# Patient Record
Sex: Female | Born: 1960 | Race: White | Hispanic: No | Marital: Single | Smoking: Never smoker
Health system: Southern US, Community
[De-identification: ages and names within clinical notes are randomized; demographics above are authoritative.]

## PROBLEM LIST (undated history)

## (undated) DIAGNOSIS — Z9989 Dependence on other enabling machines and devices: Secondary | ICD-10-CM

## (undated) DIAGNOSIS — Z87442 Personal history of urinary calculi: Secondary | ICD-10-CM

## (undated) DIAGNOSIS — G709 Myoneural disorder, unspecified: Secondary | ICD-10-CM

## (undated) DIAGNOSIS — R112 Nausea with vomiting, unspecified: Secondary | ICD-10-CM

## (undated) DIAGNOSIS — E669 Obesity, unspecified: Secondary | ICD-10-CM

## (undated) DIAGNOSIS — T8859XA Other complications of anesthesia, initial encounter: Secondary | ICD-10-CM

## (undated) DIAGNOSIS — K8020 Calculus of gallbladder without cholecystitis without obstruction: Principal | ICD-10-CM

## (undated) DIAGNOSIS — Z9889 Other specified postprocedural states: Secondary | ICD-10-CM

## (undated) DIAGNOSIS — M1990 Unspecified osteoarthritis, unspecified site: Secondary | ICD-10-CM

## (undated) DIAGNOSIS — R200 Anesthesia of skin: Secondary | ICD-10-CM

## (undated) HISTORY — DX: Calculus of gallbladder without cholecystitis without obstruction: K80.20

## (undated) HISTORY — DX: Obesity, unspecified: E66.9

## (undated) HISTORY — DX: Unspecified osteoarthritis, unspecified site: M19.90

---

## 2005-01-30 ENCOUNTER — Other Ambulatory Visit: Admission: RE | Admit: 2005-01-30 | Discharge: 2005-01-30 | Payer: Self-pay | Admitting: Obstetrics & Gynecology

## 2005-02-14 HISTORY — PX: OTHER SURGICAL HISTORY: SHX169

## 2005-03-18 ENCOUNTER — Encounter: Admission: RE | Admit: 2005-03-18 | Discharge: 2005-03-18 | Payer: Self-pay | Admitting: Obstetrics & Gynecology

## 2005-06-26 ENCOUNTER — Ambulatory Visit (HOSPITAL_COMMUNITY): Admission: RE | Admit: 2005-06-26 | Discharge: 2005-06-26 | Payer: Self-pay | Admitting: Obstetrics & Gynecology

## 2008-04-17 ENCOUNTER — Inpatient Hospital Stay (HOSPITAL_COMMUNITY): Admission: EM | Admit: 2008-04-17 | Discharge: 2008-04-21 | Payer: Self-pay | Admitting: Neurological Surgery

## 2008-04-17 HISTORY — PX: CERVICAL FUSION: SHX112

## 2008-04-19 ENCOUNTER — Ambulatory Visit: Payer: Self-pay | Admitting: Physical Medicine & Rehabilitation

## 2008-04-21 ENCOUNTER — Inpatient Hospital Stay (HOSPITAL_COMMUNITY)
Admission: RE | Admit: 2008-04-21 | Discharge: 2008-04-28 | Payer: Self-pay | Admitting: Physical Medicine & Rehabilitation

## 2008-06-14 ENCOUNTER — Encounter: Admission: RE | Admit: 2008-06-14 | Discharge: 2008-06-14 | Payer: Self-pay | Admitting: Neurological Surgery

## 2010-02-04 ENCOUNTER — Encounter
Admission: RE | Admit: 2010-02-04 | Discharge: 2010-02-04 | Payer: Self-pay | Source: Home / Self Care | Admitting: Neurosurgery

## 2010-03-05 DIAGNOSIS — M1990 Unspecified osteoarthritis, unspecified site: Secondary | ICD-10-CM

## 2010-03-05 HISTORY — DX: Unspecified osteoarthritis, unspecified site: M19.90

## 2010-07-02 LAB — BASIC METABOLIC PANEL
BUN: 14 mg/dL (ref 6–23)
BUN: 15 mg/dL (ref 6–23)
BUN: 20 mg/dL (ref 6–23)
BUN: 20 mg/dL (ref 6–23)
CO2: 23 mEq/L (ref 19–32)
CO2: 24 mEq/L (ref 19–32)
CO2: 26 mEq/L (ref 19–32)
CO2: 29 mEq/L (ref 19–32)
Calcium: 8.5 mg/dL (ref 8.4–10.5)
Calcium: 8.5 mg/dL (ref 8.4–10.5)
Calcium: 8.6 mg/dL (ref 8.4–10.5)
Calcium: 8.8 mg/dL (ref 8.4–10.5)
Chloride: 100 mEq/L (ref 96–112)
Chloride: 101 mEq/L (ref 96–112)
Chloride: 102 mEq/L (ref 96–112)
Chloride: 96 mEq/L (ref 96–112)
Creatinine, Ser: 0.46 mg/dL (ref 0.4–1.2)
Creatinine, Ser: 0.53 mg/dL (ref 0.4–1.2)
Creatinine, Ser: 0.58 mg/dL (ref 0.4–1.2)
Creatinine, Ser: 0.59 mg/dL (ref 0.4–1.2)
GFR calc Af Amer: >60 mL/min (ref >60)
GFR calc Af Amer: >60 mL/min (ref >60)
GFR calc Af Amer: >60 mL/min (ref >60)
GFR calc Af Amer: >60 mL/min (ref >60)
GFR calc non Af Amer: >60 mL/min (ref >60)
GFR calc non Af Amer: >60 mL/min (ref >60)
GFR calc non Af Amer: >60 mL/min (ref >60)
GFR calc non Af Amer: >60 mL/min (ref >60)
Glucose, Bld: 228 mg/dL — ABNORMAL HIGH (ref 70–99)
Glucose, Bld: 251 mg/dL — ABNORMAL HIGH (ref 70–99)
Glucose, Bld: 279 mg/dL — ABNORMAL HIGH (ref 70–99)
Glucose, Bld: 334 mg/dL — ABNORMAL HIGH (ref 70–99)
Potassium: 4.1 mEq/L (ref 3.5–5.1)
Potassium: 4.1 mEq/L (ref 3.5–5.1)
Potassium: 4.3 mEq/L (ref 3.5–5.1)
Potassium: 4.5 mEq/L (ref 3.5–5.1)
Sodium: 131 mEq/L — ABNORMAL LOW (ref 135–145)
Sodium: 133 mEq/L — ABNORMAL LOW (ref 135–145)
Sodium: 133 mEq/L — ABNORMAL LOW (ref 135–145)
Sodium: 136 mEq/L (ref 135–145)

## 2010-07-02 LAB — GLUCOSE, CAPILLARY
Glucose-Capillary: 123 mg/dL — ABNORMAL HIGH (ref 70–99)
Glucose-Capillary: 127 mg/dL — ABNORMAL HIGH (ref 70–99)
Glucose-Capillary: 139 mg/dL — ABNORMAL HIGH (ref 70–99)
Glucose-Capillary: 144 mg/dL — ABNORMAL HIGH (ref 70–99)
Glucose-Capillary: 149 mg/dL — ABNORMAL HIGH (ref 70–99)
Glucose-Capillary: 174 mg/dL — ABNORMAL HIGH (ref 70–99)
Glucose-Capillary: 201 mg/dL — ABNORMAL HIGH (ref 70–99)
Glucose-Capillary: 203 mg/dL — ABNORMAL HIGH (ref 70–99)
Glucose-Capillary: 213 mg/dL — ABNORMAL HIGH (ref 70–99)
Glucose-Capillary: 225 mg/dL — ABNORMAL HIGH (ref 70–99)
Glucose-Capillary: 236 mg/dL — ABNORMAL HIGH (ref 70–99)
Glucose-Capillary: 242 mg/dL — ABNORMAL HIGH (ref 70–99)
Glucose-Capillary: 243 mg/dL — ABNORMAL HIGH (ref 70–99)
Glucose-Capillary: 245 mg/dL — ABNORMAL HIGH (ref 70–99)
Glucose-Capillary: 250 mg/dL — ABNORMAL HIGH (ref 70–99)
Glucose-Capillary: 251 mg/dL — ABNORMAL HIGH (ref 70–99)
Glucose-Capillary: 268 mg/dL — ABNORMAL HIGH (ref 70–99)
Glucose-Capillary: 276 mg/dL — ABNORMAL HIGH (ref 70–99)
Glucose-Capillary: 277 mg/dL — ABNORMAL HIGH (ref 70–99)
Glucose-Capillary: 280 mg/dL — ABNORMAL HIGH (ref 70–99)
Glucose-Capillary: 290 mg/dL — ABNORMAL HIGH (ref 70–99)
Glucose-Capillary: 292 mg/dL — ABNORMAL HIGH (ref 70–99)
Glucose-Capillary: 293 mg/dL — ABNORMAL HIGH (ref 70–99)
Glucose-Capillary: 307 mg/dL — ABNORMAL HIGH (ref 70–99)
Glucose-Capillary: 310 mg/dL — ABNORMAL HIGH (ref 70–99)
Glucose-Capillary: 324 mg/dL — ABNORMAL HIGH (ref 70–99)
Glucose-Capillary: 329 mg/dL — ABNORMAL HIGH (ref 70–99)
Glucose-Capillary: 399 mg/dL — ABNORMAL HIGH (ref 70–99)

## 2010-07-02 LAB — CBC
HCT: 43.1 % (ref 36.0–46.0)
Hemoglobin: 14.8 g/dL (ref 12.0–15.0)
Hemoglobin: 16.1 g/dL — ABNORMAL HIGH (ref 12.0–15.0)
MCHC: 33.7 g/dL (ref 30.0–36.0)
MCHC: 34.1 g/dL (ref 30.0–36.0)
MCHC: 34.3 g/dL (ref 30.0–36.0)
MCV: 82.5 fL (ref 78.0–100.0)
MCV: 83.1 fL (ref 78.0–100.0)
MCV: 84.1 fL (ref 78.0–100.0)
Platelets: 237 10*3/uL (ref 150–400)
Platelets: 268 10*3/uL (ref 150–400)
RBC: 5.22 MIL/uL — ABNORMAL HIGH (ref 3.87–5.11)
RBC: 5.64 MIL/uL — ABNORMAL HIGH (ref 3.87–5.11)
RBC: 5.87 MIL/uL — ABNORMAL HIGH (ref 3.87–5.11)
RDW: 12.8 % (ref 11.5–15.5)
RDW: 13.2 % (ref 11.5–15.5)
RDW: 13.4 % (ref 11.5–15.5)
RDW: 13.4 % (ref 11.5–15.5)
WBC: 16.5 10*3/uL — ABNORMAL HIGH (ref 4.0–10.5)
WBC: 17.8 10*3/uL — ABNORMAL HIGH (ref 4.0–10.5)

## 2010-07-02 LAB — URINALYSIS, ROUTINE W REFLEX MICROSCOPIC
Bilirubin Urine: NEGATIVE
Glucose, UA: >1000 mg/dL — AB
Hgb urine dipstick: NEGATIVE
Ketones, ur: >80 mg/dL — AB
Ketones, ur: NEGATIVE mg/dL
Ketones, ur: NEGATIVE mg/dL
Leukocytes, UA: NEGATIVE
Nitrite: NEGATIVE
Nitrite: NEGATIVE
Protein, ur: NEGATIVE mg/dL
Protein, ur: NEGATIVE mg/dL
Specific Gravity, Urine: 1.03 (ref 1.005–1.030)
Urobilinogen, UA: 0.2 mg/dL (ref 0.0–1.0)
Urobilinogen, UA: 0.2 mg/dL (ref 0.0–1.0)
pH: 5.5 (ref 5.0–8.0)

## 2010-07-02 LAB — COMPREHENSIVE METABOLIC PANEL
ALT: 11 U/L (ref 0–35)
ALT: 17 U/L (ref 0–35)
AST: 24 U/L (ref 0–37)
AST: 31 U/L (ref 0–37)
Albumin: 3.3 g/dL — ABNORMAL LOW (ref 3.5–5.2)
CO2: 23 mEq/L (ref 19–32)
Calcium: 8.8 mg/dL (ref 8.4–10.5)
Calcium: 8.9 mg/dL (ref 8.4–10.5)
Creatinine, Ser: 0.58 mg/dL (ref 0.4–1.2)
Creatinine, Ser: 0.65 mg/dL (ref 0.4–1.2)
GFR calc Af Amer: >60 mL/min (ref >60)
GFR calc Af Amer: >60 mL/min (ref >60)
GFR calc non Af Amer: >60 mL/min (ref >60)
GFR calc non Af Amer: >60 mL/min (ref >60)
Sodium: 133 mEq/L — ABNORMAL LOW (ref 135–145)
Sodium: 134 mEq/L — ABNORMAL LOW (ref 135–145)
Total Protein: 6.6 g/dL (ref 6.0–8.3)
Total Protein: 6.8 g/dL (ref 6.0–8.3)

## 2010-07-02 LAB — DIFFERENTIAL
Eosinophils Absolute: 0 10*3/uL (ref 0.0–0.7)
Eosinophils Relative: 0 % (ref 0–5)
Lymphocytes Relative: 17 % (ref 12–46)
Lymphs Abs: 2.1 10*3/uL (ref 0.7–4.0)
Lymphs Abs: 2.7 10*3/uL (ref 0.7–4.0)
Monocytes Absolute: 0.7 10*3/uL (ref 0.1–1.0)
Monocytes Relative: 4 % (ref 3–12)
Monocytes Relative: 6 % (ref 3–12)
Neutro Abs: 14.9 10*3/uL — ABNORMAL HIGH (ref 1.7–7.7)
Neutrophils Relative %: 83 % — ABNORMAL HIGH (ref 43–77)

## 2010-07-02 LAB — URINE CULTURE
Colony Count: NO GROWTH
Culture: NO GROWTH

## 2010-07-02 LAB — HEMOGLOBIN A1C: Hgb A1c MFr Bld: 11.7 % — ABNORMAL HIGH (ref 4.6–6.1)

## 2010-07-02 LAB — URINE MICROSCOPIC-ADD ON

## 2010-07-02 LAB — APTT: aPTT: 26 seconds (ref 24–37)

## 2010-07-02 LAB — POCT I-STAT 4, (NA,K, GLUC, HGB,HCT)
HCT: 46 % (ref 36.0–46.0)
Hemoglobin: 15.6 g/dL — ABNORMAL HIGH (ref 12.0–15.0)

## 2010-07-02 LAB — PROTIME-INR: Prothrombin Time: 13.9 seconds (ref 11.6–15.2)

## 2010-07-30 NOTE — H&P (Signed)
[name], [name]              ACCOUNT NO.:  [id number]   MEDICAL RECORD NO.:  [id number]          PATIENT TYPE:  INP   LOCATION:  3108                         FACILITY:  [facility]   PHYSICIAN:  Ranelle Oyster, M.D.DATE OF BIRTH:  Dec 05, 1960   DATE OF ADMISSION:  04/20/2008  DATE OF DISCHARGE:                              HISTORY & PHYSICAL   CHIEF COMPLAINTS:  Weakness and history of falls.   PRIMARY CARE PHYSICIAN:  Jeannett Senior A. [place], MD   NEUROSURGEON:  Hewitt Shorts, MD   HISTORY OF PRESENT ILLNESS:  This is a 50 year old right-handed white  female with history of falls for about 2 weeks.  She noted persistent  back pain and progressive bilateral upper and lower extremity weakness.  MRI showed a large disk herniation at C5-C6 with compression of cord as  well as severe multifactorial stenosis.  Also of note was a T5-T6 disk  herniation with mild cord compression.  The patient underwent C5-C6 ACDF  on April 17, 2008, for the above by Dr. Newell Coral.  A soft cervical  collar was applied.  She is placed on IV Decadron with a plan to taper.  Hospital course has been complicated by elevated sugars related to  steroids.  Her hemoglobin A1c was 11.7 as well.  She is placed on Lantus  insulin b.i.d. and has been followed by endocrinology.  Conservative  care is recommended for thoracic disk herniation for now depending on  her course.  Rehab was consulted and felt the patient could benefit from  inpatient rehab admission and thus the patient was admitted today.   REVIEW OF SYSTEMS:  Notable for weakness and low-back pain.  She also  reports numbness in the hands and legs.  She has had improving strength.  She has a Foley catheter in.  She has had flatus, but no bowel movements  yet.  Denies shortness of breath, chest pain, etc..   PAST MEDICAL HISTORY:  Notable for morbid obesity.  She denies alcohol  or tobacco abuse.   FAMILY HISTORY:  Positive diabetes.   SOCIAL  HISTORY:  The patient lives with her mother in a basement  apartment of her mother's home.  Mother works Monday to Friday 8 to 5.  Local family work.  The patient works for [employer].   ALLERGIES:  PENICILLIN.   HOME MEDICATIONS:  None.   LABORATORY DATA:  Hemoglobin 15.6, white count 13, and platelets 235.  Sodium 133, potassium 4.1, BUN 20, creatinine 0.4.   PHYSICAL EXAMINATION:  VITAL SIGNS:  Blood pressure 128/70, pulse is 80,  respiratory rate is 18, and temperature 98.  GENERAL:  The patient is generally pleasant in no acute distress.  HEENT:  Pupils equally round and reactive to light.  Ear, nose, and  throat exam is essentially unremarkable.  Dentition notable for black-  stained teeth, which are apparently due to tetracycline use as a child.  NECK:  Supple without JVD or lymphadenopathy.  CHEST:  Clear to auscultation bilaterally without wheezes, rales, or  rhonchi.  HEART:  Regular rate and rhythm without murmurs, rubs, or gallops.  EXTREMITIES:  No clubbing, cyanosis, or trace edema.  The patient is  morbidly obese.  ABDOMEN:  Soft, nontender.  Bowel sounds are positive.  SKIN:  Generally intact with a healed wound at the anterior neck.  Cervical collar is in place.  NEUROLOGIC:  Cranial nerves II through XII were normal.  The patient has  intact swallow and speech.  Reflexes were brisk at 2+ in the upper  extremities, 2+ lower extremities today.  Sensation is decreased to 1/2  right slightly more than left.  Strength is 4/5 right upper extremity,  4+/5 left upper extremity.  Bilateral lower extremities are 2+ to 3/5  proximal, 4/5 distally.  Judgment, orientation, memory, and mood were  all within functional limits.   POST-ADMISSION PHYSICIAN EVALUATION:  1. 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  2. The patient is admitted to receive collaborative interdisciplinary      care  between the physiatrist, rehab nursing staff, and therapy      team.  3. The patient's level of medical complexity and substantial therapy      needs in context of that medical necessity cannot be provided at a      lesser intensity of care.  4. The patient has experienced substantial functional loss from her      baseline.  Upon functional assessment at that time of her      preadmission screening yesterday, the patient was min assist for      bed mobility and transfers and ambulated 40 feet at mod assist.      Upon therapy evaluation today, the patient was mod assist upper      body dressing, mod assist toileting, mod assist transfer sit to      stand from the bed, and min assist stand to sit transfers to the      recliner.  She will walk 80 feet with the rolling walker at min      assist.  Judging by the patient's diagnosis, physical exam, and      functional history, the patient has displayed the ability to make      functional progress, which will result in measurable gains while in      inpatient rehab.  These gains will be of substantial and practical      use upon discharge to home where she will need to be somewhat      independent with her mobility and self-care.  5. Physiatrist will provide 24-hour management of medical needs as      well as oversight of therapy plan/treatment and provide guidance as      appropriate regarding interaction of the two.  Medical problem list      is listed below.  6. A 24-hour rehab nursing will assist in the management of the      patient's bowel and bladder function.  They will remove Foley      catheter upon transfer to rehab.  Nursing will also help integrate      therapy concepts, techniques, education, etc.  7. PT will assess and treat for lower extremity strengthening,      balance, appropriate adaptive equipment, and safety.  Goals are      modified independent.  8. OT will assess and treat for upper extremity use, dexterity of       hands, strengthening of the upper extremity, adaptive equipment,      assessment and treatment  as well as family education with goals at      supervision to modified independent.  9. Case management and social worker will assess and treat for      psychosocial issues and discharge planning.  10.Team conference will be held weekly to assess progress towards      goals and to determine barriers to discharge.  11.The patient has demonstrated sufficient medical stability and      exercise capacity to tolerate at least 3 hours therapy per day at      least 5 days per week.  12.Estimated length of stay is approximately 1 week.  Prognosis is      good.   MEDICAL PROBLEM LIST:  1. Potential neurogenic bladder.  Discontinue Foley catheter.  Begin      voiding trial.  Check PVRs and catheterize as needed.  The patient      is at further risk for neurogenic bladder considering her diabetes,      which has been poorly controlled.  2. Diabetes:  Continue with Lantus insulin for now per Endocrine      recommendations and adjust appropriately.  Check CBGs a.c. and at      bedtime.  3. Pain control:  Continue Vicodin p.r.n. for now.  This seems to be      under somewhat good control at the moment.  She does have the      possibility of developing spasms and will have antispasmodics on      board as needed.  4. DVT prophylaxis with subcu Lovenox 40 daily for now until mobility      improves a bit more.      Ranelle Oyster, M.D.  Electronically Signed     ZTS/MEDQ  D:  04/20/2008  T:  04/21/2008  Job:  [id number]

## 2010-07-30 NOTE — H&P (Signed)
NAMEALBINA, [name]              ACCOUNT NO.:  [id number]   MEDICAL RECORD NO.:  [id number]          PATIENT TYPE:  INP   LOCATION:  3108                         FACILITY:  [facility]   PHYSICIAN:  Stefani Dama, M.D.  DATE OF BIRTH:  [birth year]   DATE OF ADMISSION:  04/17/2008  DATE OF DISCHARGE:                              HISTORY & PHYSICAL   ADMITTING DIAGNOSIS:  Paraparesis.   HISTORY OF PRESENT ILLNESS:  [name] is a 50 year old right-  handed white female, who noted the sudden onset of bilateral lower  extremity weakness yesterday afternoon after bathing.  She went to get  up after a short nap around 5 or 6 in the evening and noted weakness in  her lower extremities such that her legs felt that they would buckle,  her right leg distinctly felt weaker.  She denied any pain in her back  and she felt a strange sensation of numbness in her legs that radiated  from the mid level of her abdomen on down.  She summoned EMS and was  taken to [facility] Emergency Room where a CT scan showed spinal  stenosis.  The patient's mother requested neurosurgical evaluation with  Dr. Newell Coral, she is a former patient of his.  Now is contacted  approximately 1 a.m. by Dr. Littie Deeds indicating the patient's desire to be  transferred to [facility] for specific treatment and further  workup.  Prior to the transfer, the patient had a Foley catheter placed,  which drained 1600 mL of urine.  Rectal exam by Dr. Littie Deeds and per Dr.  Littie Deeds revealed normal tone in the rectum and a per the patient's  comment, she noted normal sensation in the perirectal area.   The patient denied any significant associated symptoms other than noting  that she had had a fall about 2 weeks ago.  She fell forward at that  time and did experience some slight back pain, but did not note anything  specific about that, her strength at that time was normal.  The patient  denies any travel.  She denies any  infectious processes that are  recently passed such as a bad cold or fever she notes that about a month  and a half ago.  Just before Christmas, she had had a significant cold,  but this only produced respiratory and upper respiratory symptoms.  The  patient also denies the onset of symptoms as going from distal to  proximal.  She notes that the change was rather sudden.  She did perhaps  get some sensation difference before her bath yesterday.  However, she  notes that she did not think anything of it and the change came about  rather suddenly when she tried to get up after a nap.   PAST MEDICAL HISTORY:  She is healthy with morbid obesity, and states  her height of 5 feet 3 and 280 pounds.   ALLERGIES:  She notes an allergy to PENICILLIN, which she tells me, was  told to her after she failed treatment with penicillin-like drug for  urinary tract infection.  She  denied any rash or fevers, but she was  told never to take penicillin again.   MEDICATIONS:  She is on no chronic medications.   SOCIAL HISTORY:  She is single.  She lives in an apartment in her  mother's house.  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   REVIEW OF SYSTEMS:  Negative for an 14 different systems checked and  discussed with the patient.   PHYSICAL EXAMINATION:  VITAL SIGNS:  At the current time, blood pressure  is 130/72, pulse rate 66, respirations are 20, temperature is 98.  GENERAL:  She is alert and oriented.  HEENT:  Her pupils are 3 mm equal, brisk, and reactive to light and  accommodation.  Extraocular movements are full.  Face is symmetric to  sensation.  Tongue and uvula are in the midline.  NECK:  Supple.  No masses are noted.  LUNGS:  Clear to auscultation.  HEART:  Regular rate and rhythm.  No murmurs noted.  ABDOMEN:  Soft.  Bowel sounds are positive.  No masses are noted.  EXTREMITIES:  No cyanosis, clubbing, or edema.  There is some tenderness  in the lower  lumbar spine approximately level of L3 on down.  This is  both to palpation and slightly to percussion.  On motor examination, her  upper extremity strength is normal in all the groups tested.  Her lower  extremities reveals that there is some weakness in the iliopsoas and the  quad graded at -4/5 on the right side and there is some slight weakness  suggested in the tibialis anterior and the right side being graded 4+/5,  gastroc strength is 5/5.  Left lower extremity strength is 5/5 in the  iliopsoas, quad tibialis anterior, and gastrocs.  Sensation to pin is  decreased in the L4, L5, and S1 distributions on the left lower  extremity.  She complains of some diffuse decrease in sensation below  the level of the thoracic spine over the entirety of the abdomen left  and right, although she does have pin sensation intact.  Her reflexes  are 2+ in the biceps, 1+ in both triceps, 2+ in the patella, trace in  the Achilles, Babinski reflexes appeared downgoing at this time.   IMPRESSION:  1. 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  This is suggestion of T-spine      sensory level of T10 with a mild decrease in sensation of both      sides of the abdomen on down.  2. The patient has evidence of bladder dysfunction with retention of      urine of 1600 mL.  3. The patient has possibly new onset of diabetes mellitus with a      glucose noted of 399 after 10 mg of Decadron.   PLAN:  1. Plan at the current time will be to admit the patient for further      workup with an MRI of the lumbar and the thoracic spines.  2. Maintain a Foley catheter for urinary drainage.  3. Workup of diabetes with HbA1c and provide insulin coverage.      Stefani Dama, M.D.  Electronically Signed     HJE/MEDQ  D:  04/17/2008  T:  04/17/2008  Job:  [id number]

## 2010-07-30 NOTE — Discharge Summary (Signed)
[name], [name]              ACCOUNT NO.:  [id number]   MEDICAL RECORD NO.:  [id number]          PATIENT TYPE:  IPS   LOCATION:  4006                         FACILITY:  [facility]   PHYSICIAN:  Ranelle Oyster, M.D.DATE OF BIRTH:  01-Feb-1961   DATE OF ADMISSION:  04/21/2008  DATE OF DISCHARGE:  04/28/2008                               DISCHARGE SUMMARY   DISCHARGE DIAGNOSES:  1. 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  2. 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  3. Neurogenic bladder.  4. New onset diabetes mellitus.  5. Morbid obesity.  6. Pain management.  7. Subcutaneous Lovenox for deep vein thrombosis prophylaxis.   This is a 50 year old right-handed white female with history of fall 2  weeks ago and fell forward with no loss of consciousness.  Noted  persistent back pain and progressive bilateral lower extremity weakness.  Admitted on April 17, 2008.  MRI and imaging showed a large disk  herniation cervical C5-6 with cord compression as well as severe  stenosis.  Also, thoracic T5-6 disk herniation with mild cord  compression.  Underwent cervical C5-6 ACDF on April 17, 2008, for disk  herniation and myelopathy per Dr. Newell Coral.  Soft cervical collar was  applied.  Placed on intravenous Decadron.   HOSPITAL COURSE:  Blood sugars 293, 324, 280.  Hemoglobin A1c of 11.7,  placed on Lantus insulin as well as consultation to Endocrine Services,  Dr. Evlyn Kanner, for assistance in new onset diabetes mellitus.  Monitoring of  thoracic T5-6 disk herniation with conservative care may need surgical  intervention in the future per Neurosurgery.  The patient was admitted  for comprehensive rehab program.   PAST MEDICAL HISTORY:  See discharge diagnoses.  No alcohol or tobacco.   ALLERGIES:  Penicillin.   SOCIAL HISTORY:  Lives with mother in  basement apartment of mother's  home.  Mother works Monday to Friday 8-5.  Other local family works.  The patient works for [employer].   FUNCTIONAL HISTORY:  Prior to admission, was independent.  Functional  status upon admission to rehab services; moderate assist for upper body  dressing, moderate assist toileting, transfers sit to stand, moderate  assist, standing to sitting requires minimal assist, ambulating minimal  assist 80 feet with a rolling walker.   MEDICATION PRIOR TO ADMISSION:  None.   PHYSICAL EXAMINATION:  VITAL SIGNS:  Blood pressure 128/70, pulse 80,  temperature 98, respirations 18.  GENERAL:  This was an alert female in no acute distress, oriented x3.  MUSCULOSKELETAL:  Deep tendon reflexes were hypoactive.  Sensation  decreased to light touch, right greater than left between the shoulders.  Soft cervical collar intact.  Surgical site healing nicely.  Calves  remained cool without any swelling or erythema, nontender.  LUNGS:  Clear to auscultation.  CARDIAC:  Regular rate and rhythm.  ABDOMEN:  Soft, nontender.  Good bowel sounds.   REHABILITATION HOSPITAL COURSE:  The patient was admitted to inpatient  rehab services with therapies initiated on a 3-hour daily basis  consisting of physical therapy, occupational therapy, and rehabilitation  nursing.  The following issues were addressed during the patient's  rehabilitation stay:  Pertaining to Ms. Brodowski's cervical HNP, cord  compression myelopathy, she underwent ACDF, cervical C5-6 on April 17, 2008, per Dr. Newell Coral as well as conservative care and monitoring of a  thoracic T5-6 disk herniation with mild cord compression.  She continued  to participate fully with her therapies, progressing nicely in all  areas.  Pain management ongoing with the use of Vicodin and good  results.  Decadron taper ongoing as dictated per Neurosurgery.  Her  Foley catheter tube had been removed.  She initially had  some increased  postvoid residuals.  These continued to improve as her mobility  improved.  She denied any dysuria or hematuria.  New onset of diabetes  mellitus assistance as per Endocrine Services, Dr. Evlyn Kanner.  Full diabetic  teaching was completed.  She remained on Lantus insulin 50 units twice  daily.  This would need to be adjusted as her Decadron taper was  completed.  She remained on subcutaneous Lovenox for deep vein  thrombosis prophylaxis throughout her rehab course.  Her blood pressures  were well controlled without the use of antihypertensive medication.   Weekly collaborative interdisciplinary team conferences were held to  discuss the patient's estimated length of stay, ongoing family teaching,  and any barriers to discharge.  She was overall supervision to minimal  assist for her mobility, supervision overall for activities of daily  living, moderate assist for bathing.  She exhibited no unsafe behavior.  She was continent of bowel and bladder.  A soft cervical collar remained  in place as per Neurosurgery.  She was allowed to remove this for her  bathing.   Latest labs showed a hemoglobin of 16.1, hematocrit of 42, monitoring of  white blood cell count of 17.4 secondary to steroids.  Chemistries were  unremarkable.  She was discharged to home.   Discharge medications included:  1. Protonix 40 mg daily until Decadron taper completed.  2. Aspirin 81 mg daily.  3. Lantus insulin 50 units subcutaneously twice daily.  4. Decadron taper as per Neurosurgery to be completed on May 07, 2008.  5. Vicodin 5/325 one or 2 tablets every 4 hours as needed for pain,      dispense of 90 tablets.   Her diet was diabetic diet.   SPECIAL INSTRUCTIONS:  No drinking, no driving, no smoking.  Continue  soft cervical collar as advised per Neurosurgery.  Follow up Dr. Faith Rogue at the outpatient rehab service office as advised; Dr. Newell Coral,  Neurosurgery [phone]; and Dr.  Evlyn Kanner, Endocrine Services for new onset  diabetes mellitus.      Mariam Dollar, P.A.      Ranelle Oyster, M.D.  Electronically Signed    DA/MEDQ  D:  04/27/2008  T:  04/27/2008  Job:  [id number]   cc:   Jeannett Senior A. Evlyn Kanner, M.D.  Hewitt Shorts, M.D.

## 2010-07-30 NOTE — Discharge Summary (Signed)
[name], [name]              ACCOUNT NO.:  [id number]   MEDICAL RECORD NO.:  [id number]          PATIENT TYPE:  INP   LOCATION:  3108                         FACILITY:  [facility]   PHYSICIAN:  Hewitt Shorts, M.D.DATE OF BIRTH:  10/18/1960   DATE OF ADMISSION:  04/17/2008  DATE OF DISCHARGE:  04/21/2008                               DISCHARGE SUMMARY   ADMISSION HISTORY AND PHYSICAL EXAMINATION:  The patient is a 50-year-  old right-handed white female who was accepted and transferred from  [facility] Emergency Room by Dr. Barnett Abu, with acute  paraparesis with a Brown-Sequard syndrome.  Dr. Danielle Dess evaluated her and  felt that she had a thoracic sensory level.  She had a evidence of  bladder dysfunction and had a Foley placed in the [facility]  Emergency Room for 1600 mL.  Her blood sugar when EMS picked her up at  home was 319 and was 399 in the [facility] emergency room.   She reports having fallen about 2 weeks prior to presentation.  Following the fall, she had increased cramping in her legs and numbness  and tingling in her feet at night.  However on the day of presentation,  she took a shower and then a nap and when she got up from the nap, she  fell to the floor with weakness in her lower extremities.  EMS was  called, and she was taken to the emergency room.  A CT was done of the  lumbar spine.  However, the information from that was quite limited, and  the patient was transferred from [facility] to [facility].   The patient was evaluated by Dr. Danielle Dess.  General examination was  notable for morbid obesity.  He found weakness in the right lower  extremity greater than the left lower extremity with sensory deficit  greater in the left lower extremity than in the right lower extremity  consistent with Brown-Sequard syndrome.  He found a thoracic sensory  level that he estimated T10.   HOSPITAL COURSE:  The patient was admitted  by Dr. Danielle Dess.  He asked me  to assist in her management and to assume her care.  He obtained MRI  scans of the thoracic and lumbar spine.  The lumbar spine showed  degenerative changes, but no significant neural compression.  The  thoracic spine showed a significant disk herniation at T5-6 with some  cord compression, but no alteration of cord signal, but with multiple  other levels in the thoracic spine with significant degenerative changes  as well.  However, the scout films showed what appeared to be a  significant C5-6 cervical disk herniation, Dr. Danielle Dess requested an MRI  scan of the cervical spine.   I assessed the patient at his request, the patient was started on  Decadron at [facility] Emergency Room and continued on Decadron.  Subsequent to transfer, she was found to have significant hyperglycemia  and was started on sliding scale insulin schedule.  My examination  revealed a quadriparesis with weakness in the right triceps and an  intrinsics and grips  bilaterally, worse in the right than the left, with  proximal right lower extremity weakness as well and diminished sensation  of the distal left lower extremity and an incomplete sensory level at  approximately T8.  She had hyperreflexia at the quadriceps and sustained  clonus at the right ankle with upgoing toes bilaterally.   We subsequently reviewed her cervical MRI it showed a large C5-6  cervical disk herniation with spinal cord compression worse on the right  than the left with increased signal in the spinal cord consistent with  myelopathy.   In the end it was felt that she might well be symptomatic both from the  C5-6 cervical disk herniation as well as from the T5-6 thoracic disc  herniation.  However, it appeared that the compression was worse at C5-6  than at T5-6, and there was definite increased signal in the spinal cord  at C5-6 and questionable increased signal in the cord at T5-6 and the  exam was  notable for quadriparesis.  Therefore, it was recommended that  we proceed with decompression of the spinal cord at the C5-6 level via  single level, C5-6 anterior cervical decompression and arthrodesis  understanding that she might subsequently require thoracotomy for T5-6  diskectomy, and we discussed at length the nature of her condition and  the nature of proposed surgery the risks of the procedure and the risks  of going without surgery.  The patient wished to go ahead with surgery  and was taken on the evening of her admission to surgery for a single-  level C5-6 anterior cervical decompression arthrodesis with allograft  and cervical plating.  Postoperatively, she continued to have  significant hyperglycemia despite the sliding scale.  She did not have a  primary physician prior to hospitalization and asked that I consult a  primary physician in [place] to assist with her general medical care  as well as her diabetic management, and I consulted with Dr. Adrian Prince, he agreed to see the patient in consultation in the hospital and  has continued to follow her subsequently, and he has been assisting with  her diabetic management in the hospital, and her blood sugar control has  gradually improved such that on the day of transfer to the  rehabilitation center her blood sugar in the morning was 225 which is  the best we had since hospitalization.  Neurologically, she has had  improvement with increasing strength in the upper extremities and lower  extremities.   Physical Therapy and Occupational Therapy have been consulted and they  have been working with the patient; however, she still has significant  limitations and we therefore consulted Physical Medicine and  Rehabilitation about comprehensive inpatient rehabilitation.  They felt  that she was an appropriate candidate for inpatient rehabilitation and  after extensive discussions with her [facility] carrier,   inpatient rehabilitation was approved, and the patient is to be  transferred today to the Rehabilitation Center.   Her wound is healing nicely, there is no erythema, swelling, or  drainage.  She continued to have Foley in place and the plan will be for  the Rehabilitation Service to work on bladder training.  Dr. Evlyn Kanner and  his partners will continue to assist with her medical and diabetic  management.  Notably, her hemoglobin A1c at the time of admission was  11.7 indicating long-term chronic hyperglycemia consistent with diabetes  mellitus previously not diagnosed.  The patient is being transferred to  the Rehabilitation  Center.  She has been started on Lovenox 40 mg subcu  q.12 h.  She is eating well.  Her IV has been tapered off.   DISCHARGE DIAGNOSES:  1. Acute cervical myelopathy with quadriparesis secondary to an acute      C5-6 cervical disk herniation with significant spinal cord      compression with underlying spondylosis at that level with      increased signal seen in the spinal cord at that level consistent      with her myelopathy with quadriparesis and urinary retention.  2. Also significant T5-6 thoracic disk herniation with spinal cord      compression, although questionable increased signal in the spinal      cord at that level.  She does have an incomplete sensory level      approximately at T8.  3. Diabetes mellitus, new diagnosis, currently under the evaluation,      treatment, and care of Dr. Adrian Prince  4. Morbid obesity.      Hewitt Shorts, M.D.  Electronically Signed     RWN/MEDQ  D:  04/21/2008  T:  04/22/2008  Job:  [id number]   cc:   Jeannett Senior A. Evlyn Kanner, M.D.

## 2010-07-30 NOTE — Op Note (Signed)
[name], [name]              ACCOUNT NO.:  [id number]   MEDICAL RECORD NO.:  [id number]          PATIENT TYPE:  INP   LOCATION:  3108                         FACILITY:  [facility]   PHYSICIAN:  Hewitt Shorts, M.D.DATE OF BIRTH:  1960/12/26   DATE OF PROCEDURE:  04/17/2008  DATE OF DISCHARGE:                               OPERATIVE REPORT   PREOPERATIVE DIAGNOSES:  1. 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  2. 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  3. Cervical stenosis and quadriparesis.   POSTOPERATIVE DIAGNOSES:  1. 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  2. 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  3. Cervical stenosis and quadriparesis.   PROCEDURE:  C5-6 anterior cervical decompression arthrodesis with  allograft and Tether cervical plating.   SURGEON:  Hewitt Shorts, MD   ASSISTANTS:  Danae Orleans. Venetia Maxon, M.D.   ANESTHESIA:  General endotracheal.   INDICATION:  The patient is a 50 year old woman who presented with  quadriparesis.  She underwent an extensive workup including MRI of  cervical, thoracic, and lumbar spine.  She was found to have large disk  herniation at C5-6 with asymmetric compression of the spinal cord, worse  on the right than the left, increasing within the spinal cord and severe  spinal cord compression and cervical stenosis.  She was also found to  have a significant disk herniation at T5-6 with spinal cord compression  but not as severe as that she had at C5-6 and therefore decision was  made to proceed with a single level anterior cervical decompression  arthrodesis.   PROCEDURE:  The patient was brought to the operating room and placed  under general endotracheal anesthesia.  The patient was placed in 10  pounds of Halter traction and then the neck was prepped with Betadine  soap and solution, draped in a sterile fashion.  A horizontal incision  was made on the left side of the neck.  The line of the incision was  infiltrated with local anesthetic with epinephrine.  An incision made,  carried down through the subcutaneous tissue, bipolar cautery and  electrocautery were used to maintain hemostasis.  Dissection was carried  through the platysma and then through the avascular plane leaving the  sternocleidomastoid, carotid artery, and jugular vein laterally, and the  trachea and esophagus medially.  The ventral aspect of the vertebral  column was identified and localizing x-rays were taken with 2 spinal  needles.  With these needles, we were able to identify the C3-4 and C4-5  levels and thereby we were  able to dissect caudally and identify the C5-  6 intervertebral disk space level.   The microscope was then draped and brought into the field to provide  additional magnification, illumination, and visualization and we  proceeded with the C5-6 anterior cervical decompression.  By incising  the annulus, we continued entering into the disk space using micro  curettes and pituitary rongeurs, a thorough diskectomy was performed.  Anterior osteophytic overgrowth was removed using Kerrison punches, the  osteophyte removal tool, and X-Max drill.  There was significant  spondylitic degeneration.  The disk was quite  degenerated and the  cauterized endplates of the vertebral body were removed using the X-Max  drill along with micro curettes and dissection was carried out  posteriorly with a significant spondylitic spurring.  This was removed  using X-Max drill and a 2-mm Kerrison punch with a thin foot plate.  Soft as well as hard spondylitic cervical disk herniation was  encountered and this was carefully removed to decompress the thecal sac  and spinal canal.  We carefully removed the spondylotic overgrowth and  decompressed the thecal sac and spinal canal.  We turned our attention  to the neural foramina, which was similarly decompressed bilaterally.  Once the decompression was completed, hemostasis was  established with  the use of Gelfoam soaked in thrombin.  We measured the height of the  intervertebral disk space and selected an 8-mm implant.  The allograft  implant was hydrated with saline solution, then positioned in the  intervertebral disk space, and countersunk.   We then discontinued the cervical traction and proceeded with the  plating.  We selected a 14-mm Tether cervical plate, it was positioned  over the construct and secured with a pair of 4 x 13-mm variable angle  screws at each level.  The screw hole was drilled and the screws placed  in alternating fashion.  Once all 4 screws were in place, final  tightening was performed.  Visualization because of the patient's large-  sized shoulders was obscured.  We visualized the construct under direct  visualization.  Bone graft was secured in position, plate and screws are  all in good position.  The wound was irrigated with Bacitracin solution.  We checked for hemostasis, which was established and confirmed, and then  we proceeded with closure.  Platysma was closed with interrupted  inverted 2-0 undyed Vicryl sutures.  The subcutaneous and subcuticular  were closed with interrupted inverted 3-0 undyed Vicryl sutures.  Skin  edges were reapproximated with Dermabond.  The procedure was tolerated  well.  The estimated blood loss was 25 mL.  Sponge and needle count were  correct.  Following surgery, the patient is to be reversed from the  anesthetic, extubated, and transferred to the recovery room or in  Neurosurgical Intensive Care for further care.      Hewitt Shorts, M.D.  Electronically Signed     RWN/MEDQ  D:  04/17/2008  T:  04/19/2008  Job:  [id number]

## 2011-05-31 ENCOUNTER — Encounter (HOSPITAL_COMMUNITY): Payer: Self-pay | Admitting: *Deleted

## 2011-05-31 ENCOUNTER — Emergency Department (HOSPITAL_COMMUNITY): Payer: BC Managed Care – PPO

## 2011-05-31 ENCOUNTER — Emergency Department (HOSPITAL_COMMUNITY)
Admission: EM | Admit: 2011-05-31 | Discharge: 2011-05-31 | Disposition: A | Discharge status: Home / Self Care | Payer: BC Managed Care – PPO | Attending: Emergency Medicine | Admitting: Emergency Medicine

## 2011-05-31 ENCOUNTER — Other Ambulatory Visit: Payer: Self-pay

## 2011-05-31 DIAGNOSIS — R10816 Epigastric abdominal tenderness: Secondary | ICD-10-CM | POA: Insufficient documentation

## 2011-05-31 DIAGNOSIS — K8020 Calculus of gallbladder without cholecystitis without obstruction: Secondary | ICD-10-CM | POA: Insufficient documentation

## 2011-05-31 DIAGNOSIS — E119 Type 2 diabetes mellitus without complications: Secondary | ICD-10-CM | POA: Insufficient documentation

## 2011-05-31 DIAGNOSIS — Z7982 Long term (current) use of aspirin: Secondary | ICD-10-CM | POA: Insufficient documentation

## 2011-05-31 DIAGNOSIS — R0789 Other chest pain: Secondary | ICD-10-CM | POA: Insufficient documentation

## 2011-05-31 DIAGNOSIS — R109 Unspecified abdominal pain: Secondary | ICD-10-CM | POA: Insufficient documentation

## 2011-05-31 DIAGNOSIS — R079 Chest pain, unspecified: Secondary | ICD-10-CM | POA: Insufficient documentation

## 2011-05-31 HISTORY — DX: Calculus of gallbladder without cholecystitis without obstruction: K80.20

## 2011-05-31 LAB — DIFFERENTIAL
Lymphocytes Relative: 28 % (ref 12–46)
Lymphs Abs: 3.5 10*3/uL (ref 0.7–4.0)
Neutro Abs: 8.2 10*3/uL — ABNORMAL HIGH (ref 1.7–7.7)
Neutrophils Relative %: 66 % (ref 43–77)

## 2011-05-31 LAB — CARDIAC PANEL(CRET KIN+CKTOT+MB+TROPI): Relative Index: 2.1 (ref 0.0–2.5)

## 2011-05-31 LAB — COMPREHENSIVE METABOLIC PANEL
ALT: 10 U/L (ref 0–35)
Alkaline Phosphatase: 73 U/L (ref 39–117)
CO2: 25 mEq/L (ref 19–32)
GFR calc Af Amer: >90 mL/min (ref >90)
GFR calc non Af Amer: >90 mL/min (ref >90)
Glucose, Bld: 110 mg/dL — ABNORMAL HIGH (ref 70–99)
Potassium: 3.8 mEq/L (ref 3.5–5.1)
Sodium: 136 mEq/L (ref 135–145)
Total Protein: 6.9 g/dL (ref 6.0–8.3)

## 2011-05-31 LAB — CBC
Platelets: 267 10*3/uL (ref 150–400)
RBC: 5.04 MIL/uL (ref 3.87–5.11)
WBC: 12.5 10*3/uL — ABNORMAL HIGH (ref 4.0–10.5)

## 2011-05-31 MED ORDER — GI COCKTAIL ~~LOC~~
30.0000 mL | Freq: Once | ORAL | Status: AC
  Administered 2011-05-31: 30 mL via ORAL
  Filled 2011-05-31: qty 30

## 2011-05-31 NOTE — ED Notes (Signed)
The pt has had lt upper chest pain for 2 hours  No sob nor v.  She describes the pain as a burning

## 2011-05-31 NOTE — ED Provider Notes (Addendum)
History     CSN: [id number]  Arrival date & time 05/31/11  0041   First MD Initiated Contact with Patient 05/31/11 0144      Chief Complaint  Patient presents with  . Chest Pain    epigastric burning  . Abdominal Pain    (Consider location/radiation/quality/duration/timing/severity/associated sxs/prior treatment) Patient is a 51 y.o. female presenting with chest pain and abdominal pain. The history is provided by the patient.  Chest Pain The chest pain began 6 - 12 hours ago. Duration of episode(s) is 6 hours. Chest pain occurs constantly. The chest pain is unchanged. At its most intense, the pain is at 4/10. The pain is currently at 4/10. The severity of the pain is moderate. The quality of the pain is described as burning. The pain does not radiate. Chest pain is worsened by certain positions (lying down). Primary symptoms include abdominal pain. Pertinent negatives for primary symptoms include no shortness of breath, no cough, no wheezing, no palpitations, no nausea, no vomiting and no dizziness.  Pertinent negatives for associated symptoms include no diaphoresis, no lower extremity edema and no weakness. She tried nothing for the symptoms. Risk factors include obesity.  Her past medical history is significant for diabetes.  Pertinent negatives for past medical history include no hyperlipidemia and no hypertension.  Her family medical history is significant for CAD in family and heart disease in family.  Procedure history is negative for cardiac catheterization and echocardiogram.    Abdominal Pain The primary symptoms of the illness include abdominal pain. The primary symptoms of the illness do not include shortness of breath, nausea or vomiting.  Symptoms associated with the illness do not include diaphoresis. Significant associated medical issues include diabetes.    Past Medical History  Diagnosis Date  . Diabetes mellitus     History reviewed. No pertinent past surgical  history.  History reviewed. No pertinent family history.  History  Substance Use Topics  . Smoking status: Never Smoker   . Smokeless tobacco: Not on file  . Alcohol Use: No    OB History    Grav Para Term Preterm Abortions TAB SAB Ect Mult Living                  Review of Systems  Constitutional: Negative for diaphoresis.  Respiratory: Negative for cough, shortness of breath and wheezing.   Cardiovascular: Positive for chest pain. Negative for palpitations.  Gastrointestinal: Positive for abdominal pain. Negative for nausea and vomiting.  Neurological: Negative for dizziness and weakness.  All other systems reviewed and are negative.    Allergies  Penicillins  Home Medications   Current Outpatient Rx  Name Route Sig Dispense Refill  . ASPIRIN EC 81 MG PO TBEC Oral Take 81 mg by mouth once.    . CELECOXIB 200 MG PO CAPS Oral Take 200 mg by mouth daily as needed. For arthritis pain    . BYDUREON [place] Subcutaneous Inject 1 mL into the skin every 7 (seven) days. Saturday or sunday    . METFORMIN HCL 500 MG PO TABS Oral Take 1,000 mg by mouth 2 (two) times daily with a meal.      BP 160/79  Pulse 85  Temp(Src) 98.4 F (36.9 C) (Oral)  Resp 16  SpO2 99%  Physical Exam  Nursing note and vitals reviewed. Constitutional: She is oriented to person, place, and time. She appears well-developed and well-nourished. No distress.  HENT:  Head: Normocephalic and atraumatic.  Mouth/Throat: Oropharynx is clear  and moist.  Eyes: EOM are normal. Pupils are equal, round, and reactive to light.  Cardiovascular: Normal rate, regular rhythm, normal heart sounds and intact distal pulses.  Exam reveals no friction rub.   No murmur heard. Pulmonary/Chest: Effort normal and breath sounds normal. She has no wheezes. She has no rales. She exhibits tenderness.    Abdominal: Soft. Bowel sounds are normal. She exhibits no distension. There is tenderness in the epigastric area. There is no  rebound and no guarding.  Musculoskeletal: Normal range of motion. She exhibits no tenderness.       No edema  Neurological: She is alert and oriented to person, place, and time. No cranial nerve deficit.  Skin: Skin is warm and dry. No rash noted.  Psychiatric: She has a normal mood and affect. Her behavior is normal.    ED Course  Procedures (including critical care time)   Labs Reviewed  CBC  DIFFERENTIAL  COMPREHENSIVE METABOLIC PANEL  CARDIAC PANEL(CRET KIN+CKTOT+MB+TROPI)   Dg Chest 2 View  05/31/2011  *RADIOLOGY REPORT*  Clinical Data: Sternal chest pain.  History of diabetes.  CHEST - 2 VIEW  Comparison: None.  Findings: The lungs are well-aerated.  Mild likely chronic peribronchial thickening is noted.  There is no evidence of focal opacification, pleural effusion or pneumothorax.  A small calcified granuloma is noted at the right midlung zone.  The heart is normal in size; the mediastinal contour is within normal limits.  No acute osseous abnormalities are seen.  Cervical fusion hardware is noted.  IMPRESSION: No acute cardiopulmonary process seen.  Original Report Authenticated By: Tonia Ghent, M.D.   US Abdomen Complete  05/31/2011  *RADIOLOGY REPORT*  Clinical Data:  Abdominal pain.  ABDOMINAL ULTRASOUND COMPLETE  Comparison:  None  Findings:  Gallbladder:  A wall echo shadow sign is noted, with multiple stones filling the gallbladder.  No gallbladder wall thickening is seen; no ultrasonographic Murphy's sign is elicited.  No pericholecystic fluid is identified.  Common Bile Duct:  0.5 cm in diameter; within normal limits in caliber.  Liver:  Diffusely increased hepatic echogenicity and coarsened echotexture, compatible with fatty infiltration; no focal lesions identified.  The liver is mildly enlarged.  Limited Doppler evaluation demonstrates normal blood flow within the liver.  IVC:  Unremarkable in appearance.  Pancreas:  Although the pancreas is difficult to visualize in its  entirety due to overlying bowel gas, no focal pancreatic abnormality is identified.  Spleen:  9.9 cm in length; within normal limits in size and echotexture.  Right kidney:  12.3 cm in length; normal in size, configuration and parenchymal echogenicity.  No evidence of mass or hydronephrosis.  Left kidney:  12.6 cm in length; normal in size, configuration and parenchymal echogenicity.  No evidence of mass or hydronephrosis.  Abdominal Aorta:  Normal in caliber; no aneurysm identified.  IMPRESSION:  1.  Numerous stones filling the gallbladder, with a wall echo shadow sign; no evidence for obstruction or cholecystitis. 2.  Diffuse fatty infiltration within the liver; mild hepatomegaly.  Original Report Authenticated By: Tonia Ghent, M.D.     1. Gallstones       MDM   Pt with atypical story for CP that started at 8pm tonight.  TIMI 1 for ASA daily and 2 risk factors DM and family hx.  PERC neg.  EKG wnl.  CXR, CBC, CMP, CE, abdominal ultrasound pending.  Patient also has epigastric tenderness and was given a GI cocktail. Patient states that years ago she was  diagnosed with gallstones and never had anything done about them. Patient's story is very atypical for cardiac cause of her chest pain it is worse with lying flat started several hours after eating and has epigastric pain on exam now. Since pain has been ongoing now for 6 hours without resolve if it were cardiac feel that the cardiac enzymes will be elevated. Feel most likely this is from a GI component.  4:55 AM After GI cocktail patient's symptoms are much improved. All lab tests are within normal limits other than a mild leukocytosis of 12,000. Chest x-ray within normal limits but ultrasound of the abdomen shows numerous stones in the gallbladder without signs of cholecystitis. Information was relayed to the patient's and she was given followup with Gen. surgery and she'll followup with Dr. Evlyn Kanner on Monday.       Gwyneth Sprout,  MD 05/31/11 9604  Gwyneth Sprout, MD 05/31/11 [phone]

## 2011-05-31 NOTE — ED Notes (Signed)
Pt states that she noticed a Chest discomfort 2 hours ago. Pt states pain is burning and center chest. Pt denies N/V/D. Pt denies SOB. Pt states that she ate around 18:00. Pt states she went to her PCP for regular workup and was complaining of some left shoulder pain at that time. Pt is alert and oriented and able to follow commands and move extremities.

## 2011-05-31 NOTE — Discharge Instructions (Signed)
Gallstones Gallstones are a form of gallbladder disease. The gallbladder is a small organ that helps you digest food.  HOME CARE  Only take medicine as told by your doctor.   Eat a low-fat diet.   Follow up as told.  GET HELP RIGHT AWAY IF:   Your pain gets worse.   You develop yellow skin or eyes (jaundice).   The pain moves to another part of your belly (abdomen) or back.   You have a temperature by mouth above 102 F (38.9 C), not controlled by medicine.   You feel sick to your stomach (nauseous) and throw up (vomit).  MAKE SURE YOU:   Understand these instructions.   Will watch your condition.   Will get help right away if you are not doing well or get worse.  Document Released: 08/20/2007 Document Revised: 02/20/2011 Document Reviewed: 02/06/2009 [employer] Patient Information 2012 [employer]. 

## 2011-06-09 ENCOUNTER — Ambulatory Visit (INDEPENDENT_AMBULATORY_CARE_PROVIDER_SITE_OTHER): Payer: BC Managed Care – PPO | Admitting: Surgery

## 2011-06-09 ENCOUNTER — Encounter (INDEPENDENT_AMBULATORY_CARE_PROVIDER_SITE_OTHER): Payer: Self-pay | Admitting: Surgery

## 2011-06-09 VITALS — BP 124/82 | HR 96 | Temp 98.0°F | Resp 20 | Ht 63.0 in | Wt 270.8 lb

## 2011-06-09 DIAGNOSIS — K8020 Calculus of gallbladder without cholecystitis without obstruction: Secondary | ICD-10-CM

## 2011-06-09 NOTE — Progress Notes (Signed)
Patient ID: [name], female   DOB: 21-Nov-1960, 51 y.o.   MRN: [id number]  Chief Complaint  Patient presents with  . Abdominal Pain    new pt- eval GB w/ stones    HPI [name] is a 51 y.o. female.   HPI The patient is sent at the request of Dr. Evlyn Kanner for epigastric pain and gallstones. She was diagnosed 18 years ago with gallstones and has not had any problem until 3 weeks ago when she developed sharp epigastric pain that radiated to her left shoulder. She went to the emergency room and was evaluated by ultrasound showed multiple gallstones. Her LFTs were minimally elevated. Her white count was normal she was sent home. She had nausea with her pain but no vomiting. She denies any fever or chills.  Past Medical History  Diagnosis Date  . Diabetes mellitus   . Arthritis   . Gallstones     Past Surgical History  Procedure Date  . Cervical fusion 04/17/08    Family History  Problem Relation Age of Onset  . Stroke Father   . Diabetes Father   . Cancer Maternal Aunt     lung  . Diabetes Maternal Grandfather   . Heart disease Paternal Grandfather     Social History History  Substance Use Topics  . Smoking status: Never Smoker   . Smokeless tobacco: Not on file  . Alcohol Use: No    Allergies  Allergen Reactions  . Penicillins Hives    Current Outpatient Prescriptions  Medication Sig Dispense Refill  . aspirin EC 81 MG tablet Take 81 mg by mouth once.      Marland Kitchen BAYER CONTOUR NEXT TEST test strip       . celecoxib (CELEBREX) 200 MG capsule Take 200 mg by mouth daily as needed. For arthritis pain      . Exenatide (BYDUREON ) Inject 1 mL into the skin every 7 (seven) days. Saturday or sunday      . metFORMIN (GLUCOPHAGE) 1000 MG tablet BID times 48H.        Review of Systems Review of Systems  Constitutional: Negative for fever, chills and unexpected weight change.  HENT: Negative for hearing loss, congestion, sore throat, trouble swallowing and voice  change.   Eyes: Negative for visual disturbance.  Respiratory: Negative for cough and wheezing.   Cardiovascular: Negative for chest pain, palpitations and leg swelling.  Gastrointestinal: Negative for nausea, vomiting, abdominal pain, diarrhea, constipation, blood in stool, abdominal distention and anal bleeding.  Genitourinary: Negative for hematuria, vaginal bleeding and difficulty urinating.  Musculoskeletal: Negative for arthralgias.  Skin: Negative for rash and wound.  Neurological: Negative for seizures, syncope and headaches.  Hematological: Negative for adenopathy. Does not bruise/bleed easily.  Psychiatric/Behavioral: Negative for confusion.    Blood pressure 124/82, pulse 96, temperature 98 F (36.7 C), temperature source Temporal, resp. rate 20, height 5\' 3"  (1.6 m), weight 270 lb 12.8 oz (122.834 kg).  Physical Exam Physical Exam  Constitutional: She is oriented to person, place, and time. She appears well-developed and well-nourished.  HENT:  Head: Normocephalic and atraumatic.  Eyes: EOM are normal. Pupils are equal, round, and reactive to light.  Neck: Normal range of motion.  Cardiovascular: Normal rate and regular rhythm.   Pulmonary/Chest: Effort normal and breath sounds normal.  Abdominal: Soft. Bowel sounds are normal. She exhibits no distension. There is no tenderness.  Musculoskeletal: Normal range of motion.  Neurological: She is alert and oriented to person,  place, and time.  Skin: Skin is warm and dry.  Psychiatric: She has a normal mood and affect. Her behavior is normal. Judgment and thought content normal.    Data Reviewed U/S gallstones.  EDP notes 05/31/2011  Assessment    Symptomatic cholelithiasis DM 2 obesity    Plan    Laparoscopic cholecystectomy with cholangiogram.The procedure has been discussed with the patient. Operative and non operative treatments have been discussed. Risks of surgery include bleeding, infection,  Common bile duct  injury,  Injury to the stomach,liver, colon,small intestine, abdominal wall,  Diaphragm,  Major blood vessels,  And the need for an open procedure.  Other risks include worsening of medical problems, death,  DVT and pulmonary embolism, and cardiovascular events.   Medical options have also been discussed. The patient has been informed of long term expectations of surgery and non surgical options,  The patient agrees to proceed.         Jquan Egelston A. 06/09/2011, 2:08 PM

## 2011-06-09 NOTE — Patient Instructions (Signed)
Laparoscopic Cholecystectomy Laparoscopic cholecystectomy is surgery to remove the gallbladder. The gallbladder is located slightly to the right of center in the abdomen, behind the liver. It is a concentrating and storage sac for the bile produced in the liver. Bile aids in the digestion and absorption of fats. Gallbladder disease (cholecystitis) is an inflammation of your gallbladder. This condition is usually caused by a buildup of gallstones (cholelithiasis) in your gallbladder. Gallstones can block the flow of bile, resulting in inflammation and pain. In severe cases, emergency surgery may be required. When emergency surgery is not required, you will have time to prepare for the procedure. Laparoscopic surgery is an alternative to open surgery. Laparoscopic surgery usually has a shorter recovery time. Your common bile duct may also need to be examined and explored. Your caregiver will discuss this with you if he or she feels this should be done. If stones are found in the common bile duct, they may be removed. LET YOUR CAREGIVER KNOW ABOUT:  Allergies to food or medicine.   Medicines taken, including vitamins, herbs, eyedrops, over-the-counter medicines, and creams.   Use of steroids (by mouth or creams).   Previous problems with anesthetics or numbing medicines.   History of bleeding problems or blood clots.   Previous surgery.   Other health problems, including diabetes and kidney problems.   Possibility of pregnancy, if this applies.  RISKS AND COMPLICATIONS All surgery is associated with risks. Some problems that may occur following this procedure include:  Infection.   Damage to the common bile duct, nerves, arteries, veins, or other internal organs such as the stomach or intestines.   Bleeding.   A stone may remain in the common bile duct.  BEFORE THE PROCEDURE  Do not take aspirin for 3 days prior to surgery or blood thinners for 1 week prior to surgery.   Do not eat or  drink anything after midnight the night before surgery.   Let your caregiver know if you develop a cold or other infectious problem prior to surgery.   You should be present 60 minutes before the procedure or as directed.  PROCEDURE  You will be given medicine that makes you sleep (general anesthetic). When you are asleep, your surgeon will make several small cuts (incisions) in your abdomen. One of these incisions is used to insert a small, lighted scope (laparoscope) into the abdomen. The laparoscope helps the surgeon see into your abdomen. Carbon dioxide gas will be pumped into your abdomen. The gas allows more room for the surgeon to perform your surgery. Other operating instruments are inserted through the other incisions. Laparoscopic procedures may not be appropriate when:  There is major scarring from previous surgery.   The gallbladder is extremely inflamed.   There are bleeding disorders or unexpected cirrhosis of the liver.   A pregnancy is near term.   Other conditions make the laparoscopic procedure impossible.  If your surgeon feels it is not safe to continue with a laparoscopic procedure, he or she will perform an open abdominal procedure. In this case, the surgeon will make an incision to open the abdomen. This gives the surgeon a larger view and field to work within. This may allow the surgeon to perform procedures that sometimes cannot be performed with a laparoscope alone. Open surgery has a longer recovery time. AFTER THE PROCEDURE  You will be taken to the recovery area where a nurse will watch and check your progress.   You may be allowed to go home   the same day.   Do not resume physical activities until directed by your caregiver.   You may resume a normal diet and activities as directed.  Document Released: 03/03/2005 Document Revised: 02/20/2011 Document Reviewed: 08/16/2010 Exit[employer] Patient Information 2012 [employer].  Laparoscopic Cholecystectomy Care  After These instructions give you information on caring for yourself after your procedure. Your doctor may also give you more specific instructions. Call your doctor if you have any problems or questions after your procedure. HOME CARE  Change your bandages (dressings) as told by your doctor.   Keep the wound dry and clean. Wash the wound gently with soap and water. Pat the wound dry with a clean towel.   Do not take baths, swim, or use hot tubs for 10 days, or as told by your doctor.   Only take medicine as told by your doctor.   Eat a normal diet as told by your doctor.   Do not lift anything heavier than 25 pounds (11.5 kg), or as told by your doctor.   Do not play contact sports for 1 week, or as told by your doctor.  GET HELP RIGHT AWAY IF:   Your wound is red, puffy (swollen), or painful.   You have yellowish-white fluid (pus) coming from the wound.   You have fluid draining from the wound for more than 1 day.   You have a bad smell coming from the wound.   Your wound breaks open.   You have a rash.   You have trouble breathing.   You have chest pain.   You have a bad reaction to your medicine.   You have a fever.   You have pain in the shoulders (shoulder strap areas).   You feel dizzy or pass out (faint).   You have severe belly (abdominal) pain.   You feel sick to your stomach (nauseous) or throw up (vomit) for more than 1 day.  MAKE SURE YOU:  Understand these instructions.   Will watch your condition.   Will get help right away if you are not doing well or get worse.  Document Released: 12/11/2007 Document Revised: 02/20/2011 Document Reviewed: 08/20/2010 [employer] Patient Information 2012 [employer]. 

## 2011-06-18 ENCOUNTER — Encounter (HOSPITAL_COMMUNITY): Payer: Self-pay | Admitting: Pharmacy Technician

## 2011-06-26 ENCOUNTER — Encounter (HOSPITAL_COMMUNITY)
Admission: RE | Admit: 2011-06-26 | Discharge: 2011-06-26 | Disposition: A | Discharge status: Home / Self Care | Payer: BC Managed Care – PPO | Source: Ambulatory Visit | Attending: Surgery | Admitting: Surgery

## 2011-06-26 ENCOUNTER — Encounter (HOSPITAL_COMMUNITY): Payer: Self-pay

## 2011-06-26 ENCOUNTER — Ambulatory Visit (INDEPENDENT_AMBULATORY_CARE_PROVIDER_SITE_OTHER): Payer: BC Managed Care – PPO | Admitting: General Surgery

## 2011-06-26 HISTORY — DX: Anesthesia of skin: R20.0

## 2011-06-26 LAB — DIFFERENTIAL
Eosinophils Relative: 1 % (ref 0–5)
Lymphocytes Relative: 26 % (ref 12–46)
Lymphs Abs: 2.5 10*3/uL (ref 0.7–4.0)
Monocytes Absolute: 0.5 10*3/uL (ref 0.1–1.0)
Monocytes Relative: 5 % (ref 3–12)

## 2011-06-26 LAB — CBC
MCH: 27.6 pg (ref 26.0–34.0)
MCHC: 33.2 g/dL (ref 30.0–36.0)
Platelets: 304 10*3/uL (ref 150–400)
RBC: 5.44 MIL/uL — ABNORMAL HIGH (ref 3.87–5.11)

## 2011-06-26 LAB — COMPREHENSIVE METABOLIC PANEL
ALT: 21 U/L (ref 0–35)
AST: 36 U/L (ref 0–37)
Calcium: 10.1 mg/dL (ref 8.4–10.5)
Sodium: 139 mEq/L (ref 135–145)
Total Protein: 7.8 g/dL (ref 6.0–8.3)

## 2011-06-26 NOTE — Patient Instructions (Signed)
20 [name]  06/26/2011   Your procedure is scheduled on:  07-01-11  Report to [facility] at  0530 AM.  Call this number if you have problems the morning of surgery: [phone]   Remember:   Do not eat food or drink liquids:After Midnight.  .  Take these medicines the morning of surgery with A SIP OF WATER: zyrtec if needed   Do not wear jewelry or make up.  Do not wear lotions, powders, or perfumes.Do not wear deodorant.    Do not bring valuables to the hospital.  Contacts, dentures or bridgework may not be worn into surgery.  Leave suitcase in the car. After surgery it may be brought to your room.  For patients admitted to the hospital, checkout time is 11:00 AM the day of discharge.     Special Instructions: CHG Shower Use Special Wash: 1/2 bottle night before surgery and 1/2 bottle morning of surgery.neck down avoid private area, no shaving for 2 days before showers   Please read over the following fact sheets that you were given: MRSA Information  [facility] pre op nurse phone number [phone], call if needed

## 2011-06-27 NOTE — Pre-Procedure Instructions (Addendum)
ekg 05-31-11 epic Chest 2 view xray 05-31-11 epic

## 2011-06-30 NOTE — Anesthesia Preprocedure Evaluation (Addendum)
Anesthesia Evaluation  Patient identified by MRN, date of birth, ID band Patient awake    Reviewed: Allergy & Precautions, H&P , NPO status , Patient's Chart, lab work & pertinent test results  Airway Mallampati: II TM Distance: >3 FB Neck ROM: full    Dental No notable dental hx. (+) Teeth Intact and Dental Advisory Given   Pulmonary neg pulmonary ROS,  breath sounds clear to auscultation  Pulmonary exam normal       Cardiovascular Exercise Tolerance: Good negative cardio ROS  Rhythm:regular Rate:Normal     Neuro/Psych negative neurological ROS  negative psych ROS   GI/Hepatic negative GI ROS, Neg liver ROS,   Endo/Other  Diabetes mellitus-, Well Controlled, Type 2, Oral Hypoglycemic AgentsMorbid obesity  Renal/GU negative Renal ROS  negative genitourinary   Musculoskeletal   Abdominal   Peds  Hematology negative hematology ROS (+)   Anesthesia Other Findings   Reproductive/Obstetrics negative OB ROS                         Anesthesia Physical Anesthesia Plan  ASA: II  Anesthesia Plan: General   Post-op Pain Management:    Induction: Intravenous  Airway Management Planned: Oral ETT  Additional Equipment:   Intra-op Plan:   Post-operative Plan: Extubation in OR  Informed Consent: I have reviewed the patients History and Physical, chart, labs and discussed the procedure including the risks, benefits and alternatives for the proposed anesthesia with the patient or authorized representative who has indicated his/her understanding and acceptance.   Dental Advisory Given  Plan Discussed with: CRNA and Surgeon  Anesthesia Plan Comments:         Anesthesia Quick Evaluation

## 2011-07-01 ENCOUNTER — Ambulatory Visit (HOSPITAL_COMMUNITY): Payer: BC Managed Care – PPO

## 2011-07-01 ENCOUNTER — Ambulatory Visit (HOSPITAL_COMMUNITY)
Admission: RE | Admit: 2011-07-01 | Discharge: 2011-07-01 | Disposition: A | Discharge status: Home / Self Care | Payer: BC Managed Care – PPO | Source: Ambulatory Visit | Attending: Surgery | Admitting: Surgery

## 2011-07-01 ENCOUNTER — Encounter (HOSPITAL_COMMUNITY)
Admission: RE | Disposition: A | Discharge status: Home / Self Care | Payer: Self-pay | Source: Ambulatory Visit | Attending: Surgery

## 2011-07-01 ENCOUNTER — Encounter (HOSPITAL_COMMUNITY): Payer: Self-pay | Admitting: *Deleted

## 2011-07-01 ENCOUNTER — Ambulatory Visit (HOSPITAL_COMMUNITY): Payer: BC Managed Care – PPO | Admitting: Anesthesiology

## 2011-07-01 ENCOUNTER — Encounter (HOSPITAL_COMMUNITY): Payer: Self-pay | Admitting: Anesthesiology

## 2011-07-01 DIAGNOSIS — E669 Obesity, unspecified: Secondary | ICD-10-CM | POA: Insufficient documentation

## 2011-07-01 DIAGNOSIS — Z79899 Other long term (current) drug therapy: Secondary | ICD-10-CM | POA: Insufficient documentation

## 2011-07-01 DIAGNOSIS — E119 Type 2 diabetes mellitus without complications: Secondary | ICD-10-CM | POA: Insufficient documentation

## 2011-07-01 DIAGNOSIS — Z01812 Encounter for preprocedural laboratory examination: Secondary | ICD-10-CM | POA: Insufficient documentation

## 2011-07-01 DIAGNOSIS — K8010 Calculus of gallbladder with chronic cholecystitis without obstruction: Secondary | ICD-10-CM

## 2011-07-01 DIAGNOSIS — Z7982 Long term (current) use of aspirin: Secondary | ICD-10-CM | POA: Insufficient documentation

## 2011-07-01 HISTORY — PX: CHOLECYSTECTOMY: SHX55

## 2011-07-01 LAB — GLUCOSE, CAPILLARY
Glucose-Capillary: 96 mg/dL (ref 70–99)
Glucose-Capillary: 147 mg/dL — ABNORMAL HIGH (ref 70–99)

## 2011-07-01 SURGERY — LAPAROSCOPIC CHOLECYSTECTOMY WITH INTRAOPERATIVE CHOLANGIOGRAM
Anesthesia: General | Site: Abdomen | Wound class: Clean Contaminated

## 2011-07-01 MED ORDER — PROMETHAZINE HCL 25 MG/ML IJ SOLN
INTRAMUSCULAR | Status: AC
  Filled 2011-07-01: qty 1

## 2011-07-01 MED ORDER — FENTANYL CITRATE 0.05 MG/ML IJ SOLN
INTRAMUSCULAR | Status: DC | PRN
  Administered 2011-07-01 (×3): 50 ug via INTRAVENOUS

## 2011-07-01 MED ORDER — GLYCOPYRROLATE 0.2 MG/ML IJ SOLN
INTRAMUSCULAR | Status: DC | PRN
  Administered 2011-07-01: 0.6 mg via INTRAVENOUS

## 2011-07-01 MED ORDER — BUPIVACAINE-EPINEPHRINE 0.25% -1:200000 IJ SOLN
INTRAMUSCULAR | Status: DC | PRN
  Administered 2011-07-01: 16 mL

## 2011-07-01 MED ORDER — DEXAMETHASONE SODIUM PHOSPHATE 10 MG/ML IJ SOLN
INTRAMUSCULAR | Status: DC | PRN
  Administered 2011-07-01: 10 mg via INTRAVENOUS

## 2011-07-01 MED ORDER — SODIUM CHLORIDE 0.9 % IJ SOLN: 3.0000 mL | Freq: Two times a day (BID) | INTRAMUSCULAR | Status: DC

## 2011-07-01 MED ORDER — EPHEDRINE SULFATE 50 MG/ML IJ SOLN
INTRAMUSCULAR | Status: DC | PRN
  Administered 2011-07-01: 10 mg via INTRAVENOUS
  Administered 2011-07-01: 5 mg via INTRAVENOUS

## 2011-07-01 MED ORDER — OXYCODONE HCL 5 MG PO TABS
ORAL_TABLET | ORAL | Status: AC
  Filled 2011-07-01: qty 1

## 2011-07-01 MED ORDER — CIPROFLOXACIN IN D5W 400 MG/200ML IV SOLN
INTRAVENOUS | Status: AC
  Filled 2011-07-01: qty 200

## 2011-07-01 MED ORDER — SUCCINYLCHOLINE CHLORIDE 20 MG/ML IJ SOLN
INTRAMUSCULAR | Status: DC | PRN
  Administered 2011-07-01: 100 mg via INTRAVENOUS

## 2011-07-01 MED ORDER — LACTATED RINGERS IV SOLN
INTRAVENOUS | Status: DC | PRN
  Administered 2011-07-01 (×2): via INTRAVENOUS

## 2011-07-01 MED ORDER — ACETAMINOPHEN 10 MG/ML IV SOLN
INTRAVENOUS | Status: AC
  Filled 2011-07-01: qty 100

## 2011-07-01 MED ORDER — ACETAMINOPHEN 325 MG PO TABS: 650.0000 mg | ORAL_TABLET | ORAL | Status: DC | PRN

## 2011-07-01 MED ORDER — SODIUM CHLORIDE 0.9 % IJ SOLN: 3.0000 mL | INTRAMUSCULAR | Status: DC | PRN

## 2011-07-01 MED ORDER — IOHEXOL 300 MG/ML  SOLN
INTRAMUSCULAR | Status: AC
  Filled 2011-07-01: qty 1

## 2011-07-01 MED ORDER — PROPOFOL 10 MG/ML IV BOLUS
INTRAVENOUS | Status: DC | PRN
  Administered 2011-07-01: 160 mg via INTRAVENOUS

## 2011-07-01 MED ORDER — HYDROMORPHONE HCL PF 1 MG/ML IJ SOLN
INTRAMUSCULAR | Status: AC
  Filled 2011-07-01: qty 1

## 2011-07-01 MED ORDER — ONDANSETRON HCL 4 MG/2ML IJ SOLN: 4.0000 mg | Freq: Four times a day (QID) | INTRAMUSCULAR | Status: DC | PRN

## 2011-07-01 MED ORDER — LIDOCAINE HCL (CARDIAC) 20 MG/ML IV SOLN
INTRAVENOUS | Status: DC | PRN
  Administered 2011-07-01: 50 mg via INTRAVENOUS

## 2011-07-01 MED ORDER — LACTATED RINGERS IV SOLN: INTRAVENOUS | Status: DC

## 2011-07-01 MED ORDER — HYDROMORPHONE HCL PF 1 MG/ML IJ SOLN
0.2500 mg | INTRAMUSCULAR | Status: DC | PRN
  Administered 2011-07-01 (×2): 0.5 mg via INTRAVENOUS

## 2011-07-01 MED ORDER — SODIUM CHLORIDE 0.9 % IV SOLN: 250.0000 mL | INTRAVENOUS | Status: DC | PRN

## 2011-07-01 MED ORDER — ACETAMINOPHEN 10 MG/ML IV SOLN
INTRAVENOUS | Status: DC | PRN
  Administered 2011-07-01: 1000 mg via INTRAVENOUS

## 2011-07-01 MED ORDER — OXYCODONE HCL 5 MG PO TABS
5.0000 mg | ORAL_TABLET | ORAL | Status: DC | PRN
  Administered 2011-07-01: 5 mg via ORAL

## 2011-07-01 MED ORDER — IOHEXOL 300 MG/ML  SOLN
INTRAMUSCULAR | Status: DC | PRN
  Administered 2011-07-01: 12 mL via INTRAVENOUS

## 2011-07-01 MED ORDER — BUPIVACAINE-EPINEPHRINE PF 0.25-1:200000 % IJ SOLN
INTRAMUSCULAR | Status: AC
  Filled 2011-07-01: qty 30

## 2011-07-01 MED ORDER — ONDANSETRON HCL 4 MG/2ML IJ SOLN
4.0000 mg | Freq: Once | INTRAMUSCULAR | Status: AC
  Administered 2011-07-01: 4 mg via INTRAVENOUS

## 2011-07-01 MED ORDER — OXYCODONE-ACETAMINOPHEN 5-325 MG PO TABS: 1.0000 | ORAL_TABLET | ORAL | Status: AC | PRN

## 2011-07-01 MED ORDER — MIDAZOLAM HCL 5 MG/5ML IJ SOLN
INTRAMUSCULAR | Status: DC | PRN
  Administered 2011-07-01: 2 mg via INTRAVENOUS

## 2011-07-01 MED ORDER — LACTATED RINGERS IR SOLN
Status: DC | PRN
  Administered 2011-07-01: 1000 mL

## 2011-07-01 MED ORDER — ONDANSETRON HCL 4 MG/2ML IJ SOLN
INTRAMUSCULAR | Status: AC
  Filled 2011-07-01: qty 2

## 2011-07-01 MED ORDER — MORPHINE SULFATE 10 MG/ML IJ SOLN: 1.0000 mg | INTRAMUSCULAR | Status: DC | PRN

## 2011-07-01 MED ORDER — CIPROFLOXACIN IN D5W 400 MG/200ML IV SOLN
400.0000 mg | INTRAVENOUS | Status: AC
  Administered 2011-07-01: 400 mg via INTRAVENOUS

## 2011-07-01 MED ORDER — ACETAMINOPHEN 650 MG RE SUPP
650.0000 mg | RECTAL | Status: DC | PRN
  Filled 2011-07-01: qty 1

## 2011-07-01 MED ORDER — ROCURONIUM BROMIDE 100 MG/10ML IV SOLN
INTRAVENOUS | Status: DC | PRN
  Administered 2011-07-01: 30 mg via INTRAVENOUS
  Administered 2011-07-01: 10 mg via INTRAVENOUS

## 2011-07-01 MED ORDER — NEOSTIGMINE METHYLSULFATE 1 MG/ML IJ SOLN
INTRAMUSCULAR | Status: DC | PRN
  Administered 2011-07-01: 5 mg via INTRAVENOUS

## 2011-07-01 MED ORDER — PROMETHAZINE HCL 25 MG/ML IJ SOLN
6.2500 mg | INTRAMUSCULAR | Status: DC | PRN
  Administered 2011-07-01: 6.25 mg via INTRAVENOUS

## 2011-07-01 MED ORDER — ONDANSETRON HCL 4 MG/2ML IJ SOLN
INTRAMUSCULAR | Status: DC | PRN
  Administered 2011-07-01: 4 mg via INTRAVENOUS

## 2011-07-01 SURGICAL SUPPLY — 37 items
APPLIER CLIP ROT 10 11.4 M/L (STAPLE) ×2
BAG SPEC RTRVL LRG 6X4 10 (ENDOMECHANICALS) ×1
CANISTER SUCTION 2500CC (MISCELLANEOUS) ×2 IMPLANT
CHLORAPREP W/TINT 26ML (MISCELLANEOUS) ×2 IMPLANT
CLIP APPLIE ROT 10 11.4 M/L (STAPLE) ×1 IMPLANT
CLOTH BEACON ORANGE TIMEOUT ST (SAFETY) ×2 IMPLANT
COVER MAYO STAND STRL (DRAPES) ×2 IMPLANT
DECANTER SPIKE VIAL GLASS SM (MISCELLANEOUS) ×2 IMPLANT
DERMABOND ADVANCED (GAUZE/BANDAGES/DRESSINGS) ×2
DERMABOND ADVANCED .7 DNX12 (GAUZE/BANDAGES/DRESSINGS) ×2 IMPLANT
DRAPE C-ARM 42X72 X-RAY (DRAPES) ×2 IMPLANT
DRAPE LAPAROSCOPIC ABDOMINAL (DRAPES) ×2 IMPLANT
DRAPE WARM FLUID 44X44 (DRAPE) ×2 IMPLANT
ELECT REM PT RETURN 9FT ADLT (ELECTROSURGICAL) ×2
ELECTRODE REM PT RTRN 9FT ADLT (ELECTROSURGICAL) ×1 IMPLANT
FILTER SMOKE EVAC LAPAROSHD (FILTER) ×2 IMPLANT
GLOVE BIOGEL PI IND STRL 7.0 (GLOVE) ×2 IMPLANT
GLOVE BIOGEL PI INDICATOR 7.0 (GLOVE) ×2
GLOVE INDICATOR 8.0 STRL GRN (GLOVE) ×4 IMPLANT
GLOVE SS BIOGEL STRL SZ 8 (GLOVE) ×2 IMPLANT
GLOVE SUPERSENSE BIOGEL SZ 8 (GLOVE) ×2
GOWN STRL NON-REIN LRG LVL3 (GOWN DISPOSABLE) ×4 IMPLANT
GOWN STRL REIN XL XLG (GOWN DISPOSABLE) ×4 IMPLANT
HEMOSTAT SURGICEL 4X8 (HEMOSTASIS) IMPLANT
HOVERMATT SINGLE USE (MISCELLANEOUS) ×2 IMPLANT
KIT BASIN OR (CUSTOM PROCEDURE TRAY) ×2 IMPLANT
POUCH SPECIMEN RETRIEVAL 10MM (ENDOMECHANICALS) ×2 IMPLANT
SCISSORS LAP 5X35 DISP (ENDOMECHANICALS) ×2 IMPLANT
SET CHOLANGIOGRAPH MIX (MISCELLANEOUS) ×2 IMPLANT
SET IRRIG TUBING LAPAROSCOPIC (IRRIGATION / IRRIGATOR) ×2 IMPLANT
SUT MNCRL AB 4-0 PS2 18 (SUTURE) ×2 IMPLANT
TOWEL OR 17X26 10 PK STRL BLUE (TOWEL DISPOSABLE) ×2 IMPLANT
TRAY LAP CHOLE (CUSTOM PROCEDURE TRAY) ×2 IMPLANT
TROCAR BLADELESS OPT 5 75 (ENDOMECHANICALS) ×4 IMPLANT
TROCAR XCEL BLUNT TIP 100MML (ENDOMECHANICALS) ×2 IMPLANT
TROCAR XCEL NON-BLD 11X100MML (ENDOMECHANICALS) ×2 IMPLANT
TUBING INSUFFLATION 10FT LAP (TUBING) ×2 IMPLANT

## 2011-07-01 NOTE — Anesthesia Procedure Notes (Signed)
Procedure Name: Intubation Date/Time: 07/01/2011 7:30 AM Performed by: Doran Clay Pre-anesthesia Checklist: Patient identified, Timeout performed, Emergency Drugs available, Suction available and Patient being monitored Patient Re-evaluated:Patient Re-evaluated prior to inductionOxygen Delivery Method: Circle system utilized Preoxygenation: Pre-oxygenation with 100% oxygen Intubation Type: IV induction Laryngoscope Size: Mac and 4 Grade View: Grade I Tube type: Oral Tube size: 7.0 mm Number of attempts: 1 Airway Equipment and Method: Stylet Placement Confirmation: ETT inserted through vocal cords under direct vision,  breath sounds checked- equal and bilateral and positive ETCO2 Secured at: 20 cm Tube secured with: Tape Dental Injury: Teeth and Oropharynx as per pre-operative assessment

## 2011-07-01 NOTE — Progress Notes (Signed)
Pt was nauseated on arrival to short stay Zofran and Phenergan given as ordered by Dr. Leta Jungling

## 2011-07-01 NOTE — Interval H&P Note (Signed)
History and Physical Interval Note:  07/01/2011 6:50 AM  [name]  has presented today for surgery, with the diagnosis of gallstones  The various methods of treatment have been discussed with the patient and family. After consideration of risks, benefits and other options for treatment, the patient has consented to  Procedure(s) (LRB): LAPAROSCOPIC CHOLECYSTECTOMY WITH INTRAOPERATIVE CHOLANGIOGRAM (N/A) as a surgical intervention .  The patients' history has been reviewed, patient examined, no change in status, stable for surgery.  I have reviewed the patients' chart and labs.  Questions were answered to the patient's satisfaction.     Rosaura Bolon A.

## 2011-07-01 NOTE — Anesthesia Postprocedure Evaluation (Signed)
  Anesthesia Post-op Note  Patient: [name]  Procedure(s) Performed: Procedure(s) (LRB): LAPAROSCOPIC CHOLECYSTECTOMY WITH INTRAOPERATIVE CHOLANGIOGRAM (N/A)  Patient Location: PACU  Anesthesia Type: General  Level of Consciousness: awake and alert   Airway and Oxygen Therapy: Patient Spontanous Breathing  Post-op Pain: mild  Post-op Assessment: Post-op Vital signs reviewed, Patient's Cardiovascular Status Stable, Respiratory Function Stable, Patent Airway and No signs of Nausea or vomiting  Post-op Vital Signs: stable  Complications: No apparent anesthesia complications

## 2011-07-01 NOTE — Discharge Instructions (Signed)
[facility] ______[facility] °LAPAROSCOPIC SURGERY: POST OP INSTRUCTIONS °Always review your discharge instruction sheet given to you by the facility where your surgery was performed. °IF YOU HAVE DISABILITY OR FAMILY LEAVE FORMS, YOU MUST BRING THEM TO THE OFFICE FOR PROCESSING.   °DO NOT GIVE THEM TO YOUR DOCTOR. ° °1. A prescription for pain medication may be given to you upon discharge.  Take your pain medication as prescribed, if needed.  If narcotic pain medicine is not needed, then you may take acetaminophen (Tylenol) or ibuprofen (Advil) as needed. °2. Take your usually prescribed medications unless otherwise directed. °3. If you need a refill on your pain medication, please contact your pharmacy.  They will contact our office to request authorization. Prescriptions will not be filled after 5pm or on week-ends. °4. You should follow a light diet the first few days after arrival home, such as soup and crackers, etc.  Be sure to include lots of fluids daily. °5. Most patients will experience some swelling and bruising in the area of the incisions.  Ice packs will help.  Swelling and bruising can take several days to resolve.  °6. It is common to experience some constipation if taking pain medication after surgery.  Increasing fluid intake and taking a stool softener (such as Colace) will usually help or prevent this problem from occurring.  A mild laxative (Milk of Magnesia or Miralax) should be taken according to package instructions if there are no bowel movements after 48 hours. °7. Unless discharge instructions indicate otherwise, you may remove your bandages 24-48 hours after surgery, and you may shower at that time.  You may have steri-strips (small skin tapes) in place directly over the incision.  These strips should be left on the skin for 7-10 days.  If your surgeon used skin glue on the incision, you may shower in 24 hours.  The glue will flake off over the next 2-3 weeks.  Any sutures or  staples will be removed at the office during your follow-up visit. °8. ACTIVITIES:  You may resume regular (light) daily activities beginning the next day--such as daily self-care, walking, climbing stairs--gradually increasing activities as tolerated.  You may have sexual intercourse when it is comfortable.  Refrain from any heavy lifting or straining until approved by your doctor. °a. You may drive when you are no longer taking prescription pain medication, you can comfortably wear a seatbelt, and you can safely maneuver your car and apply brakes. °b. RETURN TO WORK:  __________________________________________________________ °9. You should see your doctor in the office for a follow-up appointment approximately 2-3 weeks after your surgery.  Make sure that you call for this appointment within a day or two after you arrive home to insure a convenient appointment time. °10. OTHER INSTRUCTIONS: __________________________________________________________________________________________________________________________ __________________________________________________________________________________________________________________________ °WHEN TO CALL YOUR DOCTOR: °1. Fever over 101.0 °2. Inability to urinate °3. Continued bleeding from incision. °4. Increased pain, redness, or drainage from the incision. °5. Increasing abdominal pain ° °The clinic staff is available to answer your questions during regular business hours.  Please don’t hesitate to call and ask to speak to one of the nurses for clinical concerns.  If you have a medical emergency, go to the nearest emergency room or call 911.  A surgeon from [facility] is always on call at the hospital. °[address], [address], [place], [place]  [address] ? [address] [address], [place], [place]   [address] °[phone] ? [phone] ? FAX [phone] °Web site:   [email or web address] °

## 2011-07-01 NOTE — Progress Notes (Signed)
Pt states nausea better

## 2011-07-01 NOTE — Transfer of Care (Signed)
Immediate Anesthesia Transfer of Care Note  Patient: [name]  Procedure(s) Performed: Procedure(s) (LRB): LAPAROSCOPIC CHOLECYSTECTOMY WITH INTRAOPERATIVE CHOLANGIOGRAM (N/A)  Patient Location: PACU  Anesthesia Type: General  Level of Consciousness: sedated  Airway & Oxygen Therapy: Patient Spontanous Breathing and Patient connected to face mask oxygen  Post-op Assessment: Report given to PACU RN and Post -op Vital signs reviewed and stable  Post vital signs: Reviewed and stable  Complications: No apparent anesthesia complications

## 2011-07-01 NOTE — Progress Notes (Signed)
Up and walked in hall and to BR. Unable to void. Sitting up in chair in room. Pain med given

## 2011-07-01 NOTE — Op Note (Signed)
Laparoscopic Cholecystectomy with IOC Procedure Note  Indications: This patient presents with symptomatic gallbladder disease and will undergo laparoscopic cholecystectomy.  Pre-operative Diagnosis: Calculus of gallbladder with other cholecystitis, without mention of obstruction  Post-operative Diagnosis: Same  Surgeon: Mattison Stuckey A.   Assistants: OR staff  Anesthesia: General endotracheal anesthesia and Local anesthesia 0.25.% bupivacaine, with epinephrine  ASA Class: 3  Procedure Details  The patient was seen again in the Holding Room. The risks, benefits, complications, treatment options, and expected outcomes were discussed with the patient. The possibilities of reaction to medication, pulmonary aspiration, perforation of viscus, bleeding, recurrent infection, finding a normal gallbladder, the need for additional procedures, failure to diagnose a condition, the possible need to convert to an open procedure, and creating a complication requiring transfusion or operation were discussed with the patient. The patient and/or family concurred with the proposed plan, giving informed consent. The site of surgery properly noted/marked. The patient was taken to Operating Room, identified as [name] and the procedure verified as Laparoscopic Cholecystectomy with Intraoperative Cholangiograms. A Time Out was held and the above information confirmed.  Prior to the induction of general anesthesia, antibiotic prophylaxis was administered. General endotracheal anesthesia was then administered and tolerated well. After the induction, the abdomen was prepped in the usual sterile fashion. The patient was positioned in the supine position with the left arm comfortably tucked, along with some reverse Trendelenburg.  Local anesthetic agent was injected into the skin near the umbilicus and an incision made. The midline fascia was incised and the Hasson technique was used to introduce a 10 mm port  under direct vision. It was secured with a figure of eight Vicryl suture placed in the usual fashion. Pneumoperitoneum was then created with CO2 and tolerated well without any adverse changes in the patient's vital signs. Additional trocars were introduced under direct vision. All skin incisions were infiltrated with a local anesthetic agent before making the incision and placing the trocars.   The gallbladder was identified, the fundus grasped and retracted cephalad. Adhesions were lysed bluntly and with the electrocautery where indicated, taking care not to injure any adjacent organs or viscus. The infundibulum was grasped and retracted laterally, exposing the peritoneum overlying the triangle of Calot. This was then divided and exposed in a blunt fashion. The cystic duct was clearly identified and bluntly dissected circumferentially. The junctions of the gallbladder, cystic duct and common bile duct were clearly identified prior to the division of any linear structure. An incision was made in the cystic duct and the cholangiogram catheter introduced. The catheter was secured using an endoclip. The study showed no stones and good visualization of the distal and proximal biliary tree. The catheter was then removed.   The cystic duct was then  ligated with surgical clips  on the patient side and clipped on the gallbladder side and divided. The cystic artery was identified, dissected free, ligated with clips and divided as well.  Posterior branch of cystic artery was clipped and divided.  The gallbladder was dissected from the liver bed in retrograde fashion with the electrocautery. The gallbladder was removed. The liver bed was irrigated and inspected. Hemostasis was achieved with the electrocautery. Copious irrigation was utilized and was repeatedly aspirated until clear all particulate matter.  Pneumoperitoneum was completely reduced after viewing removal of the trocars under direct vision. The wound was  thoroughly irrigated and the fascia was then closed with a figure of eight suture; the skin was then closed with 4 0 monocryl  and a sterile dressing was applied of Dermabond.  Instrument, sponge, and needle counts were correct at closure and at the conclusion of the case.   Findings: Cholecystitis with Cholelithiasis  Estimated Blood Loss: less than 50 mL         Drains: none         Total IV Fluids:         Specimens: Gallbladder           Complications: None; patient tolerated the procedure well.         Disposition: PACU - hemodynamically stable.         Condition: stable

## 2011-07-01 NOTE — H&P (View-Only) (Signed)
Patient ID: [name], female   DOB: [birth year], 51 y.o.   MRN: [id number]  Chief Complaint  Patient presents with  . Abdominal Pain    new pt- eval GB w/ stones    HPI [name] is a 51 y.o. female.   HPI The patient is sent at the request of Dr. Evlyn Kanner for epigastric pain and gallstones. She was diagnosed 18 years ago with gallstones and has not had any problem until 3 weeks ago when she developed sharp epigastric pain that radiated to her left shoulder. She went to the emergency room and was evaluated by ultrasound showed multiple gallstones. Her LFTs were minimally elevated. Her white count was normal she was sent home. She had nausea with her pain but no vomiting. She denies any fever or chills.  Past Medical History  Diagnosis Date  . Diabetes mellitus   . Arthritis   . Gallstones     Past Surgical History  Procedure Date  . Cervical fusion 04/17/08    Family History  Problem Relation Age of Onset  . Stroke Father   . Diabetes Father   . Cancer Maternal Aunt     lung  . Diabetes Maternal Grandfather   . Heart disease Paternal Grandfather     Social History History  Substance Use Topics  . Smoking status: Never Smoker   . Smokeless tobacco: Not on file  . Alcohol Use: No    Allergies  Allergen Reactions  . Penicillins Hives    Current Outpatient Prescriptions  Medication Sig Dispense Refill  . aspirin EC 81 MG tablet Take 81 mg by mouth once.      Marland Kitchen BAYER CONTOUR NEXT TEST test strip       . celecoxib (CELEBREX) 200 MG capsule Take 200 mg by mouth daily as needed. For arthritis pain      . Exenatide (BYDUREON ) Inject 1 mL into the skin every 7 (seven) days. Saturday or sunday      . metFORMIN (GLUCOPHAGE) 1000 MG tablet BID times 48H.        Review of Systems Review of Systems  Constitutional: Negative for fever, chills and unexpected weight change.  HENT: Negative for hearing loss, congestion, sore throat, trouble swallowing and voice  change.   Eyes: Negative for visual disturbance.  Respiratory: Negative for cough and wheezing.   Cardiovascular: Negative for chest pain, palpitations and leg swelling.  Gastrointestinal: Negative for nausea, vomiting, abdominal pain, diarrhea, constipation, blood in stool, abdominal distention and anal bleeding.  Genitourinary: Negative for hematuria, vaginal bleeding and difficulty urinating.  Musculoskeletal: Negative for arthralgias.  Skin: Negative for rash and wound.  Neurological: Negative for seizures, syncope and headaches.  Hematological: Negative for adenopathy. Does not bruise/bleed easily.  Psychiatric/Behavioral: Negative for confusion.    Blood pressure 124/82, pulse 96, temperature 98 F (36.7 C), temperature source Temporal, resp. rate 20, height 5\' 3"  (1.6 m), weight 270 lb 12.8 oz (122.834 kg).  Physical Exam Physical Exam  Constitutional: She is oriented to person, place, and time. She appears well-developed and well-nourished.  HENT:  Head: Normocephalic and atraumatic.  Eyes: EOM are normal. Pupils are equal, round, and reactive to light.  Neck: Normal range of motion.  Cardiovascular: Normal rate and regular rhythm.   Pulmonary/Chest: Effort normal and breath sounds normal.  Abdominal: Soft. Bowel sounds are normal. She exhibits no distension. There is no tenderness.  Musculoskeletal: Normal range of motion.  Neurological: She is alert and oriented to person,  place, and time.  Skin: Skin is warm and dry.  Psychiatric: She has a normal mood and affect. Her behavior is normal. Judgment and thought content normal.    Data Reviewed U/S gallstones.  EDP notes 05/31/2011  Assessment    Symptomatic cholelithiasis DM 2 obesity    Plan    Laparoscopic cholecystectomy with cholangiogram.The procedure has been discussed with the patient. Operative and non operative treatments have been discussed. Risks of surgery include bleeding, infection,  Common bile duct  injury,  Injury to the stomach,liver, colon,small intestine, abdominal wall,  Diaphragm,  Major blood vessels,  And the need for an open procedure.  Other risks include worsening of medical problems, death,  DVT and pulmonary embolism, and cardiovascular events.   Medical options have also been discussed. The patient has been informed of long term expectations of surgery and non surgical options,  The patient agrees to proceed.         Corey Caulfield A. 06/09/2011, 2:08 PM

## 2011-07-06 ENCOUNTER — Telehealth (INDEPENDENT_AMBULATORY_CARE_PROVIDER_SITE_OTHER): Payer: Self-pay | Admitting: General Surgery

## 2011-07-06 NOTE — Telephone Encounter (Signed)
Pt called with complaints of an episode of nausea and vomiting last night after eating a sandwich.  She had been feeling okay Wednesday after her lap chole but thurs, fri, and sat she had some increased pain and some nausea. She describes the pain as burning around her umbilicus.  No fevers or chills.  I recommended that she come into the ER for evaluation with labs and possible CT abdomen.

## 2011-07-07 ENCOUNTER — Ambulatory Visit (INDEPENDENT_AMBULATORY_CARE_PROVIDER_SITE_OTHER): Payer: BC Managed Care – PPO | Admitting: General Surgery

## 2011-07-07 ENCOUNTER — Encounter (INDEPENDENT_AMBULATORY_CARE_PROVIDER_SITE_OTHER): Payer: Self-pay | Admitting: General Surgery

## 2011-07-07 VITALS — BP 130/83 | HR 90 | Temp 97.4°F | Ht 63.0 in | Wt 261.4 lb

## 2011-07-07 DIAGNOSIS — R112 Nausea with vomiting, unspecified: Secondary | ICD-10-CM

## 2011-07-07 DIAGNOSIS — Z9889 Other specified postprocedural states: Secondary | ICD-10-CM | POA: Insufficient documentation

## 2011-07-07 NOTE — Progress Notes (Signed)
HPI The patient is complaining of nausea and vomiting postprandially. It is abnormal multiple occasions since her surgery which about 6 days ago.  PE On examination her wounds look well with no evidence of infection. She has hypoactive bowel sounds minimal tenderness.  Studiy review Currently there are no studies to review however I will get laboratory studies today  Assessment Postoperative pain nausea vomiting status post laparoscopic cholecystectomy 6 days ago  I do not feel as though this is likely bile leak however she may have a retained stone and one that is irritating her common bile duct.  Plan Since the patient for a CBC with differential and also a cemented to look at her liver function tests. She is to keep her appointment to see Dr. Mignon Pine tomorrow for which time he will be able to talk with her about the results of her laboratory studies.

## 2011-07-08 ENCOUNTER — Ambulatory Visit (INDEPENDENT_AMBULATORY_CARE_PROVIDER_SITE_OTHER): Payer: BC Managed Care – PPO | Admitting: Surgery

## 2011-07-08 ENCOUNTER — Encounter (INDEPENDENT_AMBULATORY_CARE_PROVIDER_SITE_OTHER): Payer: Self-pay | Admitting: Surgery

## 2011-07-08 VITALS — BP 152/100 | HR 76 | Temp 97.8°F | Resp 18 | Ht 63.0 in | Wt 258.6 lb

## 2011-07-08 DIAGNOSIS — R112 Nausea with vomiting, unspecified: Secondary | ICD-10-CM

## 2011-07-08 LAB — CBC
Hemoglobin: 14.1 g/dL (ref 12.0–15.0)
MCH: 27.4 pg (ref 26.0–34.0)
MCHC: 31.4 g/dL (ref 30.0–36.0)
RDW: 13.3 % (ref 11.5–15.5)

## 2011-07-08 LAB — COMPREHENSIVE METABOLIC PANEL
AST: 22 U/L (ref 0–37)
Alkaline Phosphatase: 63 U/L (ref 39–117)
BUN: 7 mg/dL (ref 6–23)
Glucose, Bld: 96 mg/dL (ref 70–99)
Sodium: 142 mEq/L (ref 135–145)
Total Bilirubin: 1.3 mg/dL — ABNORMAL HIGH (ref 0.3–1.2)

## 2011-07-08 MED ORDER — METOCLOPRAMIDE HCL 5 MG/5ML PO SOLN: 10.0000 mg | Freq: Three times a day (TID) | ORAL | Status: DC

## 2011-07-08 MED ORDER — CIPROFLOXACIN HCL 500 MG PO TABS: 500.0000 mg | ORAL_TABLET | Freq: Two times a day (BID) | ORAL | Status: AC

## 2011-07-08 NOTE — Progress Notes (Signed)
The patient returns one week out after laparoscopic cholecystectomy cholangiogram due to persistent nausea and vomiting. She felt well over the weekend but began to develop nausea after meals and vomiting especially solid food Sunday. She received yesterday Dr. Lindie Spruce  labs on her which a reviewed. Her white count is 11,500. Her bilirubin is 1.3. She is taking Phenergan but still has some nausea and vomiting. Liquid seem to be staying down. She has no significant abdominal pain except when she vomits.  On exam abdomen soft nontender without rebound or guarding. She has no peritonitis. Mild erythema noted around the umbilical incision. She is not jaundiced.  Impression: Postoperative nausea and vomiting  Plan: We'll try Reglan since she has a history of diabetes. Vas are stale liquids for now. Vascular stent work its at rest. Will start Cipro 500 mg p.o. b.i.d. given the mild erythema around her umbilical site. If she is not improved next 48 hours, a vascular cause back so she can be rechecked in may require imaging at that point. If the Reglan helps, she will see me next week in followup.

## 2011-07-08 NOTE — Patient Instructions (Signed)
Liquid diet.  Will start reglan for nausea as directed.  Will start cipro for slight redness around the umbilical incision.  Stick with liquid diet and stay hydrated.  Call if symptoms worsen or do not improve over the next 48 hours.  Return next week otherwise.

## 2011-07-09 ENCOUNTER — Telehealth (INDEPENDENT_AMBULATORY_CARE_PROVIDER_SITE_OTHER): Payer: Self-pay | Admitting: General Surgery

## 2011-07-09 NOTE — Telephone Encounter (Signed)
Called in pt refill into [facility] in [place].

## 2011-07-11 ENCOUNTER — Encounter (HOSPITAL_COMMUNITY): Payer: Self-pay | Admitting: Surgery

## 2011-07-14 ENCOUNTER — Telehealth (INDEPENDENT_AMBULATORY_CARE_PROVIDER_SITE_OTHER): Payer: Self-pay

## 2011-07-14 DIAGNOSIS — R112 Nausea with vomiting, unspecified: Secondary | ICD-10-CM

## 2011-07-14 MED ORDER — METOCLOPRAMIDE HCL 5 MG/5ML PO SOLN: 10.0000 mg | Freq: Three times a day (TID) | ORAL | Status: DC

## 2011-07-14 NOTE — Telephone Encounter (Signed)
The patient called after running out of Reglan and now she has constant nausea.  She is just eating soup and jello.  She has an appt for Friday.  I paged Dr Luisa Hart and he said I can refill the Reglan liquid plus 2 more refills.  He will see her Friday.  I notified the pt and sent the RX to the pharmacy.

## 2011-07-18 ENCOUNTER — Ambulatory Visit (INDEPENDENT_AMBULATORY_CARE_PROVIDER_SITE_OTHER): Payer: BC Managed Care – PPO | Admitting: Surgery

## 2011-07-18 ENCOUNTER — Encounter (INDEPENDENT_AMBULATORY_CARE_PROVIDER_SITE_OTHER): Payer: Self-pay | Admitting: Surgery

## 2011-07-18 ENCOUNTER — Encounter: Payer: Self-pay | Admitting: Gastroenterology

## 2011-07-18 VITALS — BP 132/98 | HR 82 | Temp 97.2°F | Resp 14 | Ht 63.0 in | Wt 255.4 lb

## 2011-07-18 DIAGNOSIS — Z9889 Other specified postprocedural states: Secondary | ICD-10-CM

## 2011-07-18 MED ORDER — ERYTHROMYCIN BASE 500 MG PO TABS: 250.0000 mg | ORAL_TABLET | Freq: Three times a day (TID) | ORAL | Status: AC

## 2011-07-18 NOTE — Patient Instructions (Signed)
Hold reglan.  Try e mycin for nausea.  Referring to GI doc for evaluation.

## 2011-07-18 NOTE — Progress Notes (Signed)
Patient returns in followup. She has persistent nausea and anorexia but is somewhat better on Reglan. She is eating  pasta and tolerate liquids.  Exam: Wound well healed abdomen benign  Impression: Status post laparoscopic cholecystectomy with persistent postoperative nausea  Plan: I suspect she may have gastroparesis. She did improve on Reglan but had some side effects but she did not like which included some shaking I will switch her to erythromycin 250 mg with meals and referred to gastroenterology for evaluation.

## 2011-08-13 ENCOUNTER — Encounter: Payer: Self-pay | Admitting: Gastroenterology

## 2011-08-13 ENCOUNTER — Ambulatory Visit (INDEPENDENT_AMBULATORY_CARE_PROVIDER_SITE_OTHER): Payer: BC Managed Care – PPO | Admitting: Gastroenterology

## 2011-08-13 VITALS — BP 110/70 | HR 76 | Ht 63.0 in | Wt 268.1 lb

## 2011-08-13 DIAGNOSIS — Z1211 Encounter for screening for malignant neoplasm of colon: Secondary | ICD-10-CM

## 2011-08-13 DIAGNOSIS — R110 Nausea: Secondary | ICD-10-CM

## 2011-08-13 MED ORDER — MOVIPREP 100 G PO SOLR: 1.0000 | ORAL | Status: DC

## 2011-08-13 NOTE — Progress Notes (Signed)
HPI: This is a   very pleasant 51 year old woman whom I am meeting for the first time today.   She is morbidly obese  She underwent cholecystectomy laparoscopically last month with Dr. Luisa Hart, this was for cholelithiasis and cholecystitis.  IOC was normal.  Was having constant nausea, also had discrete biliary pain episodes.   Was going on for about a month.  Also nausea, dull constant ache.    Nausea a pretty new issue for her, with GB problems.  GB was removed and she felt better for 2-4 days but nausea returned, vomiting.  Returned to see surgeon, was given phenergan and also reglan for possible gastroparesis.  She stopped the reglan for shakes. And instead was put on erythromycin by surgeon, takes daily.  She takes celebrex, but not very often.    She has had DM for three years.  Recently she has had pyrosis, does not taken anything for it.  Zantac seemed to help a bit.  Cbc last month showed white blood cell count 11,000. Her complete metabolic profile is normal except for slightly elevated total bilirubin at 1.3.  Overall her weight is down lately 20 pounds with her acute illness.  Had egd 25 years ago.  Doesn't recall findings.  Never had colonoscopy.    Review of systems: Pertinent positive and negative review of systems were noted in the above HPI section. Complete review of systems was performed and was otherwise normal.    Past Medical History  Diagnosis Date  . Diabetes mellitus 04/17/2008  . Arthritis 03/05/2010  . Gallstones 05/31/2011  . Numbness in both hands parts of both hands    all the time since cervical fusion  . Numbness in feet     numbness parts of both feet since cervical fusion    Past Surgical History  Procedure Date  . Cervical fusion 04/17/08  .  endometria lablation 02-2005  . Cholecystectomy 07/01/2011    Procedure: LAPAROSCOPIC CHOLECYSTECTOMY WITH INTRAOPERATIVE CHOLANGIOGRAM;  Surgeon: Clovis Pu. Cornett, MD;  Location: [facility];   Service: General;  Laterality: N/A;    Current Outpatient Prescriptions  Medication Sig Dispense Refill  . BAYER CONTOUR NEXT TEST test strip       . celecoxib (CELEBREX) 200 MG capsule Take 200 mg by mouth daily as needed. For arthritis pain      . erythromycin (PCE) 500 MG EC tablet Take 250 mg by mouth 3 (three) times daily.      . metFORMIN (GLUCOPHAGE) 1000 MG tablet Take 1,000 mg by mouth 2 (two) times daily with a meal.       . metroNIDAZOLE (METROGEL) 0.75 % gel Apply 1 application topically daily.      Marland Kitchen acetaminophen (TYLENOL) 500 MG tablet Take 1,000 mg by mouth every 6 (six) hours as needed. Pain       . aspirin EC 81 MG tablet Take 81 mg by mouth at bedtime.       . promethazine (PHENERGAN) 25 MG tablet Take 25 mg by mouth every 6 (six) hours as needed.        Allergies as of 08/13/2011 - Review Complete 08/13/2011  Allergen Reaction Noted  . Penicillins Hives 05/31/2011    Family History  Problem Relation Age of Onset  . Stroke Father   . Diabetes Father   . Lung cancer Maternal Aunt   . Diabetes Maternal Grandfather   . Heart disease Maternal Grandfather   . Liver cancer Maternal Aunt   .  Heart disease Father     History   Social History  . Marital Status: Single    Spouse Name: N/A    Number of Children: 0  . Years of Education: N/A   Occupational History  . purchasing agent [employer]   Social History Main Topics  . Smoking status: Never Smoker   . Smokeless tobacco: Never Used  . Alcohol Use: No  . Drug Use: No  . Sexually Active:    Other Topics Concern  . Not on file   Social History Narrative  . No narrative on file       Physical Exam: BP 110/70  Pulse 76  Ht 5\' 3"  (1.6 m)  Wt 268 lb 2 oz (121.621 kg)  BMI 47.50 kg/m2 Constitutional: generally well-appearing except for morbid obesity Psychiatric: alert and oriented x3 Eyes: extraocular movements intact Mouth: oral pharynx moist, no lesions Neck: supple no  lymphadenopathy Cardiovascular: heart regular rate and rhythm Lungs: clear to auscultation bilaterally Abdomen: soft, nontender, nondistended, no obvious ascites, no peritoneal signs, normal bowel sounds Extremities: no lower extremity edema bilaterally Skin: no lesions on visible extremities    Assessment and plan: 51 y.o. female with  nausea, recent cholecystectomy, obesity.  She may indeed have gastroparesis however I would like to rule out other causes before officially labeling that. Shawnie Pons she has gastritis, perhaps peptic ulcer disease, GERD can create nausea as well. That we should proceed with EGD at her soonest convenience to check for these. Also recommend she stop erythromycin for now and try smaller more frequent meals in case she truly does have gastroparesis. She is interested in having screening colonoscopy at the same time as her upper endoscopy since she is turning 51 in 3 weeks anyway.

## 2011-08-13 NOTE — Patient Instructions (Signed)
Stop the erythromycin for now. You will be set up for an upper endoscopy for nausea (LEC). You will be set up for a colonoscopy for routine screening (will be 51 in 3 weeks). Try eating smaller, more frequent meals (4-5 small meals a day) in case you do have a slow stomach.

## 2011-08-18 ENCOUNTER — Telehealth: Payer: Self-pay | Admitting: Gastroenterology

## 2011-08-18 NOTE — Telephone Encounter (Signed)
Pt rescheduled her endo colon until after her birthday so insurance will cover to 09/12/11

## 2011-08-20 ENCOUNTER — Encounter: Payer: BC Managed Care – PPO | Admitting: Gastroenterology

## 2011-09-12 ENCOUNTER — Encounter: Payer: Self-pay | Admitting: Gastroenterology

## 2011-09-12 ENCOUNTER — Ambulatory Visit (AMBULATORY_SURGERY_CENTER): Payer: BC Managed Care – PPO | Admitting: Gastroenterology

## 2011-09-12 VITALS — BP 132/65 | HR 68 | Temp 98.7°F | Resp 18 | Ht 63.0 in | Wt 268.0 lb

## 2011-09-12 DIAGNOSIS — Z1211 Encounter for screening for malignant neoplasm of colon: Secondary | ICD-10-CM

## 2011-09-12 DIAGNOSIS — D126 Benign neoplasm of colon, unspecified: Secondary | ICD-10-CM

## 2011-09-12 DIAGNOSIS — K2970 Gastritis, unspecified, without bleeding: Secondary | ICD-10-CM

## 2011-09-12 DIAGNOSIS — K635 Polyp of colon: Secondary | ICD-10-CM

## 2011-09-12 DIAGNOSIS — D131 Benign neoplasm of stomach: Secondary | ICD-10-CM

## 2011-09-12 DIAGNOSIS — R110 Nausea: Secondary | ICD-10-CM

## 2011-09-12 HISTORY — PX: COLONOSCOPY: SHX174

## 2011-09-12 HISTORY — PX: UPPER GI ENDOSCOPY: SHX6162

## 2011-09-12 LAB — GLUCOSE, CAPILLARY: Glucose-Capillary: 150 mg/dL — ABNORMAL HIGH (ref 70–99)

## 2011-09-12 MED ORDER — SODIUM CHLORIDE 0.9 % IV SOLN: 500.0000 mL | INTRAVENOUS | Status: DC

## 2011-09-12 NOTE — Progress Notes (Signed)
Patient did not experience any of the following events: a burn prior to discharge; a fall within the facility; wrong site/side/patient/procedure/implant event; or a hospital transfer or hospital admission upon discharge from the facility. (G8907) Patient did not have preoperative order for IV antibiotic SSI prophylaxis. (G8918)  

## 2011-09-12 NOTE — Op Note (Signed)
[facility] [address] [place], [place]  [address]  COLONOSCOPY PROCEDURE REPORT  PATIENT:  [name], [name]  MR#:  [id number] BIRTHDATE:  June 20, 1960, 51 yrs. old  GENDER:  female ENDOSCOPIST:  Rachael Fee, MD REF. BY:  Harriette Bouillon, M.D. PROCEDURE DATE:  09/12/2011 PROCEDURE:  Colonoscopy with snare polypectomy ASA CLASS:  Class II INDICATIONS:  Routine Risk Screening MEDICATIONS:   Fentanyl 75 mcg IV, These medications were titrated to patient response per physician's verbal order, Versed 6 mg IV  DESCRIPTION OF PROCEDURE:   After the risks benefits and alternatives of the procedure were thoroughly explained, informed consent was obtained.  Digital rectal exam was performed and revealed no rectal masses.   The LB [id number] endoscope was introduced through the anus and advanced to the cecum, which was identified by both the appendix and ileocecal valve, without limitations.  The quality of the prep was good..  The instrument was then slowly withdrawn as the colon was fully examined. <<PROCEDUREIMAGES>> FINDINGS:  A sessile polyp was found in the sigmoid colon. This was 4mm across, removed with cold snare and sent to pathology (jar 1) (see image3).  This was otherwise a normal examination of the colon (see image1, image2, and image4).   Retroflexed views in the rectum revealed no abnormalities. COMPLICATIONS:  None  ENDOSCOPIC IMPRESSION: 1) Sessile polyp in the sigmoid colon; this was removed and sent to pathology 2) Otherwise normal examination  RECOMMENDATIONS: 1) If the polyp(s) removed today are proven to be adenomatous (pre-cancerous) polyps, you will need a repeat colonoscopy in 5 years. Otherwise you should continue to follow colorectal cancer screening guidelines for "routine risk" patients with colonoscopy in 10 years. You will receive a letter within 1-2 weeks with the results of your biopsy as well as final recommendations.  Please call my office if you have not received a letter after 3 weeks.  ______________________________ Rachael Fee, MD  n. eSIGNED:   Rachael Fee at 09/12/2011 02:13 PM  Yolanda Manges, [id number]

## 2011-09-12 NOTE — Patient Instructions (Addendum)
YOU HAD AN ENDOSCOPIC PROCEDURE TODAY AT THE [facility]: Refer to the procedure report that was given to you for any specific questions about what was found during the examination.  If the procedure report does not answer your questions, please call your gastroenterologist to clarify.  If you requested that your care partner not be given the details of your procedure findings, then the procedure report has been included in a sealed envelope for you to review at your convenience later.  YOU SHOULD EXPECT: Some feelings of bloating in the abdomen. Passage of more gas than usual.  Walking can help get rid of the air that was put into your GI tract during the procedure and reduce the bloating. If you had a lower endoscopy (such as a colonoscopy or flexible sigmoidoscopy) you may notice spotting of blood in your stool or on the toilet paper. If you underwent a bowel prep for your procedure, then you may not have a normal bowel movement for a few days.  DIET: Your first meal following the procedure should be a light meal and then it is ok to progress to your normal diet.  A half-sandwich or bowl of soup is an example of a good first meal.  Heavy or fried foods are harder to digest and may make you feel nauseous or bloated.  Likewise meals heavy in dairy and vegetables can cause extra gas to form and this can also increase the bloating.  Drink plenty of fluids but you should avoid alcoholic beverages for 24 hours.  ACTIVITY: Your care partner should take you home directly after the procedure.  You should plan to take it easy, moving slowly for the rest of the day.  You can resume normal activity the day after the procedure however you should NOT DRIVE or use heavy machinery for 24 hours (because of the sedation medicines used during the test).    SYMPTOMS TO REPORT IMMEDIATELY: A gastroenterologist can be reached at any hour.  During normal business hours, 8:30 AM to 5:00 PM Monday through Friday,  call [phone].  After hours and on weekends, please call the GI answering service at [phone] who will take a message and have the physician on call contact you.   Following lower endoscopy (colonoscopy or flexible sigmoidoscopy):  Excessive amounts of blood in the stool  Significant tenderness or worsening of abdominal pains  Swelling of the abdomen that is new, acute  Fever of 100F or higher  Following upper endoscopy (EGD)  Vomiting of blood or coffee ground material  New chest pain or pain under the shoulder blades  Painful or persistently difficult swallowing  New shortness of breath  Fever of 100F or higher  Black, tarry-looking stools  FOLLOW UP: If any biopsies were taken you will be contacted by phone or by letter within the next 1-3 weeks.  Call your gastroenterologist if you have not heard about the biopsies in 3 weeks.  Our staff will call the home number listed on your records the next business day following your procedure to check on you and address any questions or concerns that you may have at that time regarding the information given to you following your procedure. This is a courtesy call and so if there is no answer at the home number and we have not heard from you through the emergency physician on call, we will assume that you have returned to your regular daily activities without incident.  SIGNATURES/CONFIDENTIALITY: You and/or your care   partner have signed paperwork which will be entered into your electronic medical record.  These signatures attest to the fact that that the information above on your After Visit Summary has been reviewed and is understood.  Full responsibility of the confidentiality of this discharge information lies with you and/or your care-partner.  

## 2011-09-15 ENCOUNTER — Telehealth: Payer: Self-pay | Admitting: *Deleted

## 2011-09-15 NOTE — Op Note (Signed)
 [facility] [address] [place], [place]  [address]  ENDOSCOPY PROCEDURE REPORT  PATIENT:  [name], [name]  MR#:  [id number] BIRTHDATE:  18-Aug-1960, 51 yrs. old  GENDER:  female ENDOSCOPIST:  Rachael Fee, MD PROCEDURE DATE:  09/12/2011 PROCEDURE:  EGD with biopsy, 43239 ASA CLASS:  Class II INDICATIONS:  nausea MEDICATIONS:  There was residual sedation effect present from prior procedure., These medications were titrated to patient response per physician's verbal order, Versed 3 mg IV TOPICAL ANESTHETIC:  none  DESCRIPTION OF PROCEDURE:   After the risks benefits and alternatives of the procedure were thoroughly explained, informed consent was obtained.  [facility] [id number] endoscope was introduced through the mouth and advanced to the second portion of the duodenum, without limitations.  The instrument was slowly withdrawn as the mucosa was fully examined. <<PROCEDUREIMAGES>> There was mild, non-specific gastritis.  Biopsies taken and sent to pathology (jar 1) (see image3).  Otherwise the examination was normal (see image2, image1, image4, and image5).    Retroflexed views revealed no abnormalities.    The scope was then withdrawn from the patient and the procedure completed. COMPLICATIONS:  None  ENDOSCOPIC IMPRESSION: 1) Mild gastritis, this was biopsied to check for H. pylori 2) Otherwise normal examination  RECOMMENDATIONS: If biopsies show H. pylori, then you will be started on appropriate antibiotics.  ______________________________ Rachael Fee, MD  cc: Harriette Bouillon, MD  n. Rosalie Doctor:   Rachael Fee at 09/12/2011 02:27 PM  Yolanda Manges, [phone]

## 2011-09-15 NOTE — Telephone Encounter (Signed)
  Follow up Call-  Call back number 09/12/2011  Post procedure Call Back phone  # [phone]  Permission to leave phone message Yes     Patient questions:  Do you have a fever, pain , or abdominal swelling? no Pain Score  0 *  Have you tolerated food without any problems? yes  Have you been able to return to your normal activities? yes  Do you have any questions about your discharge instructions: Diet   no Medications  no Follow up visit  no  Do you have questions or concerns about your Care? no  Actions: * If pain score is 4 or above: No action needed, pain <4.

## 2011-09-23 ENCOUNTER — Encounter: Payer: Self-pay | Admitting: Gastroenterology

## 2012-03-17 HISTORY — PX: KNEE SURGERY: SHX244

## 2012-06-23 ENCOUNTER — Emergency Department (HOSPITAL_COMMUNITY): Payer: BC Managed Care – PPO

## 2012-06-23 ENCOUNTER — Emergency Department (HOSPITAL_COMMUNITY)
Admission: EM | Admit: 2012-06-23 | Discharge: 2012-06-23 | Disposition: A | Discharge status: Home / Self Care | Payer: BC Managed Care – PPO | Attending: Emergency Medicine | Admitting: Emergency Medicine

## 2012-06-23 ENCOUNTER — Encounter (HOSPITAL_COMMUNITY): Payer: Self-pay | Admitting: Family Medicine

## 2012-06-23 DIAGNOSIS — M25562 Pain in left knee: Secondary | ICD-10-CM

## 2012-06-23 DIAGNOSIS — E119 Type 2 diabetes mellitus without complications: Secondary | ICD-10-CM | POA: Insufficient documentation

## 2012-06-23 DIAGNOSIS — Z79899 Other long term (current) drug therapy: Secondary | ICD-10-CM | POA: Insufficient documentation

## 2012-06-23 DIAGNOSIS — Z87828 Personal history of other (healed) physical injury and trauma: Secondary | ICD-10-CM | POA: Insufficient documentation

## 2012-06-23 DIAGNOSIS — R209 Unspecified disturbances of skin sensation: Secondary | ICD-10-CM | POA: Insufficient documentation

## 2012-06-23 DIAGNOSIS — M6281 Muscle weakness (generalized): Secondary | ICD-10-CM | POA: Insufficient documentation

## 2012-06-23 DIAGNOSIS — Z880 Allergy status to penicillin: Secondary | ICD-10-CM | POA: Insufficient documentation

## 2012-06-23 DIAGNOSIS — IMO0002 Reserved for concepts with insufficient information to code with codable children: Secondary | ICD-10-CM | POA: Insufficient documentation

## 2012-06-23 DIAGNOSIS — Z8719 Personal history of other diseases of the digestive system: Secondary | ICD-10-CM | POA: Insufficient documentation

## 2012-06-23 DIAGNOSIS — M129 Arthropathy, unspecified: Secondary | ICD-10-CM | POA: Insufficient documentation

## 2012-06-23 DIAGNOSIS — Z7982 Long term (current) use of aspirin: Secondary | ICD-10-CM | POA: Insufficient documentation

## 2012-06-23 DIAGNOSIS — Y9289 Other specified places as the place of occurrence of the external cause: Secondary | ICD-10-CM | POA: Insufficient documentation

## 2012-06-23 DIAGNOSIS — W010XXA Fall on same level from slipping, tripping and stumbling without subsequent striking against object, initial encounter: Secondary | ICD-10-CM | POA: Insufficient documentation

## 2012-06-23 DIAGNOSIS — M25569 Pain in unspecified knee: Secondary | ICD-10-CM | POA: Insufficient documentation

## 2012-06-23 DIAGNOSIS — Y9301 Activity, walking, marching and hiking: Secondary | ICD-10-CM | POA: Insufficient documentation

## 2012-06-23 MED ORDER — HYDROCODONE-ACETAMINOPHEN 10-325 MG PO TABS: 1.0000 | ORAL_TABLET | Freq: Four times a day (QID) | ORAL | Status: DC | PRN

## 2012-06-23 NOTE — Progress Notes (Signed)
Orthopedic Tech Progress Note Patient Details:  [name] 1960/07/07 [id number]  Ortho Devices Type of Ortho Device: Knee Immobilizer Ortho Device/Splint Location: (L) LE Ortho Device/Splint Interventions: Ordered;Application   Jennye Moccasin 06/23/2012, 4:45 PM

## 2012-06-23 NOTE — ED Notes (Signed)
sts was walking across the driveway and tripped and fell. sts left knee pain. sts cant put any weight on it. Denies hitting head when she fell.

## 2012-06-23 NOTE — Progress Notes (Signed)
Orthopedic Tech Progress Note Patient Details:  BELLAMARIE PFLUG Aug 30, 1960 [id number] Patient unable to use crutches properly. Patient ID: LEANN MAYWEATHER, female   DOB: Feb 10, 1961, 52 y.o.   MRN: [id number]   Jennye Moccasin 06/23/2012, 4:46 PM

## 2012-06-23 NOTE — ED Provider Notes (Signed)
History    This chart was scribed for non-physician practitioner Arthor Captain, PA-C working with Joya Gaskins, MD by Gerlean Ren, ED Scribe. This patient was seen in room TR06C/TR06C and the patient's care was started at 3:59 PM    CSN: [id number]  Arrival date & time 06/23/12  1440   First MD Initiated Contact with Patient 06/23/12 1507      Chief Complaint  Patient presents with  . Knee Injury    The history is provided by the patient. No language interpreter was used.  [name] is a 52 y.o. female who presents to the Emergency Department complaining of constant left anterior knee pain with sudden onset after tripping and landing on hard ground directly on bilateral knees at 2:00 PM today.  Some right knee pain with abrasions bilaterally, but left knee pain is complaint.  Pain worsened with bearing weight, flexion, and extension and is always on anterior surface of knee.  No head trauma or LOC with fall.  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  Past Medical History  Diagnosis Date  . Diabetes mellitus 04/17/2008  . Arthritis 03/05/2010  . Gallstones 05/31/2011  . Numbness in both hands parts of both hands    all the time since cervical fusion  . Numbness in feet     numbness parts of both feet since cervical fusion    Past Surgical History  Procedure Laterality Date  . Cervical fusion  04/17/08  .  endometria lablation  02-2005  . Cholecystectomy  07/01/2011    Procedure: LAPAROSCOPIC CHOLECYSTECTOMY WITH INTRAOPERATIVE CHOLANGIOGRAM;  Surgeon: Clovis Pu. Cornett, MD;  Location: [facility];  Service: General;  Laterality: N/A;    Family History  Problem Relation Age of Onset  . Stroke Father   . Diabetes Father   . Heart disease Father   . Lung cancer Maternal Aunt   . Diabetes Maternal Grandfather   . Heart disease Maternal Grandfather   . Liver cancer Maternal Aunt   . Colon cancer Neg Hx      History  Substance Use Topics  . Smoking status: Never Smoker   . Smokeless tobacco: Never Used  . Alcohol Use: No    No OB history provided.   Review of Systems Ten systems reviewed and are negative for acute change, except as noted in the HPI.    Allergies  Penicillins  Home Medications   Current Outpatient Rx  Name  Route  Sig  Dispense  Refill  . acetaminophen (TYLENOL) 500 MG tablet   Oral   Take 1,000 mg by mouth every 6 (six) hours as needed. Pain          . aspirin EC 81 MG tablet   Oral   Take 81 mg by mouth at bedtime.          Marland Kitchen BAYER CONTOUR NEXT TEST test strip               . celecoxib (CELEBREX) 200 MG capsule   Oral   Take 200 mg by mouth daily as needed. For arthritis pain         . metFORMIN (GLUCOPHAGE) 1000 MG tablet   Oral   Take 1,000 mg by mouth 2 (two) times daily with a meal.          . metroNIDAZOLE (METROGEL) 0.75 % gel   Topical   Apply 1 application  topically daily.         . promethazine (PHENERGAN) 25 MG tablet   Oral   Take 25 mg by mouth every 6 (six) hours as needed.           BP 158/76  Pulse 83  Temp(Src) 98.7 F (37.1 C) (Oral)  Resp 20  SpO2 100%  Physical Exam  Nursing note and vitals reviewed. Constitutional: She is oriented to person, place, and time. She appears well-developed and well-nourished. No distress.  HENT:  Head: Normocephalic and atraumatic.  Eyes: EOM are normal.  Neck: Neck supple. No tracheal deviation present.  Cardiovascular: Normal rate.   Pulmonary/Chest: Effort normal. No respiratory distress.  Musculoskeletal: Normal range of motion.  Abrasions to bilateral knees, tenderness over left anterior knee worsened with flexion/extension. Crepitus and pain with palpation of the patella.   Neurological: She is alert and oriented to person, place, and time.  Weakness of righ hand grip strength , weakness of the right leg.   Skin: Skin is warm and dry.  Psychiatric: She has a  normal mood and affect. Her behavior is normal.    ED Course  Procedures (including critical care time) DIAGNOSTIC STUDIES: Oxygen Saturation is 100% on room air, normal by my interpretation.    COORDINATION OF CARE: 4:03 PM- Informed pt that I will discuss with attending to determine if further imaging is necessary to visualize possible patella fracture.  Pt verbalizes understanding.   4:11 PM- Discussed with pt that further imaging is not necessary because treatment plan will be same regardless of image findings.  Discussed knee immobilizer and crutches.  Pt reports h/o cervical spine surgery causing diminished hand strength in right hand and intermittent numbness in bilateral lower extremities.  Pt states she is willing to try crutches.  I informed pt I can order wheelchair if needed.  Pt understands.      1. Knee pain, left       MDM  Patient without abnormality of xray, however i suspect a patellar fracture.  Patient is unable to ambulate with crutches due to weakness and should not weight bear on leg until evaluated by ortho.  I have written a script for wheelchair. Patient given knee immobilizer and pain control.   I personally performed the services described in this documentation, which was scribed in my presence. The recorded information has been reviewed and is accurate.     Arthor Captain, PA-C 06/26/12 1420

## 2012-06-23 NOTE — ED Notes (Signed)
Ortho tech contacted. 

## 2012-06-23 NOTE — ED Notes (Signed)
The pt is alert she has abrasions to both knees from her fall.  More pain lt knee than rt

## 2012-06-23 NOTE — ED Notes (Signed)
Ortho tech here 

## 2012-06-27 NOTE — ED Provider Notes (Signed)
Medical screening examination/treatment/procedure(s) were performed by non-physician practitioner and as supervising physician I was immediately available for consultation/collaboration.   Joya Gaskins, MD 06/27/12 0830

## 2012-07-29 ENCOUNTER — Other Ambulatory Visit: Payer: Self-pay | Admitting: Orthopedic Surgery

## 2012-07-29 DIAGNOSIS — M25562 Pain in left knee: Secondary | ICD-10-CM

## 2012-07-30 ENCOUNTER — Ambulatory Visit
Admission: RE | Admit: 2012-07-30 | Discharge: 2012-07-30 | Disposition: A | Discharge status: Home / Self Care | Payer: BC Managed Care – PPO | Source: Ambulatory Visit | Attending: Orthopedic Surgery | Admitting: Orthopedic Surgery

## 2012-07-30 DIAGNOSIS — M25562 Pain in left knee: Secondary | ICD-10-CM

## 2013-11-02 ENCOUNTER — Telehealth: Payer: Self-pay | Admitting: Gastroenterology

## 2013-11-02 NOTE — Telephone Encounter (Signed)
Pt advised to follow antireflux measures and was given an appt to see Dr Ardis Hughs on 11/22/13 9 am she is currently taking Nexium BID

## 2013-11-20 IMAGING — US US ABDOMEN COMPLETE
1 series · 13 of 25 positions shown · non-contrast
Comparison: None

CLINICAL DATA: Abdominal pain.

ABDOMINAL ULTRASOUND COMPLETE

[Series 1: us abdomen complete · 0.30mm/px · 13 of 48 slices shown]
[im 1/48]
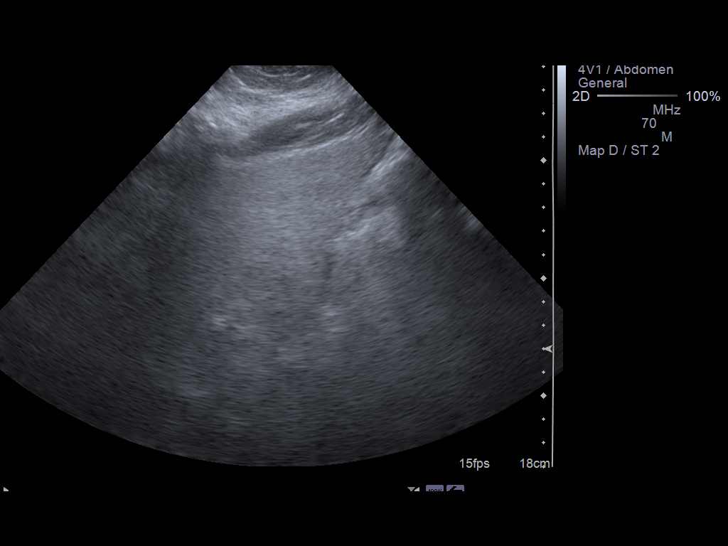
[im 4/48]
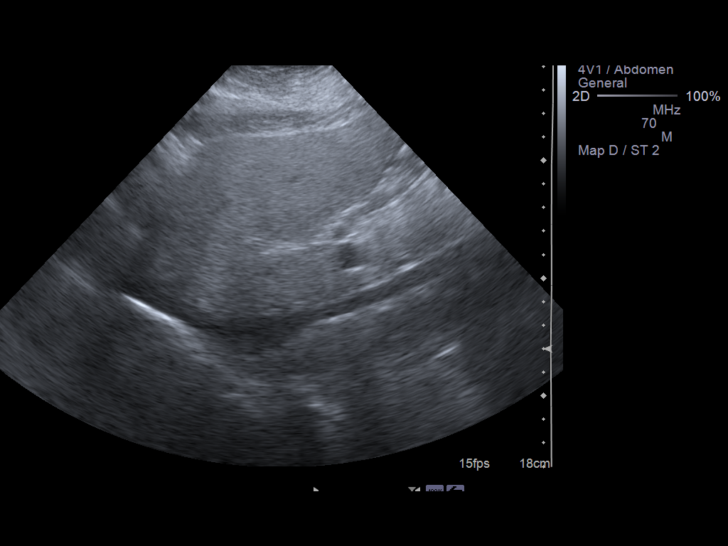
[im 8/48]
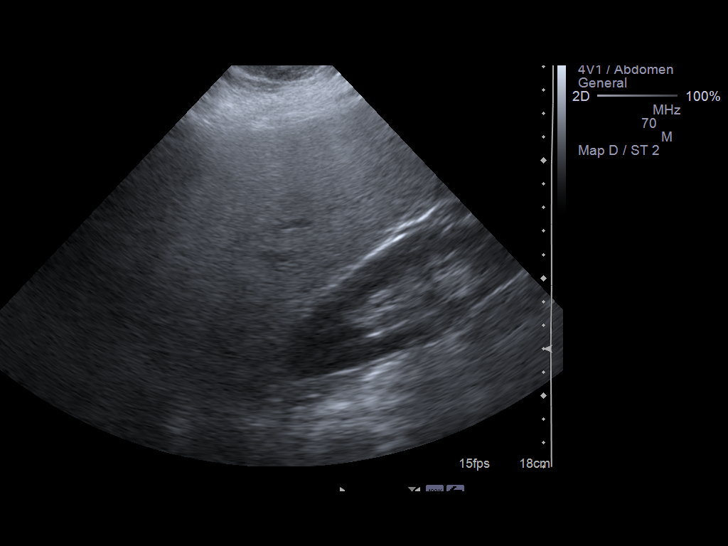
[im 12/48]
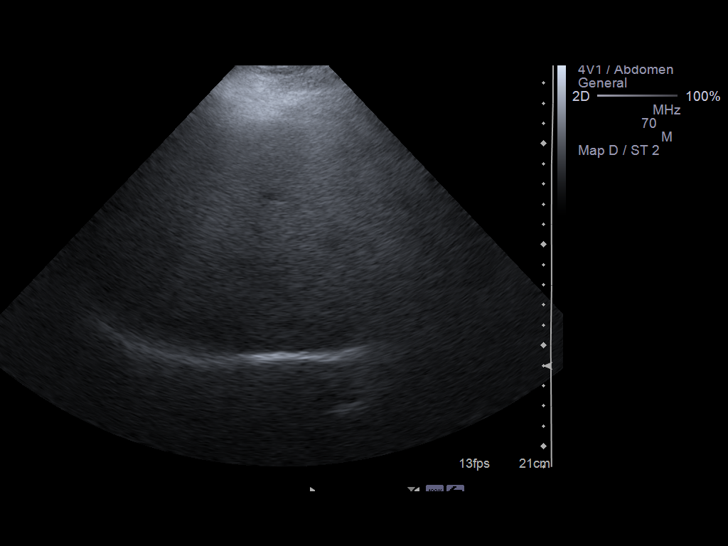
[im 16/48]
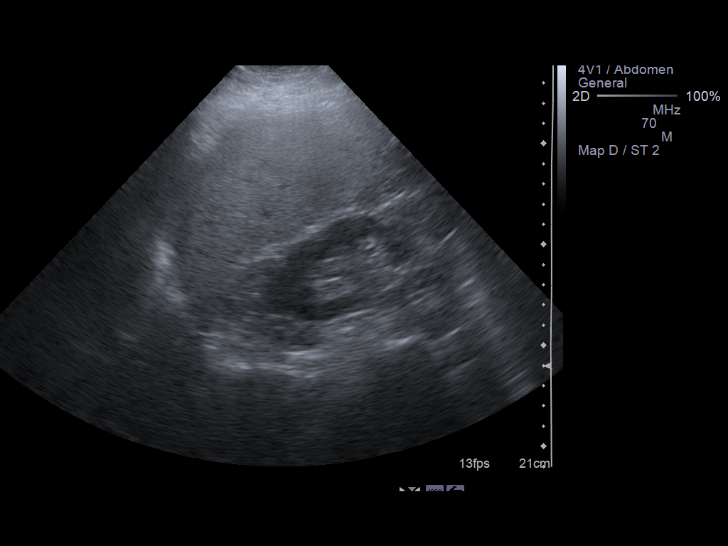
[im 20/48]
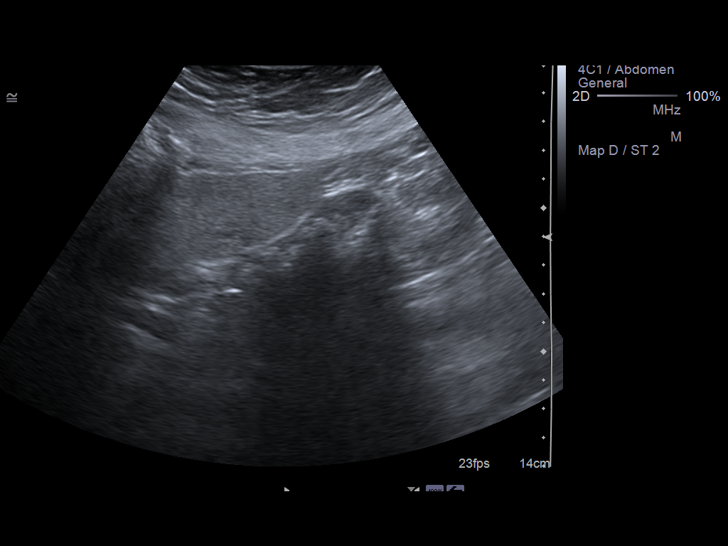
[im 24/48]
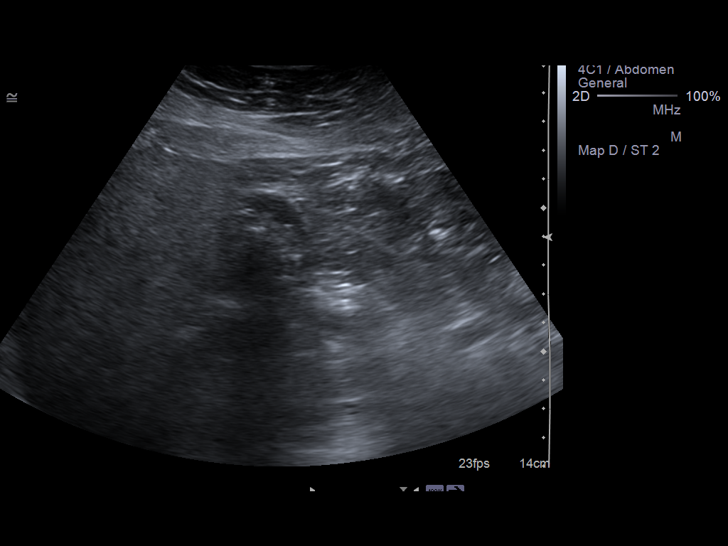
[im 28/48]
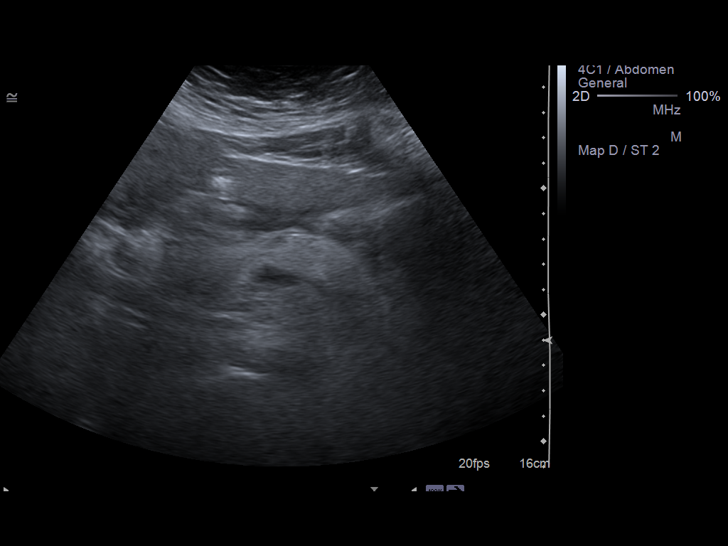
[im 32/48]
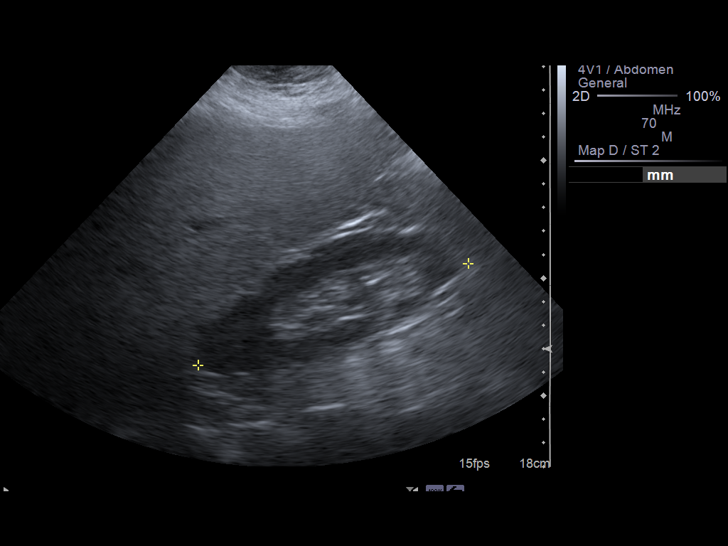
[im 36/48]
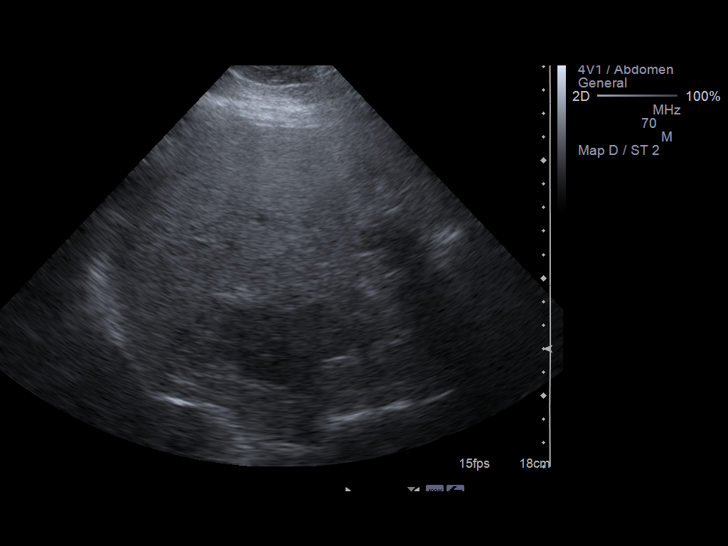
[im 40/48]
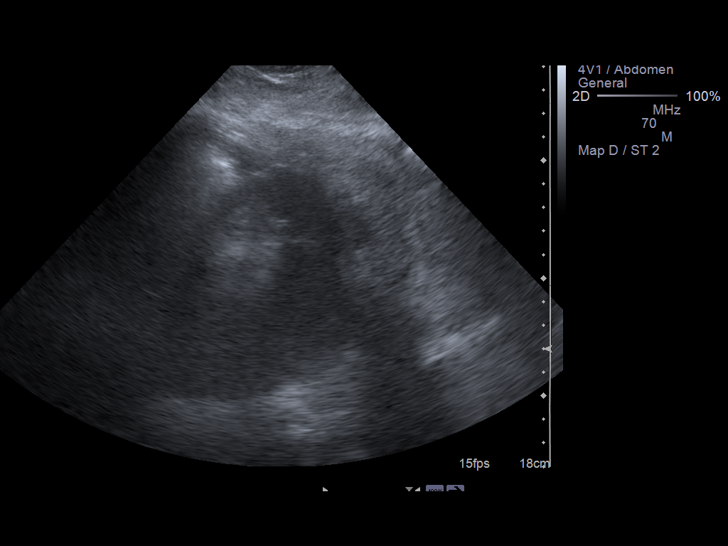
[im 44/48]
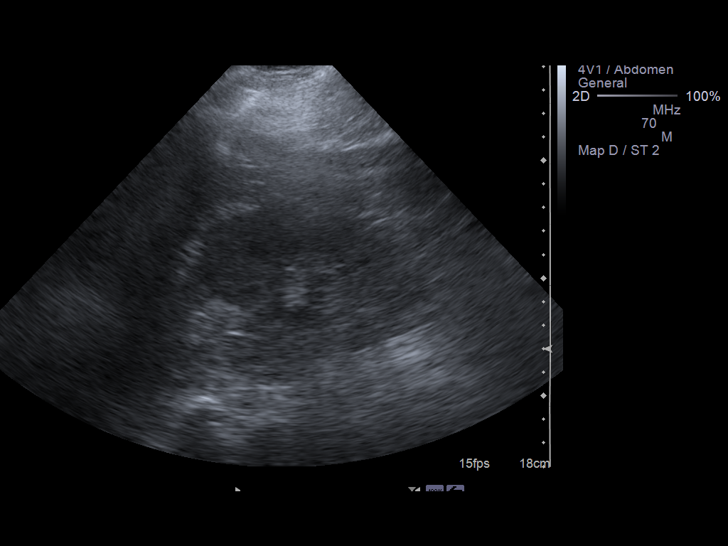
[im 48/48]
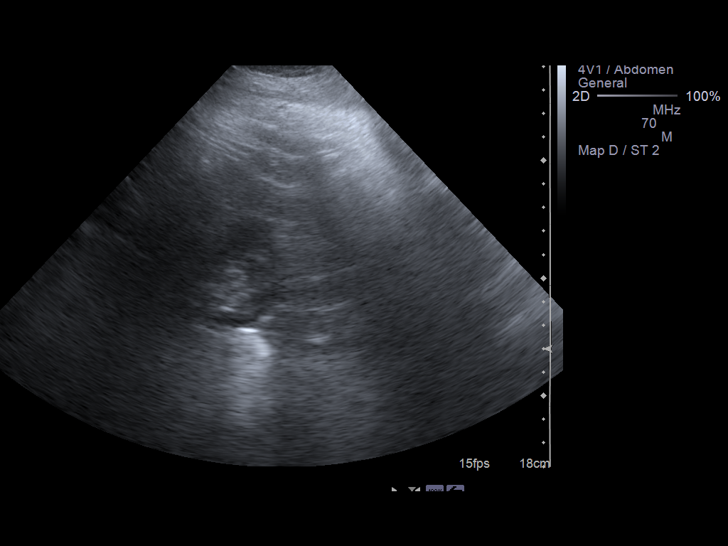

[13 of 25 positions shown; findings below may reference images not displayed]

FINDINGS: Gallbladder:  A wall echo shadow sign is noted, with multiple
stones filling the gallbladder.  No gallbladder wall thickening is
seen; no ultrasonographic Murphy's sign is elicited.  No
pericholecystic fluid is identified.

Common Bile Duct:  0.5 cm in diameter; within normal limits in
caliber.

Liver:  Diffusely increased hepatic echogenicity and coarsened
echotexture, compatible with fatty infiltration; no focal lesions
identified.  The liver is mildly enlarged.  Limited Doppler
evaluation demonstrates normal blood flow within the liver.

IVC:  Unremarkable in appearance.

Pancreas:  Although the pancreas is difficult to visualize in its
entirety due to overlying bowel gas, no focal pancreatic
abnormality is identified.

Spleen:  9.9 cm in length; within normal limits in size and
echotexture.

Right kidney:  12.3 cm in length; normal in size, configuration and
parenchymal echogenicity.  No evidence of mass or hydronephrosis.

Left kidney:  12.6 cm in length; normal in size, configuration and
parenchymal echogenicity.  No evidence of mass or hydronephrosis.

Abdominal Aorta:  Normal in caliber; no aneurysm identified.
IMPRESSION: 1.  Numerous stones filling the gallbladder, with a wall echo
shadow sign; no evidence for obstruction or cholecystitis.
2.  Diffuse fatty infiltration within the liver; mild hepatomegaly.

## 2013-11-20 IMAGING — CR DG CHEST 2V
2 series · 2 of 2 positions shown · non-contrast
Comparison: None.

CLINICAL DATA: Sternal chest pain.  History of diabetes.

CHEST - 2 VIEW

[w chest pa]
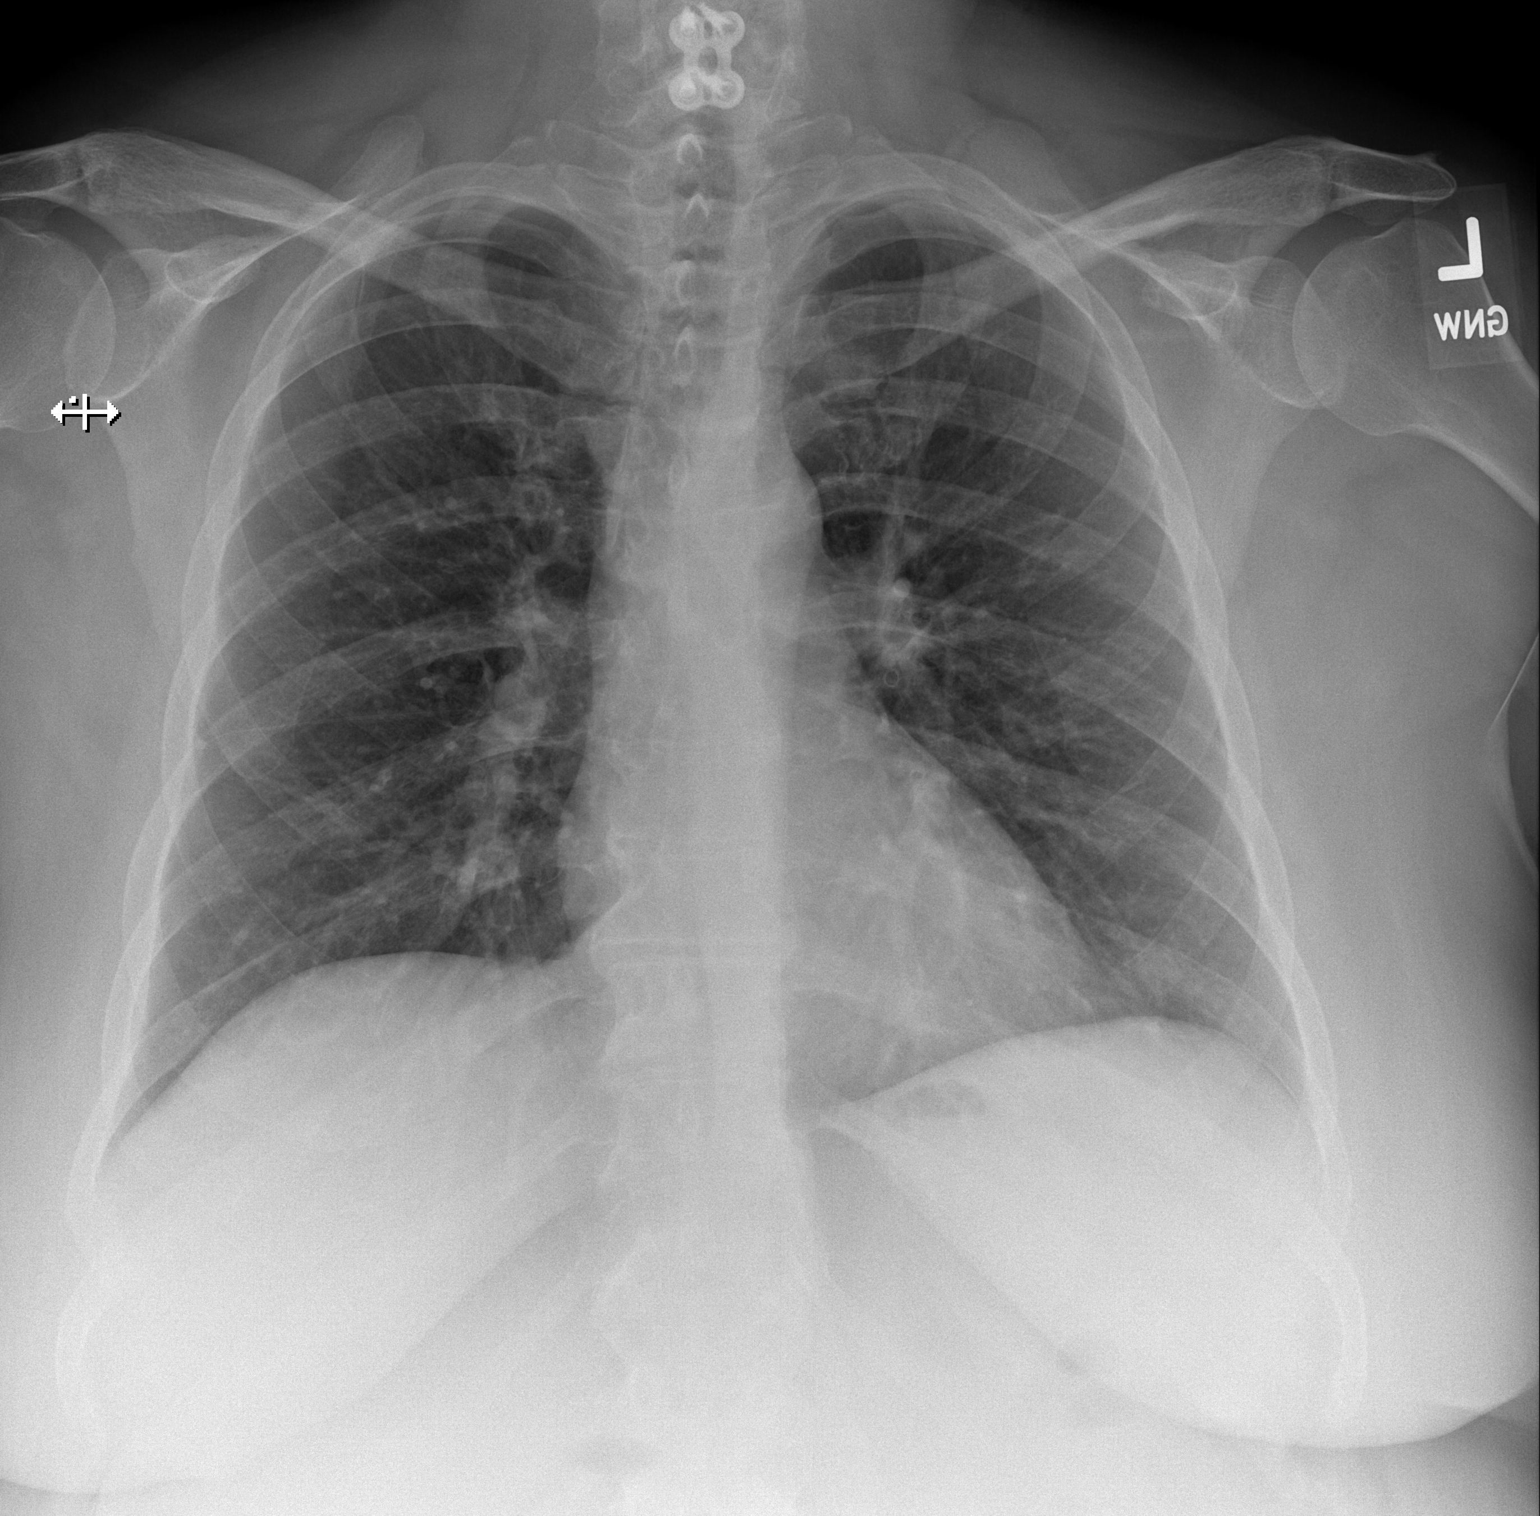

[w chest lat]
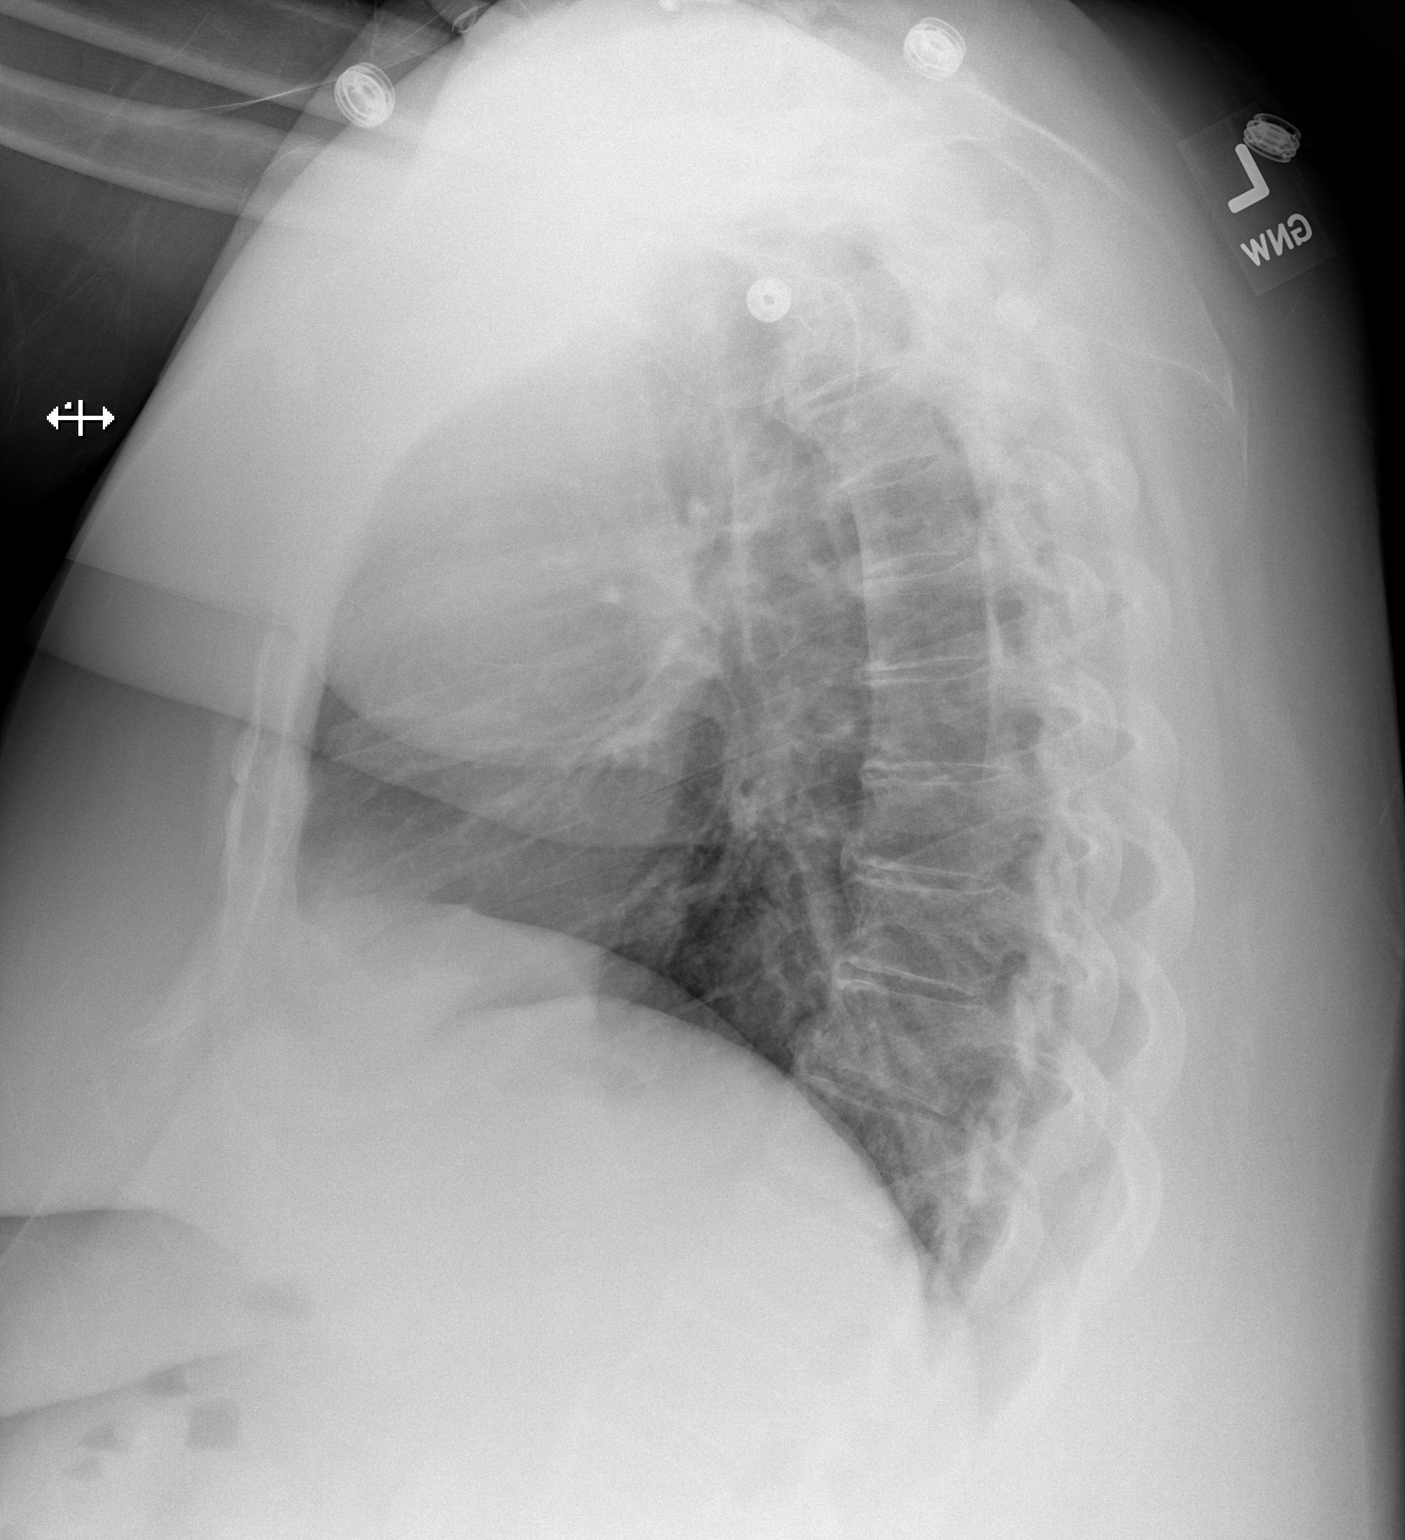

[2 of 2 positions shown; findings below may reference images not displayed]

FINDINGS: The lungs are well-aerated.  Mild likely chronic
peribronchial thickening is noted.  There is no evidence of focal
opacification, pleural effusion or pneumothorax.  A small calcified
granuloma is noted at the right midlung zone.

The heart is normal in size; the mediastinal contour is within
normal limits.  No acute osseous abnormalities are seen.  Cervical
fusion hardware is noted.
IMPRESSION: No acute cardiopulmonary process seen.

## 2013-11-22 ENCOUNTER — Ambulatory Visit (INDEPENDENT_AMBULATORY_CARE_PROVIDER_SITE_OTHER): Payer: BC Managed Care – PPO | Admitting: Gastroenterology

## 2013-11-22 ENCOUNTER — Encounter: Payer: Self-pay | Admitting: Gastroenterology

## 2013-11-22 VITALS — BP 128/70 | HR 72 | Ht 60.25 in | Wt 251.0 lb

## 2013-11-22 DIAGNOSIS — K219 Gastro-esophageal reflux disease without esophagitis: Secondary | ICD-10-CM

## 2013-11-22 NOTE — Patient Instructions (Signed)
You should change the way you are taking your antiacid medicine (nexium) so that you are taking it 20-30 minutes prior to your evening meal as that is the way the pill is designed to work most effectively. Call in 2 weeks to report on your symptoms.

## 2013-11-22 NOTE — Progress Notes (Signed)
Review of pertinent gastrointestinal problems: 1. cholecystectomy laparoscopically 06/2011 with Dr. Brantley Stage, this was for cholelithiasis and cholecystitis. IOC was normal. 2. Nausea (persistent after lap chole).  EGD Dr. Ardis Hughs 08/2011 found mild gastritis, neg for h. ppylori on path 3. Tubular adenoma:  Colonoscopy Ardis Hughs 08/2011 found single small polyp, was TA on path, recommended repeat in 5 years.  HPI: This is a    very pleasant 53 year old woman.  Last seen about 2 years ago.  Since the first of July, major dietary changes.  Noticed severe burning in chest, pain in epigastrium.  She saw PCP, was given samples of dexlinant a script for nexium, then told to start OTC prilosec.  Transitioning to OTC prilosec didn't work for her.  Now back on nexium.  Watching spicey foods.  No dysphagia.  Has lost 30 pounds since June.  Will take nexium once daily in AM 60-90 min before BF.   Drinks coffee in AM.  Not much other cafinated beverages.  Drinks diet ginger ale, rare.  Pyrosis wort at night 8pm.  Review of systems: Pertinent positive and negative review of systems were noted in the above HPI section. Complete review of systems was performed and was otherwise normal.    Past Medical History  Diagnosis Date  . Diabetes mellitus 04/17/2008  . Arthritis 03/05/2010  . Gallstones 05/31/2011  . Numbness in both hands parts of both hands    all the time since cervical fusion  . Numbness in feet     numbness parts of both feet since cervical fusion  . Obesity     Past Surgical History  Procedure Laterality Date  . Cervical fusion  04/17/08  .  endometria lablation  02-2005  . Cholecystectomy  07/01/2011    Procedure: LAPAROSCOPIC CHOLECYSTECTOMY WITH INTRAOPERATIVE CHOLANGIOGRAM;  Surgeon: Joyice Faster. Cornett, MD;  Location: [facility];  Service: General;  Laterality: N/A;  . Knee surgery Left 2014    Torn meniscus    Current Outpatient Prescriptions  Medication Sig Dispense Refill  .  Canagliflozin-Metformin HCl (INVOKAMET) 50-1000 MG TABS Take 50-1,000 tablets by mouth daily.      . celecoxib (CELEBREX) 200 MG capsule Take 200 mg by mouth daily as needed. For arthritis pain      . cetirizine (ZYRTEC) 10 MG tablet Take 10 mg by mouth daily as needed for allergies.      Marland Kitchen esomeprazole (NEXIUM) 40 MG capsule Take 40 mg by mouth daily at 12 noon.      . metroNIDAZOLE (METROGEL) 0.75 % gel Apply 1 application topically daily.       No current facility-administered medications for this visit.    Allergies as of 11/22/2013 - Review Complete 11/22/2013  Allergen Reaction Noted  . Penicillins Hives 05/31/2011    Family History  Problem Relation Age of Onset  . Stroke Father   . Diabetes Father   . Heart disease Father   . Lung cancer Maternal Aunt   . Diabetes Maternal Grandfather   . Heart disease Maternal Grandfather   . Liver cancer Maternal Aunt   . Colon cancer Neg Hx   . Colon polyps Mother     History   Social History  . Marital Status: Single    Spouse Name: N/A    Number of Children: 0  . Years of Education: N/A   Occupational History  . purchasing agent [employer]   Social History Main Topics  . Smoking status: Never Smoker   . Smokeless tobacco:  Never Used  . Alcohol Use: No  . Drug Use: No  . Sexual Activity:    Other Topics Concern  . Not on file   Social History Narrative  . No narrative on file       Physical Exam: BP 128/70  Pulse 72  Ht 5' 0.25" (1.53 m)  Wt 251 lb (113.853 kg)  BMI 48.64 kg/m2 Constitutional: generally well-appearing Psychiatric: alert and oriented x3 Eyes: extraocular movements intact Mouth: oral pharynx moist, no lesions Neck: supple no lymphadenopathy Cardiovascular: heart regular rate and rhythm Lungs: clear to auscultation bilaterally Abdomen: soft, nontender, nondistended, no obvious ascites, no peritoneal signs, normal bowel sounds Extremities: no lower extremity edema  bilaterally Skin: no lesions on visible extremities    Assessment and plan: 53 y.o. female with  pyrosis, likely GERD without alarm symptoms  She has been losing weight however she has made major dietary changes recently I suspect it is more from that then and he malignant etiology. Her GERD symptoms are well controlled on proton pump inhibitor dexilant but not quite as well controlled on Nexium. She is not taking the Nexium at the correct time and relation to being and I would also prefer that she take it shortly before her evening meal since her symptoms are worse in the evening hours. She will make this change and she will call back in 2 weeks to report on her symptoms. Following that I would like to double her proton pump inhibitor prior to repeating endoscopy which she had 2 years ago.

## 2013-12-06 ENCOUNTER — Other Ambulatory Visit: Payer: Self-pay

## 2013-12-06 ENCOUNTER — Telehealth: Payer: Self-pay

## 2013-12-06 MED ORDER — ESOMEPRAZOLE MAGNESIUM 40 MG PO CPDR: 40.0000 mg | DELAYED_RELEASE_CAPSULE | Freq: Two times a day (BID) | ORAL | Status: DC

## 2013-12-06 NOTE — Telephone Encounter (Signed)
The patient is calling to report on her condition, she states that the nexium is not helping she is on 40 mg daily.  She takes it 30 minutes prior to dinner.  Please advise

## 2013-12-06 NOTE — Telephone Encounter (Signed)
Pt aware and prescription sent pt request name brand only

## 2013-12-06 NOTE — Telephone Encounter (Signed)
She needs to double the nexium to 40mg  twice daily 20-30 min prior to BF and dinner meals. She should call to report in 3-4 weeks.  May need new script.

## 2014-01-17 ENCOUNTER — Telehealth: Payer: Self-pay | Admitting: Gastroenterology

## 2014-01-17 NOTE — Telephone Encounter (Signed)
The patient has been notified of this information and all questions answered.

## 2014-01-17 NOTE — Telephone Encounter (Signed)
Great to hear, she should continue nexium once daily

## 2014-11-22 ENCOUNTER — Other Ambulatory Visit: Payer: Self-pay | Admitting: Family Medicine

## 2014-11-22 DIAGNOSIS — N6310 Unspecified lump in the right breast, unspecified quadrant: Secondary | ICD-10-CM

## 2014-11-28 ENCOUNTER — Ambulatory Visit
Admission: RE | Admit: 2014-11-28 | Discharge: 2014-11-28 | Disposition: A | Discharge status: Home / Self Care | Payer: BC Managed Care – PPO | Source: Ambulatory Visit | Attending: Family Medicine | Admitting: Family Medicine

## 2014-11-28 ENCOUNTER — Other Ambulatory Visit: Payer: BC Managed Care – PPO

## 2014-11-28 ENCOUNTER — Other Ambulatory Visit: Payer: Self-pay | Admitting: Family Medicine

## 2014-11-28 DIAGNOSIS — N6320 Unspecified lump in the left breast, unspecified quadrant: Secondary | ICD-10-CM

## 2014-11-28 DIAGNOSIS — N6310 Unspecified lump in the right breast, unspecified quadrant: Secondary | ICD-10-CM

## 2015-06-18 ENCOUNTER — Ambulatory Visit (HOSPITAL_COMMUNITY)
Admission: RE | Admit: 2015-06-18 | Discharge: 2015-06-18 | Disposition: A | Discharge status: Home / Self Care | Payer: BC Managed Care – PPO | Source: Ambulatory Visit | Attending: Endocrinology | Admitting: Endocrinology

## 2015-06-18 ENCOUNTER — Other Ambulatory Visit (HOSPITAL_COMMUNITY): Payer: Self-pay | Admitting: Endocrinology

## 2015-06-18 ENCOUNTER — Encounter (HOSPITAL_COMMUNITY): Payer: Self-pay

## 2015-06-18 DIAGNOSIS — D72829 Elevated white blood cell count, unspecified: Secondary | ICD-10-CM | POA: Diagnosis not present

## 2015-06-18 DIAGNOSIS — R112 Nausea with vomiting, unspecified: Secondary | ICD-10-CM

## 2015-06-18 DIAGNOSIS — K439 Ventral hernia without obstruction or gangrene: Secondary | ICD-10-CM | POA: Insufficient documentation

## 2015-06-18 DIAGNOSIS — M4807 Spinal stenosis, lumbosacral region: Secondary | ICD-10-CM | POA: Diagnosis not present

## 2015-06-18 DIAGNOSIS — R1013 Epigastric pain: Secondary | ICD-10-CM

## 2015-06-18 DIAGNOSIS — R1900 Intra-abdominal and pelvic swelling, mass and lump, unspecified site: Secondary | ICD-10-CM | POA: Diagnosis not present

## 2015-06-18 DIAGNOSIS — M4806 Spinal stenosis, lumbar region: Secondary | ICD-10-CM | POA: Insufficient documentation

## 2015-06-18 DIAGNOSIS — K760 Fatty (change of) liver, not elsewhere classified: Secondary | ICD-10-CM | POA: Insufficient documentation

## 2015-06-18 MED ORDER — IOPAMIDOL (ISOVUE-300) INJECTION 61%
100.0000 mL | Freq: Once | INTRAVENOUS | Status: AC | PRN
  Administered 2015-06-18: 100 mL via INTRAVENOUS

## 2015-06-28 ENCOUNTER — Other Ambulatory Visit (HOSPITAL_COMMUNITY): Payer: Self-pay | Admitting: Endocrinology

## 2015-06-28 DIAGNOSIS — R1900 Intra-abdominal and pelvic swelling, mass and lump, unspecified site: Secondary | ICD-10-CM

## 2015-08-09 ENCOUNTER — Encounter (HOSPITAL_COMMUNITY): Payer: Self-pay

## 2015-08-09 ENCOUNTER — Ambulatory Visit (HOSPITAL_COMMUNITY)
Admission: RE | Admit: 2015-08-09 | Discharge: 2015-08-09 | Disposition: A | Discharge status: Home / Self Care | Payer: BC Managed Care – PPO | Source: Ambulatory Visit | Attending: Endocrinology | Admitting: Endocrinology

## 2015-08-09 DIAGNOSIS — E279 Disorder of adrenal gland, unspecified: Secondary | ICD-10-CM | POA: Diagnosis not present

## 2015-08-09 DIAGNOSIS — R1013 Epigastric pain: Secondary | ICD-10-CM | POA: Insufficient documentation

## 2015-08-09 DIAGNOSIS — N83202 Unspecified ovarian cyst, left side: Secondary | ICD-10-CM | POA: Insufficient documentation

## 2015-08-09 DIAGNOSIS — R197 Diarrhea, unspecified: Secondary | ICD-10-CM | POA: Diagnosis not present

## 2015-08-09 DIAGNOSIS — R1110 Vomiting, unspecified: Secondary | ICD-10-CM | POA: Insufficient documentation

## 2015-08-09 DIAGNOSIS — R1900 Intra-abdominal and pelvic swelling, mass and lump, unspecified site: Secondary | ICD-10-CM

## 2015-08-09 DIAGNOSIS — D72829 Elevated white blood cell count, unspecified: Secondary | ICD-10-CM | POA: Diagnosis not present

## 2015-08-09 MED ORDER — IOPAMIDOL (ISOVUE-300) INJECTION 61%
100.0000 mL | Freq: Once | INTRAVENOUS | Status: AC | PRN
  Administered 2015-08-09: 100 mL via INTRAVENOUS

## 2015-08-09 MED ORDER — DIATRIZOATE MEGLUMINE & SODIUM 66-10 % PO SOLN
30.0000 mL | Freq: Once | ORAL | Status: AC
  Administered 2015-08-09: 30 mL via ORAL

## 2016-01-15 ENCOUNTER — Other Ambulatory Visit: Payer: Self-pay | Admitting: Obstetrics & Gynecology

## 2016-01-15 DIAGNOSIS — Z1231 Encounter for screening mammogram for malignant neoplasm of breast: Secondary | ICD-10-CM

## 2016-01-30 ENCOUNTER — Ambulatory Visit
Admission: RE | Admit: 2016-01-30 | Discharge: 2016-01-30 | Disposition: A | Discharge status: Home / Self Care | Payer: BC Managed Care – PPO | Source: Ambulatory Visit | Attending: Obstetrics & Gynecology | Admitting: Obstetrics & Gynecology

## 2016-01-30 DIAGNOSIS — Z1231 Encounter for screening mammogram for malignant neoplasm of breast: Secondary | ICD-10-CM

## 2016-08-25 ENCOUNTER — Encounter (INDEPENDENT_AMBULATORY_CARE_PROVIDER_SITE_OTHER): Payer: Self-pay

## 2016-08-25 ENCOUNTER — Encounter: Payer: Self-pay | Admitting: Physician Assistant

## 2016-08-25 ENCOUNTER — Ambulatory Visit (INDEPENDENT_AMBULATORY_CARE_PROVIDER_SITE_OTHER): Payer: BC Managed Care – PPO | Admitting: Physician Assistant

## 2016-08-25 VITALS — BP 128/84 | HR 72 | Ht 63.0 in | Wt 263.2 lb

## 2016-08-25 DIAGNOSIS — Z1211 Encounter for screening for malignant neoplasm of colon: Secondary | ICD-10-CM | POA: Diagnosis not present

## 2016-08-25 DIAGNOSIS — Z86010 Personal history of colonic polyps: Secondary | ICD-10-CM | POA: Diagnosis not present

## 2016-08-25 DIAGNOSIS — K625 Hemorrhage of anus and rectum: Secondary | ICD-10-CM | POA: Diagnosis not present

## 2016-08-25 MED ORDER — NA SULFATE-K SULFATE-MG SULF 17.5-3.13-1.6 GM/177ML PO SOLN: 1.0000 | Freq: Once | ORAL | 0 refills | Status: AC

## 2016-08-25 NOTE — Patient Instructions (Addendum)
You have been scheduled for a colonoscopy. Please follow written instructions given to you at your visit today.  Please pick up your prep supplies at the pharmacy within the next 1-3 days. [facility], [place], [place].  If you use inhalers (even only as needed), please bring them with you on the day of your procedure. Your physician has requested that you go to www.startemmi.com and enter the access code given to you at your visit today. This web site gives a general overview about your procedure. However, you should still follow specific instructions given to you by our office regarding your preparation for the procedure.  We did put you on a cancellation list for a possible sooner appointment.

## 2016-08-25 NOTE — Progress Notes (Signed)
Subjective:    Patient ID: [name], female    DOB: 1961-01-07, 56 y.o.   MRN: [id number]  HPI [name] is a pleasant 56 year old white female, known to Dr. Ardis Hughs who comes in today with complaints of small amounts of rectal bleeding intermittently. She was last seen here in 2015. She has history of adenomatous colon polyps, morbid obesity, adult-onset diabetes mellitus, she is status post cholecystectomy and cervical fusion. EGD was done in June 2013 with finding of mild gastritis biopsies were benign with no H. pylori organisms.  Colonoscopy at that same setting with one 4 mm sessile polyp removed otherwise normal exam. Biopsy consistent with a tubular adenoma. Patient says she called to get scheduled for follow-up colonoscopy and mentioned that she had been seeing some bright red blood intermittently and office visit was scheduled. She denies any abdominal pain she says she has noticed a small amount of bleeding off and on over the past couple of months which seems to be more noticeable if she is not regular or if she has harder stools. She has no history of hemorrhoids. No complaints of rectal discomfort. She has just started on Benefiber.  Review of Systems Pertinent positive and negative review of systems were noted in the above HPI section.  All other review of systems was otherwise negative.  Outpatient Encounter Prescriptions as of 08/25/2016  Medication Sig  . Canagliflozin-Metformin HCl (INVOKAMET) 50-1000 MG TABS Take 50-1,000 tablets by mouth daily.  . celecoxib (CELEBREX) 200 MG capsule Take 200 mg by mouth daily as needed. For arthritis pain  . cetirizine (ZYRTEC) 10 MG tablet Take 10 mg by mouth daily as needed for allergies.  . Empagliflozin-Metformin HCl (SYNJARDY PO) Take by mouth 2 (two) times daily.  Marland Kitchen esomeprazole (NEXIUM) 40 MG capsule Take 1 capsule (40 mg total) by mouth 2 (two) times daily before a meal.  . metroNIDAZOLE (METROGEL) 0.75 % gel Apply 1 application  topically daily.  . Na Sulfate-K Sulfate-Mg Sulf 17.5-3.13-1.6 GM/180ML SOLN Take 1 kit by mouth once.   No facility-administered encounter medications on file as of 08/25/2016.    Allergies  Allergen Reactions  . Penicillins Hives   Patient Active Problem List   Diagnosis Date Noted  . Postoperative nausea and vomiting 07/07/2011   Social History   Social History  . Marital status: Single    Spouse name: N/A  . Number of children: 0  . Years of education: N/A   Occupational History  . purchasing agent [employer]   Social History Main Topics  . Smoking status: Never Smoker  . Smokeless tobacco: Never Used  . Alcohol use No  . Drug use: No  . Sexual activity: Not on file   Other Topics Concern  . Not on file   Social History Narrative  . No narrative on file    [name]'s family history includes Colon polyps in her mother; Diabetes in her father and maternal grandfather; Heart disease in her father and maternal grandfather; Liver cancer in her maternal aunt; Lung cancer in her maternal aunt; Stroke in her father.      Objective:    Vitals:   08/25/16 0921  BP: 128/84  Pulse: 72    Physical Exam  well-developed white female in no acute distress, blood pressure 128/84 pulse 72, height 5 foot 3, weight 263, BMI 46.6. HEENT; nontraumatic normocephalic EOMI PERRLA sclera anicteric, Cardiovascular; regular rate and rhythm with S1-S2 no murmur or gallop, Pulmonary; clear bilaterally, Abdomen;  morbidly obese, soft, nontender no palpable hepatosplenomegaly, she has a rounded fullness on the right side of her abdomen when lying supine which is nontender, on sitting unable to appreciate, Rectal ;exam not done, Extremities; no clubbing cyanosis or edema skin warm and dry, Neuropsych; mood and affect appropriate       Assessment & Plan:   #64  56 year old white female with history of adenomatous colon polyp at colonoscopy June 2013 due for follow-up  colonoscopy. #2 Two  month history of intermittent very small volume hematochezia-etiology not clear, possibly hemorrhoidal though no hemorrhoids identified at last colonoscopy. #3 adult-onset diabetes mellitus #4 status post cholecystectomy #5 morbid obesity-BMI 46 #6 status post cervical fusion #7 rounded protrusion of the right abdominal wall -question diastases    Plan; Patient will be scheduled for colonoscopy with Dr. Ardis Hughs. Procedure discussed in detail with the patient including risks and benefits and she is agreeable to proceed.  Amy Genia Harold PA-C 08/25/2016   Cc: Reynold Bowen, MD

## 2016-08-25 NOTE — Progress Notes (Signed)
I agree with the above note, plan 

## 2016-09-11 ENCOUNTER — Encounter: Payer: Self-pay | Admitting: Gastroenterology

## 2016-09-23 ENCOUNTER — Encounter: Payer: BC Managed Care – PPO | Admitting: Gastroenterology

## 2016-10-29 ENCOUNTER — Encounter: Payer: BC Managed Care – PPO | Admitting: Gastroenterology

## 2016-11-21 ENCOUNTER — Encounter: Payer: BC Managed Care – PPO | Admitting: Gastroenterology

## 2017-01-13 ENCOUNTER — Other Ambulatory Visit: Payer: Self-pay | Admitting: Obstetrics & Gynecology

## 2017-01-13 DIAGNOSIS — Z139 Encounter for screening, unspecified: Secondary | ICD-10-CM

## 2017-02-04 ENCOUNTER — Ambulatory Visit
Admission: RE | Admit: 2017-02-04 | Discharge: 2017-02-04 | Disposition: A | Discharge status: Home / Self Care | Payer: BC Managed Care – PPO | Source: Ambulatory Visit | Attending: Obstetrics & Gynecology | Admitting: Obstetrics & Gynecology

## 2017-02-04 DIAGNOSIS — Z139 Encounter for screening, unspecified: Secondary | ICD-10-CM

## 2018-02-03 ENCOUNTER — Other Ambulatory Visit: Payer: Self-pay | Admitting: Endocrinology

## 2018-02-03 ENCOUNTER — Other Ambulatory Visit: Payer: Self-pay | Admitting: Obstetrics & Gynecology

## 2018-02-03 DIAGNOSIS — Z1231 Encounter for screening mammogram for malignant neoplasm of breast: Secondary | ICD-10-CM

## 2018-02-24 ENCOUNTER — Ambulatory Visit
Admission: RE | Admit: 2018-02-24 | Discharge: 2018-02-24 | Disposition: A | Discharge status: Home / Self Care | Payer: BC Managed Care – PPO | Source: Ambulatory Visit | Attending: Endocrinology | Admitting: Endocrinology

## 2018-02-24 DIAGNOSIS — Z1231 Encounter for screening mammogram for malignant neoplasm of breast: Secondary | ICD-10-CM

## 2018-03-18 ENCOUNTER — Ambulatory Visit: Payer: BC Managed Care – PPO

## 2018-12-06 ENCOUNTER — Other Ambulatory Visit: Payer: Self-pay | Admitting: Endocrinology

## 2018-12-06 DIAGNOSIS — Z1231 Encounter for screening mammogram for malignant neoplasm of breast: Secondary | ICD-10-CM

## 2019-02-28 ENCOUNTER — Ambulatory Visit: Payer: BC Managed Care – PPO

## 2019-04-15 ENCOUNTER — Ambulatory Visit
Admission: RE | Admit: 2019-04-15 | Discharge: 2019-04-15 | Disposition: A | Discharge status: Home / Self Care | Payer: BC Managed Care – PPO | Source: Ambulatory Visit | Attending: Endocrinology | Admitting: Endocrinology

## 2019-04-15 ENCOUNTER — Other Ambulatory Visit: Payer: Self-pay

## 2019-04-15 DIAGNOSIS — Z1231 Encounter for screening mammogram for malignant neoplasm of breast: Secondary | ICD-10-CM

## 2020-03-01 ENCOUNTER — Other Ambulatory Visit: Payer: Self-pay | Admitting: Endocrinology

## 2020-03-01 DIAGNOSIS — Z1231 Encounter for screening mammogram for malignant neoplasm of breast: Secondary | ICD-10-CM

## 2020-04-18 ENCOUNTER — Ambulatory Visit: Payer: BC Managed Care – PPO

## 2020-04-23 ENCOUNTER — Ambulatory Visit
Admission: RE | Admit: 2020-04-23 | Discharge: 2020-04-23 | Disposition: A | Discharge status: Home / Self Care | Payer: Self-pay | Source: Ambulatory Visit | Attending: Endocrinology | Admitting: Endocrinology

## 2020-04-23 ENCOUNTER — Other Ambulatory Visit: Payer: Self-pay

## 2020-04-23 DIAGNOSIS — Z1231 Encounter for screening mammogram for malignant neoplasm of breast: Secondary | ICD-10-CM

## 2020-08-01 ENCOUNTER — Other Ambulatory Visit: Payer: Self-pay | Admitting: Surgery

## 2020-08-01 ENCOUNTER — Other Ambulatory Visit (HOSPITAL_COMMUNITY): Payer: Self-pay | Admitting: Surgery

## 2020-08-16 ENCOUNTER — Ambulatory Visit (HOSPITAL_COMMUNITY)
Admission: RE | Admit: 2020-08-16 | Discharge: 2020-08-16 | Disposition: A | Discharge status: Home / Self Care | Payer: BC Managed Care – PPO | Source: Ambulatory Visit | Attending: Surgery | Admitting: Surgery

## 2020-08-16 ENCOUNTER — Other Ambulatory Visit: Payer: Self-pay

## 2020-09-04 ENCOUNTER — Other Ambulatory Visit: Payer: Self-pay

## 2020-09-04 ENCOUNTER — Encounter: Payer: BC Managed Care – PPO | Attending: Surgery | Admitting: Skilled Nursing Facility1

## 2020-09-04 ENCOUNTER — Encounter: Payer: Self-pay | Admitting: Skilled Nursing Facility1

## 2020-09-04 DIAGNOSIS — E669 Obesity, unspecified: Secondary | ICD-10-CM | POA: Diagnosis not present

## 2020-09-04 DIAGNOSIS — E119 Type 2 diabetes mellitus without complications: Secondary | ICD-10-CM

## 2020-09-04 NOTE — Progress Notes (Addendum)
Nutrition Assessment for Bariatric Surgery Medical Nutrition Therapy Appt Start Time: 10:40    End Time: 11:40  Patient was seen on 09/04/2020 for Pre-Operative Nutrition Assessment. Letter of approval faxed to [facility] bariatric surgery program coordinator on 09/04/2020.   Referral stated Supervised Weight Loss (SWL) visits needed: 0  Pt completed visits.   Pt has cleared nutrition requirements.   Planned surgery: sleeve gastrectomy  Pt expectation of surgery: to lose weight Pt expectation of dietitian: none identified     NUTRITION ASSESSMENT   Anthropometrics  Start weight at [facility]: 262 lbs (date: 09/04/2020)  Height: 62 in BMI: 47.92 kg/m2     Clinical  Medical hx: DM, arthritis  Medications: invokamet  Labs: not updated in EMR Notable signs/symptoms: knee pain resulting in unsteady gate   Any previous deficiencies? No  Micronutrient Nutrition Focused Physical Exam: Hair: No issues observed Eyes: No issues observed Mouth: No issues observed Neck: No issues observed Nails: No issues observed Skin: No issues observed  Lifestyle & Dietary Hx  Pt states she thinks her A1C is about 7. Something.  Pt states she eats past fullness about 2 times a week.   24-Hr Dietary Recall First Meal: cereal Snack: nuts Second Meal: sandwich + chips Snack: chips Third Meal: chicken + baked potato + green beans Snack: crackers Beverages: half and half tea, coffee  + stevia + half and half creamer, tea + stevia, water, diet soda   Estimated Energy Needs Calories: 1600   NUTRITION DIAGNOSIS  Overweight/obesity (-3.3) related to past poor dietary habits and physical inactivity as evidenced by patient w/ planned sleeve gastrectomy surgery following dietary guidelines for continued weight loss.    NUTRITION INTERVENTION  Nutrition counseling (C-1) and education (E-2) to facilitate bariatric surgery goals.  Educated pt on micronutrient deficiencies post surgery  and strategies to mitigate that risk   Pre-Op Goals Reviewed with the Patient Track food and beverage intake (pen and paper, MyFitness Pal, Baritastic app, etc.) Make healthy food choices while monitoring portion sizes Consume 3 meals per day or try to eat every 3-5 hours Avoid concentrated sugars and fried foods Keep sugar & fat in the single digits per serving on food labels Practice CHEWING your food (aim for applesauce consistency) Practice not drinking 15 minutes before, during, and 30 minutes after each meal and snack Avoid all carbonated beverages (ex: soda, sparkling beverages)  Limit caffeinated beverages (ex: coffee, tea, energy drinks) Avoid all sugar-sweetened beverages (ex: regular soda, sports drinks)  Avoid alcohol  Aim for 64-100 ounces of FLUID daily (with at least half of fluid intake being plain water)  Aim for at least 60-80 grams of PROTEIN daily Look for a liquid protein source that contains ?15 g protein and ?5 g carbohydrate (ex: shakes, drinks, shots) Make a list of non-food related activities Physical activity is an important part of a healthy lifestyle so keep it moving! The goal is to reach 150 minutes of exercise per week, including cardiovascular and weight baring activity.  *Goals that are bolded indicate the pt would like to start working towards these  Handouts Provided Include  Bariatric Surgery handouts (Nutrition Visits, Pre-Op Goals, Protein Shakes, Vitamins & Minerals)  Learning Style & Readiness for Change Teaching method utilized: Visual & Auditory  Demonstrated degree of understanding via: Teach Back  Readiness Level: contemplative  Barriers to learning/adherence to lifestyle change: none identified     MONITORING & EVALUATION Dietary intake, weekly physical activity, body weight, and pre-op goals reached  at next nutrition visit.    Next Steps  Patient is to follow up at [facility] for Pre-Op Class >2 weeks before surgery for further nutrition  education.   Pt has completed visits. No further supervised visits required/recomended

## 2020-09-24 ENCOUNTER — Other Ambulatory Visit: Payer: Self-pay

## 2020-09-24 ENCOUNTER — Encounter: Payer: BC Managed Care – PPO | Attending: Surgery | Admitting: Skilled Nursing Facility1

## 2020-09-24 DIAGNOSIS — E669 Obesity, unspecified: Secondary | ICD-10-CM | POA: Diagnosis not present

## 2020-09-24 NOTE — Progress Notes (Signed)
Pre-Operative Nutrition Class:    Patient was seen on 09/24/2020 for Pre-Operative Bariatric Surgery Education [facility]    Surgery date:  Surgery type: sleeve Start weight at [facility]: 262 Weight today: 268.9 pounds  Samples given per MNT protocol. Patient educated on appropriate usage: Protien 20 shake Lot: [id number] Exp: 07/27/21   Bariatric Advantage Calcium Lot # [id number] Exp: 02/23     procare Vitamins Calcium Lot # [id number] Exp: 01/23   Celebrate Protein Powder   Lot # [id number] Exp: 04/23  The following the learning objectives were met by the patient during this course: Identify Pre-Op Dietary Goals and will begin 2 weeks pre-operatively Identify appropriate sources of fluids and proteins  State protein recommendations and appropriate sources pre and post-operatively Identify Post-Operative Dietary Goals and will follow for 2 weeks post-operatively Identify appropriate multivitamin and calcium sources Describe the need for physical activity post-operatively and will follow MD recommendations State when to call healthcare provider regarding medication questions or post-operative complications When having a diagnosis of diabetes understanding hypoglycemia symptoms and the inclusion of 1 complex carbohydrate per meal  Handouts given during class include: Pre-Op Bariatric Surgery Diet Handout Protein Shake Handout Post-Op Bariatric Surgery Nutrition Handout BEL[facility] Program Information Flyer Support Group Information Flyer [facility] Bariatric Supplements Price List  Follow-Up Plan: Patient will follow-up at [facility] 2 weeks post operatively for diet advancement per MD.

## 2020-09-27 NOTE — Progress Notes (Signed)
Sent message via epic to Dr. Hassell Done to request pre op orders.

## 2020-10-10 NOTE — Progress Notes (Signed)
Pt. Needs orders for surgery.PAT and labs on 10/11/20.

## 2020-10-10 NOTE — Patient Instructions (Addendum)
DUE TO COVID-19 ONLY ONE VISITOR IS ALLOWED TO COME WITH YOU AND STAY IN THE WAITING ROOM ONLY DURING PRE OP AND PROCEDURE DAY OF SURGERY. THE 1 VISITOR  MAY VISIT WITH YOU AFTER SURGERY IN YOUR PRIVATE ROOM DURING VISITING HOURS ONLY!  YOU NEED TO HAVE A COVID 19 TEST ON: 10/11/20 @ 1:00 PM , THIS TEST MUST BE DONE BEFORE SURGERY,  COVID TESTING SITE [place] [id number], IT IS ON THE RIGHT GOING OUT [address] APPROXIMATELY  2 MINUTES PAST [employer] ON THE RIGHT. ONCE YOUR COVID TEST IS COMPLETED,  PLEASE BEGIN THE QUARANTINE INSTRUCTIONS AS OUTLINED IN YOUR HANDOUT.               [name]   Your procedure is scheduled on: 10/15/20   Report to [facility] Main  Entrance   Report to admitting at : 11:45 AM     Call this number if you have problems the morning of surgery [phone]    Remember: Do not eat solid food :After Midnight. Clear liquids until: 10:45 AM.  CLEAR LIQUID DIET  Foods Allowed                                                                     Foods Excluded  Coffee and tea, regular and decaf                             liquids that you cannot  Plain Jell-O any favor except red or purple                                           see through such as: Fruit ices (not with fruit pulp)                                     milk, soups, orange juice  Iced Popsicles                                    All solid food Carbonated beverages, regular and diet                                    Cranberry, grape and apple juices Sports drinks like Gatorade Lightly seasoned clear broth or consume(fat free) Sugar, honey syrup  Sample Menu Breakfast                                Lunch                                     Supper Cranberry juice  Beef broth                            Chicken broth Jell-O                                     Grape juice                           Apple juice Coffee or tea                         Jell-O                                      Popsicle                                                Coffee or tea                        Coffee or tea  _____________________________________________________________________  BRUSH YOUR TEETH MORNING OF SURGERY AND RINSE YOUR MOUTH OUT, NO CHEWING GUM CANDY OR MINTS.   Take these medicines the morning of surgery with A SIP OF WATER: duloxetine,montelukast,cetirizine.Esomeprazole as needed.  How to Manage Your Diabetes Before and After Surgery  Why is it important to control my blood sugar before and after surgery? Improving blood sugar levels before and after surgery helps healing and can limit problems. A way of improving blood sugar control is eating a healthy diet by:  Eating less sugar and carbohydrates  Increasing activity/exercise  Talking with your doctor about reaching your blood sugar goals High blood sugars (greater than 180 mg/dL) can raise your risk of infections and slow your recovery, so you will need to focus on controlling your diabetes during the weeks before surgery. Make sure that the doctor who takes care of your diabetes knows about your planned surgery including the date and location.  How do I manage my blood sugar before surgery? Check your blood sugar at least 4 times a day, starting 2 days before surgery, to make sure that the level is not too high or low. Check your blood sugar the morning of your surgery when you wake up and every 2 hours until you get to the Short Stay unit. If your blood sugar is less than 70 mg/dL, you will need to treat for low blood sugar: Do not take insulin. Treat a low blood sugar (less than 70 mg/dL) with  cup of clear juice (cranberry or apple), 4 glucose tablets, OR glucose gel. Recheck blood sugar in 15 minutes after treatment (to make sure it is greater than 70 mg/dL). If your blood sugar is not greater than 70 mg/dL on recheck, call [phone] for further  instructions. Report your blood sugar to the short stay nurse when you get to Short Stay.  If you are admitted to the hospital after surgery: Your blood sugar will be checked by the staff and you will probably be given insulin after surgery (instead of oral diabetes medicines) to make sure you have good blood sugar  levels. The goal for blood sugar control after surgery is 80-180 mg/dL.   WHAT DO I DO ABOUT MY DIABETES MEDICATION?  Do not take oral diabetes medicines (pills) the morning of surgery.  THE DAY BEFORE SURGERY, take Metformin and Januvia as usual.       THE MORNING OF SURGERY,DO NOT TAKE ANY DIABETIC MEDICATIONS DAY OF YOUR SURGERY                               You may not have any metal on your body including hair pins and              piercings  Do not wear jewelry, make-up, lotions, powders or perfumes, deodorant             Do not wear nail polish on your fingernails.  Do not shave  48 hours prior to surgery.    Do not bring valuables to the hospital. [place].  Contacts, dentures or bridgework may not be worn into surgery.  Leave suitcase in the car. After surgery it may be brought to your room.     Patients discharged the day of surgery will not be allowed to drive home. IF YOU ARE HAVING SURGERY AND GOING HOME THE SAME DAY, YOU MUST HAVE AN ADULT TO DRIVE YOU HOME AND BE WITH YOU FOR 24 HOURS. YOU MAY GO HOME BY TAXI OR UBER OR ORTHERWISE, BUT AN ADULT MUST ACCOMPANY YOU HOME AND STAY WITH YOU FOR 24 HOURS.  Name and phone number of your driver:  Special Instructions: N/A              Please read over the following fact sheets you were given: _____________________________________________________________________           [facility] - Preparing for Surgery Before surgery, you can play an important role.  Because skin is not sterile, your skin needs to be as free of germs as possible.  You can reduce the number of  germs on your skin by washing with CHG (chlorahexidine gluconate) soap before surgery.  CHG is an antiseptic cleaner which kills germs and bonds with the skin to continue killing germs even after washing. Please DO NOT use if you have an allergy to CHG or antibacterial soaps.  If your skin becomes reddened/irritated stop using the CHG and inform your nurse when you arrive at Short Stay. Do not shave (including legs and underarms) for at least 48 hours prior to the first CHG shower.  You may shave your face/neck. Please follow these instructions carefully:  1.  Shower with CHG Soap the night before surgery and the  morning of Surgery.  2.  If you choose to wash your hair, wash your hair first as usual with your  normal  shampoo.  3.  After you shampoo, rinse your hair and body thoroughly to remove the  shampoo.                           4.  Use CHG as you would any other liquid soap.  You can apply chg directly  to the skin and wash                       Gently with a scrungie  or clean washcloth.  5.  Apply the CHG Soap to your body ONLY FROM THE NECK DOWN.   Do not use on face/ open                           Wound or open sores. Avoid contact with eyes, ears mouth and genitals (private parts).                       Wash face,  Genitals (private parts) with your normal soap.             6.  Wash thoroughly, paying special attention to the area where your surgery  will be performed.  7.  Thoroughly rinse your body with warm water from the neck down.  8.  DO NOT shower/wash with your normal soap after using and rinsing off  the CHG Soap.                9.  Pat yourself dry with a clean towel.            10.  Wear clean pajamas.            11.  Place clean sheets on your bed the night of your first shower and do not  sleep with pets. Day of Surgery : Do not apply any lotions/deodorants the morning of surgery.  Please wear clean clothes to the hospital/surgery center.  FAILURE TO FOLLOW THESE  INSTRUCTIONS MAY RESULT IN THE CANCELLATION OF YOUR SURGERY PATIENT SIGNATURE_________________________________  NURSE SIGNATURE__________________________________  ________________________________________________________________________

## 2020-10-11 ENCOUNTER — Encounter (HOSPITAL_COMMUNITY): Payer: Self-pay

## 2020-10-11 ENCOUNTER — Other Ambulatory Visit (HOSPITAL_COMMUNITY)
Admission: RE | Admit: 2020-10-11 | Discharge: 2020-10-11 | Disposition: A | Discharge status: Home / Self Care | Payer: BC Managed Care – PPO | Source: Ambulatory Visit | Attending: Surgery | Admitting: Surgery

## 2020-10-11 ENCOUNTER — Encounter (HOSPITAL_COMMUNITY)
Admission: RE | Admit: 2020-10-11 | Discharge: 2020-10-11 | Disposition: A | Discharge status: Home / Self Care | Payer: BC Managed Care – PPO | Source: Ambulatory Visit | Attending: Surgery | Admitting: Surgery

## 2020-10-11 ENCOUNTER — Other Ambulatory Visit: Payer: Self-pay

## 2020-10-11 DIAGNOSIS — Z01812 Encounter for preprocedural laboratory examination: Secondary | ICD-10-CM | POA: Insufficient documentation

## 2020-10-11 DIAGNOSIS — Z01818 Encounter for other preprocedural examination: Secondary | ICD-10-CM | POA: Diagnosis present

## 2020-10-11 DIAGNOSIS — Z20822 Contact with and (suspected) exposure to covid-19: Secondary | ICD-10-CM | POA: Diagnosis not present

## 2020-10-11 HISTORY — DX: Nausea with vomiting, unspecified: R11.2

## 2020-10-11 HISTORY — DX: Other complications of anesthesia, initial encounter: T88.59XA

## 2020-10-11 HISTORY — DX: Other specified postprocedural states: Z98.890

## 2020-10-11 LAB — CBC
HCT: 48.2 % — ABNORMAL HIGH (ref 36.0–46.0)
Hemoglobin: 15.3 g/dL — ABNORMAL HIGH (ref 12.0–15.0)
MCH: 27 pg (ref 26.0–34.0)
MCHC: 31.7 g/dL (ref 30.0–36.0)
MCV: 85.2 fL (ref 80.0–100.0)
Platelets: 311 10*3/uL (ref 150–400)
RBC: 5.66 MIL/uL — ABNORMAL HIGH (ref 3.87–5.11)
RDW: 14.2 % (ref 11.5–15.5)
WBC: 11.9 10*3/uL — ABNORMAL HIGH (ref 4.0–10.5)
nRBC: 0 % (ref 0.0–0.2)

## 2020-10-11 LAB — BASIC METABOLIC PANEL
Anion gap: 14 (ref 5–15)
BUN: 26 mg/dL — ABNORMAL HIGH (ref 6–20)
CO2: 23 mmol/L (ref 22–32)
Calcium: 10 mg/dL (ref 8.9–10.3)
Chloride: 101 mmol/L (ref 98–111)
Creatinine, Ser: 0.65 mg/dL (ref 0.44–1.00)
GFR, Estimated: >60 mL/min (ref >60)
Glucose, Bld: 102 mg/dL — ABNORMAL HIGH (ref 70–99)
Potassium: 4.5 mmol/L (ref 3.5–5.1)
Sodium: 138 mmol/L (ref 135–145)

## 2020-10-11 LAB — SARS CORONAVIRUS 2 (TAT 6-24 HRS): SARS Coronavirus 2: NEGATIVE

## 2020-10-11 LAB — GLUCOSE, CAPILLARY: Glucose-Capillary: 111 mg/dL — ABNORMAL HIGH (ref 70–99)

## 2020-10-11 NOTE — Progress Notes (Signed)
COVID Vaccine Completed: Yes Date COVID Vaccine completed: 12/2019. [place] COVID vaccine manufacturer: Pfizer     Dr. Reynold Bowen PCP -  Cardiologist -   Chest x-ray -  EKG - 08/16/20 Stress Test -  ECHO -  Cardiac Cath -  Pacemaker/ICD device last checked:  Sleep Study -  CPAP -   Fasting Blood Sugar - 100's Checks Blood Sugar ___2__ times a week.  Blood Thinner Instructions: Aspirin Instructions: Last Dose:  Anesthesia review:   Patient denies shortness of breath, fever, cough and chest pain at PAT appointment   Patient verbalized understanding of instructions that were given to them at the PAT appointment. Patient was also instructed that they will need to review over the PAT instructions again at home before surgery.

## 2020-10-12 LAB — HEMOGLOBIN A1C
Hgb A1c MFr Bld: 7.5 % — ABNORMAL HIGH (ref 4.8–5.6)
Mean Plasma Glucose: 169 mg/dL

## 2020-10-14 NOTE — H&P (Signed)
CC obesity  HPI [name] was seen in May 2022 for bariatric surgery evaluation. She had been to one of our online seminars. I had operated on her brother and sister-in-law most recently converting her sister-in-law from my a sleeve to a bypass she is obviously very familiar with bariatric surgery options and process specified sleeve gastrectomy. Since she received steroid injections in her knees and takes Celebrex we did not think it would be a good thing for her to have a gastric bypass. She is tried multiple diets over her lifetime with limited success. She has no history of DVT.  Following her visit she went to the requisite preop evaluation including an upper GI which showed a small sliding-type hiatal hernia with mild gastroesophageal reflux. No other abnormalities were noted. She has had a previous laparoscopic cholecystectomy. She is on for a robotic sleeve gastrectomy on August 1 in the afternoon.  Review of Systems: See HPI as well for other ROS.  Review of Systems  All other systems reviewed and are negative.   Medical History: Past Medical History:  Diagnosis Date   Diabetes mellitus without complication (CMS-HCC)   There is no problem list on file for this patient.  Past Surgical History:  Procedure Laterality Date   CHOLECYSTECTOMY 2014    Allergies  Allergen Reactions   Penicillins Hives   Current Outpatient Medications on File Prior to Visit  Medication Sig Dispense Refill   celecoxib (CELEBREX) 200 MG capsule Take 1 capsule by mouth once daily   cetirizine-pseudoephedrine (ZYRTEC-D) 5-120 mg tablet Take 1 tablet by mouth 2 (two) times daily   DULoxetine (CYMBALTA) 60 MG DR capsule Take 1 capsule by mouth once daily   empagliflozin-metformin (SYNJARDY) 12.5-1,000 mg tablet Take 1 tablet by mouth 2 (two) times daily   montelukast (SINGULAIR) 10 mg tablet   SITagliptin (JANUVIA) 100 MG tablet Take 1 tablet by mouth once daily   trospium (SANCTURA XR) 60 mg XR  capsule Take by mouth once daily   No current facility-administered medications on file prior to visit.   Family History  Problem Relation Age of Onset   High blood pressure (Hypertension) Mother   Hyperlipidemia (Elevated cholesterol) Mother   Diabetes Mother   Stroke Father   Coronary Artery Disease (Blocked arteries around heart) Father   Diabetes Father   High blood pressure (Hypertension) Brother    Social History   Tobacco Use  Smoking Status Never Smoker  Smokeless Tobacco Never Used    Social History   Socioeconomic History   Marital status: Single  Tobacco Use   Smoking status: Never Smoker   Smokeless tobacco: Never Used  Scientific laboratory technician Use: Never used  Substance and Sexual Activity   Alcohol use: Not Currently   Drug use: Never   Objective:   Vitals:  10/04/20 1154  BP: 118/72  Pulse: 85  Weight: (!) 116.9 kg (257 lb 12.8 oz)  Height: 156.2 cm (5' 1.5")   Body mass index is 47.92 kg/m.  Physical Exam General: Obese white female no acute distress HEENT: Unremarkable Chest: Clear Heart: Sinus rhythm without murmurs Breast: Not examined Abdomen: Lap chole incisions otherwise unremarkable except for obesity GU not examined Rectal not examined Extremities range of motion in the lower extremity limited by knees for which she really received in steroid injections. Neuro neuro alert and oriented x3 motor and sensory function are grossly intact  Labs, Imaging and Diagnostic Testing: None except upper GI series which I alluded to  above  Assessment and Plan:  Diagnoses and all orders for this visit:  Morbid (severe) obesity due to excess calories (CMS-HCC)    Diabetes managed by Dr. Reynold Bowen including Celesta Gentile and [place]. Januvia is 12.5 once a day and Synjardy is 10 units twice a day. She also takes for Celebrex and trospium and duloxetine and montelukast and Zyrtec.  Robotic sleeve on August 1  No follow-ups on file.  Orchid Glassberg  Donia Pounds, MD

## 2020-10-15 ENCOUNTER — Inpatient Hospital Stay (HOSPITAL_COMMUNITY)
Admission: RE | Admit: 2020-10-15 | Discharge: 2020-10-16 | DRG: 621 | Disposition: A | Discharge status: Home / Self Care | Payer: BC Managed Care – PPO | Attending: Surgery | Admitting: Surgery

## 2020-10-15 ENCOUNTER — Inpatient Hospital Stay (HOSPITAL_COMMUNITY): Payer: BC Managed Care – PPO | Admitting: Certified Registered"

## 2020-10-15 ENCOUNTER — Encounter (HOSPITAL_COMMUNITY)
Admission: RE | Disposition: A | Discharge status: Home / Self Care | Payer: Self-pay | Source: Home / Self Care | Attending: Surgery

## 2020-10-15 ENCOUNTER — Other Ambulatory Visit: Payer: Self-pay

## 2020-10-15 ENCOUNTER — Encounter (HOSPITAL_COMMUNITY): Payer: Self-pay | Admitting: Surgery

## 2020-10-15 DIAGNOSIS — K449 Diaphragmatic hernia without obstruction or gangrene: Secondary | ICD-10-CM | POA: Diagnosis present

## 2020-10-15 DIAGNOSIS — E119 Type 2 diabetes mellitus without complications: Secondary | ICD-10-CM | POA: Diagnosis present

## 2020-10-15 DIAGNOSIS — Z903 Acquired absence of stomach [part of]: Secondary | ICD-10-CM

## 2020-10-15 DIAGNOSIS — Z6841 Body Mass Index (BMI) 40.0 and over, adult: Secondary | ICD-10-CM | POA: Diagnosis not present

## 2020-10-15 DIAGNOSIS — M1990 Unspecified osteoarthritis, unspecified site: Secondary | ICD-10-CM | POA: Diagnosis present

## 2020-10-15 DIAGNOSIS — Z79899 Other long term (current) drug therapy: Secondary | ICD-10-CM

## 2020-10-15 DIAGNOSIS — Z833 Family history of diabetes mellitus: Secondary | ICD-10-CM

## 2020-10-15 DIAGNOSIS — Z880 Allergy status to penicillin: Secondary | ICD-10-CM

## 2020-10-15 DIAGNOSIS — Z7984 Long term (current) use of oral hypoglycemic drugs: Secondary | ICD-10-CM

## 2020-10-15 HISTORY — PX: UPPER GI ENDOSCOPY: SHX6162

## 2020-10-15 HISTORY — PX: HIATAL HERNIA REPAIR: SHX195

## 2020-10-15 LAB — ABO/RH: ABO/RH(D): O POS

## 2020-10-15 LAB — CBC
HCT: 47.2 % — ABNORMAL HIGH (ref 36.0–46.0)
Hemoglobin: 14.8 g/dL (ref 12.0–15.0)
MCH: 27.6 pg (ref 26.0–34.0)
MCHC: 31.4 g/dL (ref 30.0–36.0)
MCV: 87.9 fL (ref 80.0–100.0)
Platelets: 267 10*3/uL (ref 150–400)
RBC: 5.37 MIL/uL — ABNORMAL HIGH (ref 3.87–5.11)
RDW: 14.4 % (ref 11.5–15.5)
WBC: 16.3 10*3/uL — ABNORMAL HIGH (ref 4.0–10.5)
nRBC: 0 % (ref 0.0–0.2)

## 2020-10-15 LAB — CREATININE, SERUM
Creatinine, Ser: 0.65 mg/dL (ref 0.44–1.00)
GFR, Estimated: >60 mL/min (ref >60)

## 2020-10-15 LAB — TYPE AND SCREEN
ABO/RH(D): O POS
Antibody Screen: NEGATIVE

## 2020-10-15 LAB — HEMOGLOBIN AND HEMATOCRIT, BLOOD
HCT: 47.2 % — ABNORMAL HIGH (ref 36.0–46.0)
Hemoglobin: 14.7 g/dL (ref 12.0–15.0)

## 2020-10-15 LAB — GLUCOSE, CAPILLARY
Glucose-Capillary: 122 mg/dL — ABNORMAL HIGH (ref 70–99)
Glucose-Capillary: 168 mg/dL — ABNORMAL HIGH (ref 70–99)
Glucose-Capillary: 169 mg/dL — ABNORMAL HIGH (ref 70–99)
Glucose-Capillary: 180 mg/dL — ABNORMAL HIGH (ref 70–99)

## 2020-10-15 SURGERY — GASTRECTOMY, SLEEVE, ROBOT-ASSISTED
Anesthesia: General | Site: Mouth

## 2020-10-15 MED ORDER — ORAL CARE MOUTH RINSE: 15.0000 mL | Freq: Once | OROMUCOSAL | Status: AC

## 2020-10-15 MED ORDER — PANTOPRAZOLE SODIUM 40 MG IV SOLR
40.0000 mg | Freq: Every day | INTRAVENOUS | Status: DC
  Administered 2020-10-15: 40 mg via INTRAVENOUS
  Filled 2020-10-15: qty 40

## 2020-10-15 MED ORDER — ONDANSETRON HCL 4 MG/2ML IJ SOLN
INTRAMUSCULAR | Status: AC
  Filled 2020-10-15: qty 2

## 2020-10-15 MED ORDER — PROPOFOL 500 MG/50ML IV EMUL
INTRAVENOUS | Status: DC | PRN
  Administered 2020-10-15: 150 ug/kg/min via INTRAVENOUS

## 2020-10-15 MED ORDER — MEPERIDINE HCL 50 MG/ML IJ SOLN: 6.2500 mg | INTRAMUSCULAR | Status: DC | PRN

## 2020-10-15 MED ORDER — PROMETHAZINE HCL 25 MG/ML IJ SOLN: 6.2500 mg | INTRAMUSCULAR | Status: DC | PRN

## 2020-10-15 MED ORDER — DEXAMETHASONE SODIUM PHOSPHATE 10 MG/ML IJ SOLN
INTRAMUSCULAR | Status: DC | PRN
  Administered 2020-10-15: 10 mg via INTRAVENOUS

## 2020-10-15 MED ORDER — MONTELUKAST SODIUM 10 MG PO TABS
10.0000 mg | ORAL_TABLET | Freq: Every day | ORAL | Status: DC
  Administered 2020-10-16: 10 mg via ORAL
  Filled 2020-10-15: qty 1

## 2020-10-15 MED ORDER — ROCURONIUM BROMIDE 10 MG/ML (PF) SYRINGE
PREFILLED_SYRINGE | INTRAVENOUS | Status: AC
  Filled 2020-10-15: qty 10

## 2020-10-15 MED ORDER — CHLORHEXIDINE GLUCONATE 0.12 % MT SOLN
15.0000 mL | Freq: Once | OROMUCOSAL | Status: AC
  Administered 2020-10-15: 15 mL via OROMUCOSAL

## 2020-10-15 MED ORDER — HEPARIN SODIUM (PORCINE) 5000 UNIT/ML IJ SOLN
5000.0000 [IU] | Freq: Three times a day (TID) | INTRAMUSCULAR | Status: DC
  Administered 2020-10-15 – 2020-10-16 (×3): 5000 [IU] via SUBCUTANEOUS
  Filled 2020-10-15 (×3): qty 1

## 2020-10-15 MED ORDER — SODIUM CHLORIDE 0.9 % IV SOLN
2.0000 g | INTRAVENOUS | Status: AC
  Administered 2020-10-15: 2 g via INTRAVENOUS
  Filled 2020-10-15: qty 2

## 2020-10-15 MED ORDER — SODIUM CHLORIDE (PF) 0.9 % IJ SOLN
INTRAMUSCULAR | Status: DC | PRN
  Administered 2020-10-15: 10 mL via INTRAVENOUS

## 2020-10-15 MED ORDER — FENTANYL CITRATE (PF) 250 MCG/5ML IJ SOLN
INTRAMUSCULAR | Status: AC
  Filled 2020-10-15: qty 5

## 2020-10-15 MED ORDER — SCOPOLAMINE 1 MG/3DAYS TD PT72
1.0000 | MEDICATED_PATCH | TRANSDERMAL | Status: DC
  Administered 2020-10-15: 1.5 mg via TRANSDERMAL
  Filled 2020-10-15: qty 1

## 2020-10-15 MED ORDER — PROPOFOL 10 MG/ML IV BOLUS
INTRAVENOUS | Status: AC
  Filled 2020-10-15: qty 20

## 2020-10-15 MED ORDER — PROPOFOL 10 MG/ML IV BOLUS
INTRAVENOUS | Status: DC | PRN
  Administered 2020-10-15: 200 mg via INTRAVENOUS

## 2020-10-15 MED ORDER — DEXAMETHASONE SODIUM PHOSPHATE 10 MG/ML IJ SOLN
INTRAMUSCULAR | Status: AC
  Filled 2020-10-15: qty 1

## 2020-10-15 MED ORDER — HEPARIN SODIUM (PORCINE) 5000 UNIT/ML IJ SOLN
5000.0000 [IU] | INTRAMUSCULAR | Status: AC
  Administered 2020-10-15: 5000 [IU] via SUBCUTANEOUS
  Filled 2020-10-15: qty 1

## 2020-10-15 MED ORDER — 0.9 % SODIUM CHLORIDE (POUR BTL) OPTIME
TOPICAL | Status: DC | PRN
  Administered 2020-10-15: 1000 mL

## 2020-10-15 MED ORDER — DULOXETINE HCL 60 MG PO CPEP
60.0000 mg | ORAL_CAPSULE | ORAL | Status: DC
  Administered 2020-10-16: 60 mg via ORAL
  Filled 2020-10-15: qty 1

## 2020-10-15 MED ORDER — MORPHINE SULFATE (PF) 2 MG/ML IV SOLN: 1.0000 mg | INTRAVENOUS | Status: DC | PRN

## 2020-10-15 MED ORDER — ONDANSETRON HCL 4 MG/2ML IJ SOLN: 4.0000 mg | INTRAMUSCULAR | Status: DC | PRN

## 2020-10-15 MED ORDER — ACETAMINOPHEN 160 MG/5ML PO SOLN: 1000.0000 mg | Freq: Three times a day (TID) | ORAL | Status: DC

## 2020-10-15 MED ORDER — PHENYLEPHRINE 40 MCG/ML (10ML) SYRINGE FOR IV PUSH (FOR BLOOD PRESSURE SUPPORT)
PREFILLED_SYRINGE | INTRAVENOUS | Status: DC | PRN
  Administered 2020-10-15: 120 ug via INTRAVENOUS

## 2020-10-15 MED ORDER — INSULIN ASPART 100 UNIT/ML IJ SOLN
0.0000 [IU] | INTRAMUSCULAR | Status: DC
Start: 2020-10-15 — End: 2020-10-16
  Administered 2020-10-15 – 2020-10-16 (×2): 4 [IU] via SUBCUTANEOUS

## 2020-10-15 MED ORDER — CHLORHEXIDINE GLUCONATE CLOTH 2 % EX PADS: 6.0000 | MEDICATED_PAD | Freq: Once | CUTANEOUS | Status: DC

## 2020-10-15 MED ORDER — ROCURONIUM BROMIDE 10 MG/ML (PF) SYRINGE
PREFILLED_SYRINGE | INTRAVENOUS | Status: DC | PRN
Start: 2020-10-15 — End: 2020-10-15
  Administered 2020-10-15: 70 mg via INTRAVENOUS
  Administered 2020-10-15: 30 mg via INTRAVENOUS

## 2020-10-15 MED ORDER — METOPROLOL TARTRATE 5 MG/5ML IV SOLN: 5.0000 mg | Freq: Four times a day (QID) | INTRAVENOUS | Status: DC | PRN

## 2020-10-15 MED ORDER — LACTATED RINGERS IV SOLN: INTRAVENOUS | Status: DC

## 2020-10-15 MED ORDER — PROMETHAZINE HCL 25 MG/ML IJ SOLN
INTRAMUSCULAR | Status: AC
  Administered 2020-10-15: 6.25 mg via INTRAVENOUS
  Filled 2020-10-15: qty 1

## 2020-10-15 MED ORDER — KETAMINE HCL 10 MG/ML IJ SOLN
INTRAMUSCULAR | Status: DC | PRN
  Administered 2020-10-15: 20 mg via INTRAVENOUS
  Administered 2020-10-15 (×2): 10 mg via INTRAVENOUS

## 2020-10-15 MED ORDER — HYDROMORPHONE HCL 1 MG/ML IJ SOLN
INTRAMUSCULAR | Status: AC
  Administered 2020-10-15: 0.25 mg via INTRAVENOUS
  Filled 2020-10-15: qty 1

## 2020-10-15 MED ORDER — ACETAMINOPHEN 500 MG PO TABS
1000.0000 mg | ORAL_TABLET | ORAL | Status: AC
  Administered 2020-10-15: 1000 mg via ORAL
  Filled 2020-10-15: qty 2

## 2020-10-15 MED ORDER — KCL IN DEXTROSE-NACL 20-5-0.45 MEQ/L-%-% IV SOLN
INTRAVENOUS | Status: DC
  Filled 2020-10-15 (×2): qty 1000

## 2020-10-15 MED ORDER — MIDAZOLAM HCL 2 MG/2ML IJ SOLN
INTRAMUSCULAR | Status: AC
  Filled 2020-10-15: qty 2

## 2020-10-15 MED ORDER — ONDANSETRON HCL 4 MG/2ML IJ SOLN
INTRAMUSCULAR | Status: DC | PRN
  Administered 2020-10-15: 4 mg via INTRAVENOUS

## 2020-10-15 MED ORDER — ACETAMINOPHEN 500 MG PO TABS
1000.0000 mg | ORAL_TABLET | Freq: Three times a day (TID) | ORAL | Status: DC
  Administered 2020-10-15 – 2020-10-16 (×3): 1000 mg via ORAL
  Filled 2020-10-15 (×3): qty 2

## 2020-10-15 MED ORDER — BUPIVACAINE LIPOSOME 1.3 % IJ SUSP
INTRAMUSCULAR | Status: DC | PRN
  Administered 2020-10-15: 20 mL

## 2020-10-15 MED ORDER — LIDOCAINE 2% (20 MG/ML) 5 ML SYRINGE
INTRAMUSCULAR | Status: DC | PRN
Start: 2020-10-15 — End: 2020-10-15
  Administered 2020-10-15: 80 mg via INTRAVENOUS

## 2020-10-15 MED ORDER — LACTATED RINGERS IR SOLN
Status: DC | PRN
  Administered 2020-10-15: 1000 mL

## 2020-10-15 MED ORDER — KETAMINE HCL 10 MG/ML IJ SOLN
INTRAMUSCULAR | Status: AC
  Filled 2020-10-15: qty 1

## 2020-10-15 MED ORDER — OXYCODONE HCL 5 MG/5ML PO SOLN
5.0000 mg | Freq: Four times a day (QID) | ORAL | Status: DC | PRN
Start: 2020-10-15 — End: 2020-10-16

## 2020-10-15 MED ORDER — APREPITANT 40 MG PO CAPS
40.0000 mg | ORAL_CAPSULE | ORAL | Status: AC
  Administered 2020-10-15: 40 mg via ORAL
  Filled 2020-10-15: qty 1

## 2020-10-15 MED ORDER — PHENYLEPHRINE HCL (PRESSORS) 10 MG/ML IV SOLN
INTRAVENOUS | Status: AC
  Filled 2020-10-15: qty 1

## 2020-10-15 MED ORDER — HYDROMORPHONE HCL 1 MG/ML IJ SOLN
0.2500 mg | INTRAMUSCULAR | Status: DC | PRN
  Administered 2020-10-15: 0.25 mg via INTRAVENOUS

## 2020-10-15 MED ORDER — MIDAZOLAM HCL 5 MG/5ML IJ SOLN
INTRAMUSCULAR | Status: DC | PRN
  Administered 2020-10-15: 2 mg via INTRAVENOUS

## 2020-10-15 MED ORDER — GABAPENTIN 300 MG PO CAPS
300.0000 mg | ORAL_CAPSULE | ORAL | Status: AC
  Administered 2020-10-15: 300 mg via ORAL
  Filled 2020-10-15: qty 1

## 2020-10-15 MED ORDER — SUGAMMADEX SODIUM 200 MG/2ML IV SOLN
INTRAVENOUS | Status: DC | PRN
  Administered 2020-10-15: 200 mg via INTRAVENOUS

## 2020-10-15 MED ORDER — ENSURE MAX PROTEIN PO LIQD
2.0000 [oz_av] | ORAL | Status: DC
  Administered 2020-10-16: 2 [oz_av] via ORAL

## 2020-10-15 MED ORDER — FENTANYL CITRATE (PF) 250 MCG/5ML IJ SOLN
INTRAMUSCULAR | Status: DC | PRN
  Administered 2020-10-15 (×2): 25 ug via INTRAVENOUS

## 2020-10-15 MED ORDER — BUPIVACAINE LIPOSOME 1.3 % IJ SUSP
20.0000 mL | Freq: Once | INTRAMUSCULAR | Status: DC
  Filled 2020-10-15: qty 20

## 2020-10-15 MED ORDER — PHENYLEPHRINE HCL-NACL 10-0.9 MG/250ML-% IV SOLN
INTRAVENOUS | Status: DC | PRN
  Administered 2020-10-15: 20 ug/min via INTRAVENOUS

## 2020-10-15 MED ORDER — LIDOCAINE 2% (20 MG/ML) 5 ML SYRINGE
INTRAMUSCULAR | Status: AC
  Filled 2020-10-15: qty 5

## 2020-10-15 SURGICAL SUPPLY — 64 items
ADH SKN CLS APL DERMABOND .7 (GAUZE/BANDAGES/DRESSINGS) ×1
APL PRP STRL LF DISP 70% ISPRP (MISCELLANEOUS) ×1
APPLIER CLIP 5 13 M/L LIGAMAX5 (MISCELLANEOUS)
APPLIER CLIP ROT 10 11.4 M/L (STAPLE)
APR CLP MED LRG 11.4X10 (STAPLE)
BLADE SURG 15 STRL LF DISP TIS (BLADE) ×2 IMPLANT
BLADE SURG 15 STRL SS (BLADE) ×3
CANNULA REDUC XI 12-8 STAPL (CANNULA) ×3
CANNULA REDUCER 12-8 DVNC XI (CANNULA) ×2 IMPLANT
CHLORAPREP W/TINT 26 (MISCELLANEOUS) ×3 IMPLANT
CLIP APPLIE 5 13 M/L LIGAMAX5 (MISCELLANEOUS) IMPLANT
CLIP APPLIE ROT 10 11.4 M/L (STAPLE) IMPLANT
COVER SURGICAL LIGHT HANDLE (MISCELLANEOUS) ×3 IMPLANT
DECANTER SPIKE VIAL GLASS SM (MISCELLANEOUS) ×3 IMPLANT
DERMABOND ADVANCED (GAUZE/BANDAGES/DRESSINGS) ×1
DERMABOND ADVANCED .7 DNX12 (GAUZE/BANDAGES/DRESSINGS) ×2 IMPLANT
DRAPE ARM DVNC X/XI (DISPOSABLE) ×8 IMPLANT
DRAPE COLUMN DVNC XI (DISPOSABLE) ×2 IMPLANT
DRAPE DA VINCI XI ARM (DISPOSABLE) ×12
DRAPE DA VINCI XI COLUMN (DISPOSABLE) ×3
ELECT REM PT RETURN 15FT ADLT (MISCELLANEOUS) ×3 IMPLANT
GLOVE SURG ENC MOIS LTX SZ8 (GLOVE) ×6 IMPLANT
GOWN STRL REUS W/TWL XL LVL3 (GOWN DISPOSABLE) ×9 IMPLANT
GRASPER SUT TROCAR 14GX15 (MISCELLANEOUS) ×3 IMPLANT
IRRIG SUCT STRYKERFLOW 2 WTIP (MISCELLANEOUS) ×3
IRRIGATION SUCT STRKRFLW 2 WTP (MISCELLANEOUS) ×2 IMPLANT
KIT BASIN OR (CUSTOM PROCEDURE TRAY) ×3 IMPLANT
LUBRICANT JELLY K Y 4OZ (MISCELLANEOUS) IMPLANT
MARKER SKIN DUAL TIP RULER LAB (MISCELLANEOUS) ×3 IMPLANT
NEEDLE SPNL 22GX3.5 QUINCKE BK (NEEDLE) ×3 IMPLANT
OBTURATOR OPTICAL STANDARD 8MM (TROCAR) ×3
OBTURATOR OPTICAL STND 8 DVNC (TROCAR) ×2
OBTURATOR OPTICALSTD 8 DVNC (TROCAR) ×2 IMPLANT
PACK CARDIOVASCULAR III (CUSTOM PROCEDURE TRAY) ×3 IMPLANT
PAD POSITIONING PINK XL (MISCELLANEOUS) ×3 IMPLANT
RELOAD STAPLER 2.5X60 WHT DVNC (STAPLE) ×10 IMPLANT
RELOAD STAPLER 3.5X60 BLU DVNC (STAPLE) ×2 IMPLANT
SCISSORS LAP 5X35 DISP (ENDOMECHANICALS) IMPLANT
SEAL CANN UNIV 5-8 DVNC XI (MISCELLANEOUS) ×6 IMPLANT
SEAL XI 5MM-8MM UNIVERSAL (MISCELLANEOUS) ×9
SEALER VESSEL DA VINCI XI (MISCELLANEOUS) ×3
SEALER VESSEL EXT DVNC XI (MISCELLANEOUS) ×2 IMPLANT
SLEEVE GASTRECTOMY 36FR VISIGI (MISCELLANEOUS) ×3 IMPLANT
SLEEVE GASTRECTOMY 40FR VISIGI (MISCELLANEOUS) IMPLANT
SOL ANTI FOG 6CC (MISCELLANEOUS) ×2 IMPLANT
SOLUTION ANTI FOG 6CC (MISCELLANEOUS) ×1
SOLUTION ELECTROLUBE (MISCELLANEOUS) ×3 IMPLANT
STAPLER 60 DA VINCI SURE FORM (STAPLE) ×3
STAPLER 60 SUREFORM DVNC (STAPLE) ×2 IMPLANT
STAPLER CANNULA SEAL DVNC XI (STAPLE) ×2 IMPLANT
STAPLER CANNULA SEAL XI (STAPLE) ×3
STAPLER RELOAD 2.5X60 WHITE (STAPLE) ×15
STAPLER RELOAD 2.5X60 WHT DVNC (STAPLE) ×10
STAPLER RELOAD 3.5X60 BLU DVNC (STAPLE) ×2
STAPLER RELOAD 3.5X60 BLUE (STAPLE) ×3
SUT MNCRL AB 4-0 PS2 18 (SUTURE) ×6 IMPLANT
SYR 10ML ECCENTRIC (SYRINGE) IMPLANT
SYR 20ML LL LF (SYRINGE) ×3 IMPLANT
TOWEL OR 17X26 10 PK STRL BLUE (TOWEL DISPOSABLE) ×3 IMPLANT
TRAY FOLEY MTR SLVR 16FR STAT (SET/KITS/TRAYS/PACK) IMPLANT
TROCAR ADV FIXATION 5X100MM (TROCAR) IMPLANT
TROCAR BLADELESS OPT 5 100 (ENDOMECHANICALS) ×3 IMPLANT
TUBE CALIBRATION LAPBAND (TUBING) ×3 IMPLANT
TUBING INSUFFLATION 10FT LAP (TUBING) ×3 IMPLANT

## 2020-10-15 NOTE — Op Note (Signed)
   Surgeon: Kaylyn Lim, MD, FACS  Asst:  Romana Juniper, MD, FACS October 15, 2020 Anes:  General endotracheal  Procedure: Robotic repair of hiatal hernia with  sleeve gastrectomy and upper endoscopy  Diagnosis: Morbid obesity  Complications: None noted  EBL:   minimal cc  Description of Procedure:  The patient was take to OR 2 and given general anesthesia.  The abdomen was prepped with Chloroprep and draped sterilely.  A timeout was performed.  Access to the abdomen was achieved with a 5 mm Optiview through the left upper quadrant.  Following insufflation, the state of the abdomen was found to be free of adhesions but much more centripedal obesity.  First the calibration tube was inserted and the hiatus tested.  This showed a sliding hernia.  The posterior esophagus was dissected revealing the right and left crura and a singel 0 surgidek was placed and tied.  The calibration tube was passed again and the closure was deem satisfactory.  The ViSiGi 36Fr tube was inserted to deflate the stomach and was pulled back into the esophagus.  Four trocars were placed including a 12 mm for the robotic stapler.    The pylorus was identified and we measured 5 cm back and marked the antrum.  At that point we began dissection to take down the greater curvature of the stomach using the vessel sealer.  This dissection was taken all the way up to the left crus.  Posterior attachments of the stomach were also taken down.    The ViSiGi tube was then passed into the antrum and suction applied so that it was snug along the lessor curvature.  The "crow's foot" or incisura was identified.  The sleeve gastrectomy was begun using the Sureform platform stapler beginning with a blue load and completed with multiple white load firings.  .  When the sleeve was complete the tube was taken off suction and insufflated briefly.  The tube was withdrawn.  Upper endoscopy was then performed by Dr. Hassell Done .  This revealed a  cylindrical sleeve and no bleeding or bubbles.     The specimen was extracted through the 15 trocar site.  Local block was provided by infiltrating trocar sites and then closed 4-0 Monocryl and Dermabond.    Matt B. Hassell Done, [place], [facility], [place]

## 2020-10-15 NOTE — Transfer of Care (Signed)
Immediate Anesthesia Transfer of Care Note  Patient: [name]  Procedure(s) Performed: XI ROBOT ASSISTED GASTRIC SLEEVE RESECTION (Abdomen) UPPER GI ENDOSCOPY (Mouth) HERNIA REPAIR HIATAL (Abdomen)  Patient Location: PACU  Anesthesia Type:General  Level of Consciousness: drowsy  Airway & Oxygen Therapy: Patient Spontanous Breathing and Patient connected to face mask oxygen  Post-op Assessment: Report given to RN and Post -op Vital signs reviewed and stable  Post vital signs: Reviewed and stable  Last Vitals:  Vitals Value Taken Time  BP 148/76 10/15/20 1622  Temp    Pulse 74 10/15/20 1626  Resp 18 10/15/20 1626  SpO2 100 % 10/15/20 1626  Vitals shown include unvalidated device data.  Last Pain:  Vitals:   10/15/20 1201  TempSrc:   PainSc: 0-No pain      Patients Stated Pain Goal: 5 ([id number])  Complications: No notable events documented.

## 2020-10-15 NOTE — Progress Notes (Signed)
PHARMACY CONSULT FOR:  Risk Assessment for Post-Discharge VTE Following Bariatric Surgery  Post-Discharge VTE Risk Assessment: This patient's probability of 30-day post-discharge VTE is increased due to the factors marked:   Female    Age >/=60 years    BMI >/=50 kg/m2    CHF    Dyspnea at Rest    Paraplegia   x Non-gastric-band surgery    Operation Time >/=3 hr    Return to OR     Length of Stay >/= 3 d   Hx of VTE   Hypercoagulable condition   Significant venous stasis       Predicted probability of 30-day post-discharge VTE: 0.16 %  Other patient-specific factors to consider:   Recommendation for Discharge: No pharmacologic prophylaxis post-discharge    [name] is a 60 y.o. female who underwent robotic repair of hiatal hernia with gastric sleeve resection and upper endoscopy on 10/15/2020.   Case start: 1414 Case end: 1611   Allergies  Allergen Reactions   Penicillins Hives    Patient Measurements: Height: '5\' 2"'$  (157.5 cm) Weight: 114.2 kg (251 lb 12.8 oz) IBW/kg (Calculated) : 50.1 Body mass index is 46.05 kg/m.  No results for input(s): WBC, HGB, HCT, PLT, APTT, CREATININE, LABCREA, CREATININE, CREAT24HRUR, MG, PHOS, ALBUMIN, PROT, ALBUMIN, AST, ALT, ALKPHOS, BILITOT, BILIDIR, IBILI in the last 72 hours. Estimated Creatinine Clearance: 90.5 mL/min (by C-G formula based on SCr of 0.65 mg/dL).    Past Medical History:  Diagnosis Date   Arthritis [id number]   Complication of anesthesia    Diabetes mellitus 04/17/2008   Gallstones 05/31/2011   Numbness in both hands parts of both hands   all the time since cervical fusion   Numbness in feet    numbness parts of both feet since cervical fusion   Obesity    PONV (postoperative nausea and vomiting)      Medications Prior to Admission  Medication Sig Dispense Refill Last Dose   acidophilus (RISAQUAD) CAPS capsule Take 1 capsule by mouth at bedtime.   10/14/2020   AZO-CRANBERRY PO Take 1  tablet by mouth daily.   10/14/2020   cetirizine (ZYRTEC) 10 MG tablet Take 10 mg by mouth daily.   10/15/2020 at 0930   diclofenac Sodium (VOLTAREN) 1 % GEL Apply 2 g topically daily as needed (knee pain).   10/14/2020   docusate sodium (COLACE) 100 MG capsule Take 300 mg by mouth at bedtime.   10/14/2020   DULoxetine (CYMBALTA) 60 MG capsule Take 60 mg by mouth 4 (four) times a week.   10/15/2020 at 0930   Empagliflozin-metFORMIN HCl 12.07-998 MG TABS Take 1 tablet by mouth 2 (two) times daily.   10/14/2020   JANUVIA 100 MG tablet Take 100 mg by mouth daily.   10/14/2020   metroNIDAZOLE (METROGEL) 0.75 % gel Apply 1 application topically daily as needed (toe fungus).   10/14/2020   montelukast (SINGULAIR) 10 MG tablet Take 10 mg by mouth daily.   10/15/2020 at 0930   Trospium Chloride 60 MG CP24 Take 60 mg by mouth daily.   10/14/2020   celecoxib (CELEBREX) 200 MG capsule Take 200 mg by mouth daily as needed. For arthritis pain (Patient not taking: No sig reported)   10/07/2020   esomeprazole (NEXIUM) 40 MG capsule Take 1 capsule (40 mg total) by mouth 2 (two) times daily before a meal. (Patient taking differently: Take 40 mg by mouth daily as needed (acid reflux).) 180 capsule 3 More than a month  Monee Dembeck P. Legrand Como, PharmD, [place] Please utilize Amion for appropriate phone number to reach the unit pharmacist ([place]) 10/15/2020 6:41 PM

## 2020-10-15 NOTE — Anesthesia Preprocedure Evaluation (Signed)
Anesthesia Evaluation    Reviewed: Allergy & Precautions, H&P , Patient's Chart, lab work & pertinent test results, Unable to perform ROS - Chart review only  History of Anesthesia Complications (+) PONV and history of anesthetic complications  Airway Mallampati: III  TM Distance: >3 FB Neck ROM: full    Dental no notable dental hx. (+) Teeth Intact, Dental Advisory Given   Pulmonary neg pulmonary ROS,    Pulmonary exam normal breath sounds clear to auscultation       Cardiovascular Exercise Tolerance: Good negative cardio ROS   Rhythm:regular Rate:Normal     Neuro/Psych negative neurological ROS  negative psych ROS   GI/Hepatic negative GI ROS, Neg liver ROS,   Endo/Other  diabetes, Well Controlled, Type 2, Oral Hypoglycemic AgentsMorbid obesity  Renal/GU negative Renal ROS     Musculoskeletal  (+) Arthritis ,   Abdominal (+) + obese,   Peds  Hematology negative hematology ROS (+)   Anesthesia Other Findings   Reproductive/Obstetrics negative OB ROS                            Anesthesia Physical  Anesthesia Plan  ASA: 3  Anesthesia Plan: General   Post-op Pain Management:    Induction: Intravenous  PONV Risk Score and Plan: 4 or greater and Ondansetron, Dexamethasone, Treatment may vary due to age or medical condition, Midazolam, Scopolamine patch - Pre-op, TIVA, Propofol infusion, Amisulpride and Aprepitant  Airway Management Planned: Oral ETT  Additional Equipment: None  Intra-op Plan:   Post-operative Plan: Extubation in OR  Informed Consent: I have reviewed the patients History and Physical, chart, labs and discussed the procedure including the risks, benefits and alternatives for the proposed anesthesia with the patient or authorized representative who has indicated his/her understanding and acceptance.     Dental advisory given  Plan Discussed with:  CRNA  Anesthesia Plan Comments:        Anesthesia Quick Evaluation

## 2020-10-15 NOTE — Discharge Instructions (Signed)

## 2020-10-15 NOTE — Progress Notes (Signed)
Patient ambulated to the bathroom and voided. On return to chair she was educated on her water intake, first cup of 2 oz of water was given, patient used her incentive spirometer and patient verbalized understanding

## 2020-10-15 NOTE — Interval H&P Note (Signed)
History and Physical Interval Note:  10/15/2020 1:16 PM  [name]  has presented today for surgery, with the diagnosis of MORBID OBESITY.  The various methods of treatment have been discussed with the patient and family. After consideration of risks, benefits and other options for treatment, the patient has consented to  Procedure(s): XI ROBOT ASSISTED GASTRIC SLEEVE RESECTION (N/A) UPPER GI ENDOSCOPY (N/A) HERNIA REPAIR HIATAL (N/A) as a surgical intervention.  The patient's history has been reviewed, patient examined, no change in status, stable for surgery.  I have reviewed the patient's chart and labs.  Questions were answered to the patient's satisfaction.     [name]

## 2020-10-15 NOTE — Anesthesia Postprocedure Evaluation (Signed)
Anesthesia Post Note  Patient: [name]  Procedure(s) Performed: XI ROBOT ASSISTED GASTRIC SLEEVE RESECTION (Abdomen) UPPER GI ENDOSCOPY (Mouth) HERNIA REPAIR HIATAL (Abdomen)     Patient location during evaluation: PACU Anesthesia Type: General Level of consciousness: sedated and patient cooperative Pain management: pain level controlled Vital Signs Assessment: post-procedure vital signs reviewed and stable Respiratory status: spontaneous breathing Cardiovascular status: stable Anesthetic complications: no   No notable events documented.  Last Vitals:  Vitals:   10/15/20 1815 10/15/20 1907  BP: (!) 145/76 138/74  Pulse: 79 80  Resp: 16 18  Temp: (!) 36.4 C 36.7 C  SpO2: 99% 100%    Last Pain:  Vitals:   10/15/20 1907  TempSrc: Oral  PainSc:                  Nolon Nations

## 2020-10-15 NOTE — Anesthesia Procedure Notes (Signed)
Procedure Name: Intubation Date/Time: 10/15/2020 1:52 PM Performed by: Myna Bright, CRNA Pre-anesthesia Checklist: Patient identified, Emergency Drugs available, Suction available and Patient being monitored Patient Re-evaluated:Patient Re-evaluated prior to induction Oxygen Delivery Method: Circle system utilized Preoxygenation: Pre-oxygenation with 100% oxygen Induction Type: IV induction Ventilation: Mask ventilation without difficulty Laryngoscope Size: Mac and 4 Grade View: Grade I Tube type: Oral Tube size: 7.0 mm Number of attempts: 1 Airway Equipment and Method: Stylet Placement Confirmation: ETT inserted through vocal cords under direct vision, positive ETCO2 and breath sounds checked- equal and bilateral Secured at: 21 cm Tube secured with: Tape Dental Injury: Teeth and Oropharynx as per pre-operative assessment

## 2020-10-16 ENCOUNTER — Encounter (HOSPITAL_COMMUNITY): Payer: Self-pay | Admitting: Surgery

## 2020-10-16 LAB — CBC WITH DIFFERENTIAL/PLATELET
Abs Immature Granulocytes: 0.08 10*3/uL — ABNORMAL HIGH (ref 0.00–0.07)
Basophils Absolute: 0 10*3/uL (ref 0.0–0.1)
Basophils Relative: 0 %
Eosinophils Absolute: 0 10*3/uL (ref 0.0–0.5)
Eosinophils Relative: 0 %
HCT: 45.2 % (ref 36.0–46.0)
Hemoglobin: 14.2 g/dL (ref 12.0–15.0)
Immature Granulocytes: 1 %
Lymphocytes Relative: 11 %
Lymphs Abs: 1.5 10*3/uL (ref 0.7–4.0)
MCH: 27.2 pg (ref 26.0–34.0)
MCHC: 31.4 g/dL (ref 30.0–36.0)
MCV: 86.6 fL (ref 80.0–100.0)
Monocytes Absolute: 0.7 10*3/uL (ref 0.1–1.0)
Monocytes Relative: 5 %
Neutro Abs: 11.6 10*3/uL — ABNORMAL HIGH (ref 1.7–7.7)
Neutrophils Relative %: 83 %
Platelets: 269 10*3/uL (ref 150–400)
RBC: 5.22 MIL/uL — ABNORMAL HIGH (ref 3.87–5.11)
RDW: 14.5 % (ref 11.5–15.5)
WBC: 13.8 10*3/uL — ABNORMAL HIGH (ref 4.0–10.5)
nRBC: 0 % (ref 0.0–0.2)

## 2020-10-16 LAB — GLUCOSE, CAPILLARY
Glucose-Capillary: 115 mg/dL — ABNORMAL HIGH (ref 70–99)
Glucose-Capillary: 119 mg/dL — ABNORMAL HIGH (ref 70–99)
Glucose-Capillary: 157 mg/dL — ABNORMAL HIGH (ref 70–99)
Glucose-Capillary: 92 mg/dL (ref 70–99)

## 2020-10-16 MED ORDER — PANTOPRAZOLE SODIUM 40 MG PO TBEC: 40.0000 mg | DELAYED_RELEASE_TABLET | Freq: Every day | ORAL | 0 refills | Status: DC

## 2020-10-16 MED ORDER — OXYCODONE HCL 5 MG PO TABS: 5.0000 mg | ORAL_TABLET | Freq: Four times a day (QID) | ORAL | 0 refills | Status: DC | PRN

## 2020-10-16 MED ORDER — ONDANSETRON 4 MG PO TBDP: 4.0000 mg | ORAL_TABLET | Freq: Four times a day (QID) | ORAL | 0 refills | Status: DC | PRN

## 2020-10-16 NOTE — Progress Notes (Signed)
Patient alert and oriented, pain is controlled. Patient is tolerating fluids, advanced to protein shake today, patient is tolerating well.  Reviewed Gastric sleeve discharge instructions with patient and patient is able to articulate understanding.  Provided information on BELT program, Support Group and [facility] outpatient pharmacy. All questions answered, will continue to monitor.  

## 2020-10-16 NOTE — Progress Notes (Signed)
Nutrition Education Note ° °Received consult for diet education for patient s/p bariatric surgery. ° °Discussed 2 week post op diet with pt. Emphasized that liquids must be non carbonated, non caffeinated, and sugar free. Fluid goals discussed. Pt to follow up with outpatient bariatric RD for further diet progression after 2 weeks. Multivitamins and minerals also reviewed. Teach back method used, pt expressed understanding, expect good compliance. ° °If nutrition issues arise, please consult RD. ° °Sydney Kleinman, MS, RD, LDN °Inpatient Clinical Dietitian °Contact information available via Amion ° ° °

## 2020-10-16 NOTE — Progress Notes (Signed)
Patient alert and oriented, Post op day 1.  Provided support and encouragement.  Encouraged pulmonary toilet, ambulation and small sips of liquids.  All questions answered.  Will continue to monitor. 

## 2020-10-16 NOTE — Progress Notes (Signed)
Pt completed bottle of protein.

## 2020-10-16 NOTE — Progress Notes (Signed)
24hr fluid recall prior to discharge: 660mL. Per dehydration protocol, will call pt to f/u within one week post op. ?

## 2020-10-16 NOTE — Discharge Summary (Signed)
Physician Discharge Summary  Patient ID: [name] MRN: [id number] DOB/AGE: 1960/09/16 60 y.o.  PCP: Reynold Bowen, MD  Admit date: 10/15/2020 Discharge date: 10/16/2020  Admission Diagnoses:  obesity  Discharge Diagnoses:  same  Principal Problem:   Status post sleeve gastrectomy   Surgery:  robotic sleeve gastrectomy and hiatal hernia repair  Discharged Condition: improved  Hospital Course:   had surgery on Monday.  Was begun on liquids and ready for discharge when taking full liquids.  Consults: none  Significant Diagnostic Studies: none    Discharge Exam: Blood pressure 124/62, pulse 70, temperature (!) 97.4 F (36.3 C), temperature source Oral, resp. rate 18, height '5\' 2"'$  (1.575 m), weight 114.2 kg, SpO2 100 %. Incisions OK  Disposition: Discharge disposition: 01-Home or Self Care       Discharge Instructions     Ambulate hourly while awake   Complete by: As directed    Call MD for:  difficulty breathing, headache or visual disturbances   Complete by: As directed    Call MD for:  persistant dizziness or light-headedness   Complete by: As directed    Call MD for:  persistant nausea and vomiting   Complete by: As directed    Call MD for:  redness, tenderness, or signs of infection (pain, swelling, redness, odor or green/yellow discharge around incision site)   Complete by: As directed    Call MD for:  severe uncontrolled pain   Complete by: As directed    Call MD for:  temperature >101 F   Complete by: As directed    Diet bariatric full liquid   Complete by: As directed    Incentive spirometry   Complete by: As directed    Perform hourly while awake      Allergies as of 10/16/2020       Reactions   Penicillins Hives        Medication List     TAKE these medications    acidophilus Caps capsule Take 1 capsule by mouth at bedtime.   AZO-CRANBERRY PO Take 1 tablet by mouth daily.   cetirizine 10 MG tablet Commonly known as:  ZYRTEC Take 10 mg by mouth daily.   diclofenac Sodium 1 % Gel Commonly known as: VOLTAREN Apply 2 g topically daily as needed (knee pain).   docusate sodium 100 MG capsule Commonly known as: COLACE Take 300 mg by mouth at bedtime.   DULoxetine 60 MG capsule Commonly known as: CYMBALTA Take 60 mg by mouth 4 (four) times a week.   Empagliflozin-metFORMIN HCl 12.07-998 MG Tabs Take 1 tablet by mouth 2 (two) times daily.   Januvia 100 MG tablet Generic drug: sitaGLIPtin Take 100 mg by mouth daily.   metroNIDAZOLE 0.75 % gel Commonly known as: METROGEL Apply 1 application topically daily as needed (toe fungus).   montelukast 10 MG tablet Commonly known as: SINGULAIR Take 10 mg by mouth daily.   ondansetron 4 MG disintegrating tablet Commonly known as: ZOFRAN-ODT Take 1 tablet (4 mg total) by mouth every 6 (six) hours as needed for nausea or vomiting.   oxyCODONE 5 MG immediate release tablet Commonly known as: Oxy IR/ROXICODONE Take 1 tablet (5 mg total) by mouth every 6 (six) hours as needed for severe pain.   pantoprazole 40 MG tablet Commonly known as: PROTONIX Take 1 tablet (40 mg total) by mouth daily.   Trospium Chloride 60 MG Cp24 Take 60 mg by mouth daily.       ASK your  doctor about these medications    celecoxib 200 MG capsule Commonly known as: CELEBREX Take 200 mg by mouth daily as needed. For arthritis pain   esomeprazole 40 MG capsule Commonly known as: NEXIUM Take 1 capsule (40 mg total) by mouth 2 (two) times daily before a meal.        Follow-up Information     Surgery, [place]. Go on 11/07/2020.   Specialty: General Surgery Why: at 4:30pm with Dr. Hassell Done.  Please arrive 15 minutes prior to your appointment time.  Thank you. Contact information: [address] [place] [address] [phone]         Carlena Hurl, PA-C. Go on 12/07/2020.   Specialty: General Surgery Why: at 9:15am for Dr. Hassell Done.  Please arrive  15 minutes prior to your appointment time.  Thank you. Contact information: [place] [address] [phone]                 Signed: [name] 10/16/2020, 10:08 AM

## 2020-10-17 LAB — SURGICAL PATHOLOGY

## 2020-10-19 ENCOUNTER — Telehealth (HOSPITAL_COMMUNITY): Payer: Self-pay | Admitting: *Deleted

## 2020-10-19 NOTE — Telephone Encounter (Signed)
1.  Tell me about your pain and pain management? Pt denies any pain.  2.  Let's talk about fluid intake.  How much total fluid are you taking in? Pt states that she is working to meet goal of 64 oz of fluid today.  Pt has consumed an average of 35f oz with water and protein shakes.  Pt encouraged to continue to increase fluid intake to meet goal of 64 fl oz.  Pt instructed to assess status and suggestions daily utilizing Hydration Action Plan on discharge folder and to call [facility] if in the "red zone".   3.  How much protein have you taken in the last 2 days? Pt states she is meeting her goal of 60g of protein each day with the protein shakes.  4.  Have you had nausea?  Tell me about when have experienced nausea and what you did to help? Pt denies nausea.   5.  Has the frequency or color changed with your urine? Pt states that she is urinating "fine" with no changes in frequency or urgency.     6.  Tell me what your incisions look like? "Incisions look fine". Pt denies a fever, chills.  Pt states incisions are not swollen, open, or draining.  Pt encouraged to call [facility] if incisions change. 7.  Have you been passing gas? BM? Pt states that she has not had a BM.  Pt instructed to take either Miralax or MoM as instructed per "Gastric Bypass/Sleeve Discharge Home Care Instructions".  Pt to call surgeon's office if not able to have BM with medication.   8.  If a problem or question were to arise who would you call?  Do you know contact numbers for [facility], and [facility]? Pt denies dehydration symptoms.  Pt can describe s/sx of dehydration.  Pt knows to call [facility] for surgical, [facility] for nutrition, and [facility] questions or concerns.   9.  How has the walking going? Pt states she is walking around and able to be active without difficulty.   10. Are you still using your incentive spirometer?  If so, how often? Pt states that she does not have her I.S. Pt encouraged to TCDB hourly to simulate  using an I.S. until she sees the surgeon.  11.  How are your vitamins and calcium going?  How are you taking them? Pt states that she is taking her supplements and vitamins without difficulty. Reminded patient that the first 30 days post-operatively are important for successful recovery.  Practice good hand hygiene, wearing a mask when appropriate (since optional in most places), and minimizing exposure to people who live outside of the home, especially if they are exhibiting any respiratory, GI, or illness-like symptoms.

## 2020-10-23 ENCOUNTER — Telehealth (HOSPITAL_COMMUNITY): Payer: Self-pay | Admitting: *Deleted

## 2020-10-23 NOTE — Telephone Encounter (Signed)
Pt is working in her home today.  She's moving around frequently and in little to no pain.  Minimal nausea.  Pt has consistently drunk 2 protein shakes daily (60gm). She had 48 oz of fluid yesterday and is gradually increasing her intake.  She is having 1-2 loose BMs daily.  No complaints.

## 2020-10-30 ENCOUNTER — Other Ambulatory Visit: Payer: Self-pay

## 2020-10-30 ENCOUNTER — Encounter: Payer: BC Managed Care – PPO | Attending: Surgery | Admitting: Skilled Nursing Facility1

## 2020-10-30 DIAGNOSIS — E669 Obesity, unspecified: Secondary | ICD-10-CM | POA: Insufficient documentation

## 2020-10-30 NOTE — Progress Notes (Signed)
2 Week Post-Operative Nutrition Class   Patient was seen on 10/30/2020 for Post-Operative Nutrition education at [facility].    Pt states she is no longer taking januvia and taking synjardy half dose.    Pt states she does check her blood sugars daily which have bene about 130.   Pt states she had one blood sugar low feeling and drank Gatorade zero: dietitian educated pt on this topic  Surgery date: 10/15/2020 Surgery type: sleeve gastrectomy  Start weight at [facility]: 262 pounds Weight today: 242 pounds Bowel Habits: Every day to every other day no complaints    The following the learning objectives were met by the patient during this course: Identifies Phase 3 (Soft, High Proteins) Dietary Goals and will begin from 2 weeks post-operatively to 2 months post-operatively Identifies appropriate sources of fluids and proteins  Identifies appropriate fat sources and healthy verses unhealthy fat types   States protein recommendations and appropriate sources post-operatively Identifies the need for appropriate texture modifications, mastication, and bite sizes when consuming solids Identifies appropriate fat consumption and sources Identifies appropriate multivitamin and calcium sources post-operatively Describes the need for physical activity post-operatively and will follow MD recommendations States when to call healthcare provider regarding medication questions or post-operative complications   Handouts given during class include: Phase 3A: Soft, High Protein Diet Handout Phase 3 High Protein Meals Healthy Fats   Follow-Up Plan: Patient will follow-up at [facility] in 6 weeks for 2 month post-op nutrition visit for diet advancement per MD.  

## 2020-11-05 ENCOUNTER — Telehealth: Payer: Self-pay | Admitting: Skilled Nursing Facility1

## 2020-11-05 NOTE — Telephone Encounter (Signed)
RD called pt to verify fluid intake once starting soft, solid proteins 2 week post-bariatric surgery.   Daily Fluid intake: Daily Protein intake: Bowel Habits: diarrhea since Friday  Concerns/issues:   Pt state she has eaten beans and cheese which went well and cottage cheese did okay. Pt states she had some tuna which did not sit well. Pt states she has had diarrhea since Friday and got some labs for C-Diff.   Pt states she does not like the chewable multivitamin: dietitian advised pt to try the capsule later in the day  Dietitian advised pt to sip on water, broth, gatorade g2 throughout the day to ensure properly hydrated with the diarrhea.

## 2020-11-13 ENCOUNTER — Encounter: Payer: Self-pay | Admitting: Gastroenterology

## 2020-11-19 ENCOUNTER — Observation Stay (HOSPITAL_COMMUNITY): Payer: BC Managed Care – PPO

## 2020-11-19 ENCOUNTER — Other Ambulatory Visit: Payer: Self-pay

## 2020-11-19 ENCOUNTER — Encounter (HOSPITAL_COMMUNITY): Payer: Self-pay

## 2020-11-19 ENCOUNTER — Emergency Department (HOSPITAL_COMMUNITY): Payer: BC Managed Care – PPO

## 2020-11-19 ENCOUNTER — Observation Stay (HOSPITAL_COMMUNITY)
Admission: EM | Admit: 2020-11-19 | Discharge: 2020-11-22 | Disposition: A | Discharge status: Home / Self Care | Payer: BC Managed Care – PPO | Attending: Internal Medicine | Admitting: Internal Medicine

## 2020-11-19 DIAGNOSIS — E8729 Other acidosis: Secondary | ICD-10-CM | POA: Diagnosis present

## 2020-11-19 DIAGNOSIS — Z20822 Contact with and (suspected) exposure to covid-19: Secondary | ICD-10-CM | POA: Insufficient documentation

## 2020-11-19 DIAGNOSIS — K5900 Constipation, unspecified: Secondary | ICD-10-CM

## 2020-11-19 DIAGNOSIS — J309 Allergic rhinitis, unspecified: Secondary | ICD-10-CM | POA: Diagnosis present

## 2020-11-19 DIAGNOSIS — D72829 Elevated white blood cell count, unspecified: Secondary | ICD-10-CM | POA: Diagnosis present

## 2020-11-19 DIAGNOSIS — E872 Acidosis, unspecified: Secondary | ICD-10-CM

## 2020-11-19 DIAGNOSIS — R112 Nausea with vomiting, unspecified: Secondary | ICD-10-CM | POA: Diagnosis present

## 2020-11-19 DIAGNOSIS — Z79899 Other long term (current) drug therapy: Secondary | ICD-10-CM | POA: Diagnosis not present

## 2020-11-19 DIAGNOSIS — R109 Unspecified abdominal pain: Secondary | ICD-10-CM | POA: Insufficient documentation

## 2020-11-19 DIAGNOSIS — E860 Dehydration: Secondary | ICD-10-CM | POA: Diagnosis not present

## 2020-11-19 DIAGNOSIS — J302 Other seasonal allergic rhinitis: Secondary | ICD-10-CM | POA: Diagnosis not present

## 2020-11-19 DIAGNOSIS — E1110 Type 2 diabetes mellitus with ketoacidosis without coma: Principal | ICD-10-CM | POA: Diagnosis present

## 2020-11-19 DIAGNOSIS — K219 Gastro-esophageal reflux disease without esophagitis: Secondary | ICD-10-CM

## 2020-11-19 DIAGNOSIS — Z7984 Long term (current) use of oral hypoglycemic drugs: Secondary | ICD-10-CM | POA: Diagnosis not present

## 2020-11-19 LAB — COMPREHENSIVE METABOLIC PANEL
ALT: 20 U/L (ref 0–44)
AST: 48 U/L — ABNORMAL HIGH (ref 15–41)
Albumin: 4.3 g/dL (ref 3.5–5.0)
Alkaline Phosphatase: 129 U/L — ABNORMAL HIGH (ref 38–126)
Anion gap: 20 — ABNORMAL HIGH (ref 5–15)
BUN: 14 mg/dL (ref 6–20)
CO2: 11 mmol/L — ABNORMAL LOW (ref 22–32)
Calcium: 9.7 mg/dL (ref 8.9–10.3)
Chloride: 104 mmol/L (ref 98–111)
Creatinine, Ser: 0.9 mg/dL (ref 0.44–1.00)
GFR, Estimated: >60 mL/min (ref >60)
Glucose, Bld: 183 mg/dL — ABNORMAL HIGH (ref 70–99)
Potassium: 4.4 mmol/L (ref 3.5–5.1)
Sodium: 135 mmol/L (ref 135–145)
Total Bilirubin: 2.3 mg/dL — ABNORMAL HIGH (ref 0.3–1.2)
Total Protein: 8.6 g/dL — ABNORMAL HIGH (ref 6.5–8.1)

## 2020-11-19 LAB — URINALYSIS, MICROSCOPIC (REFLEX)

## 2020-11-19 LAB — BASIC METABOLIC PANEL
Anion gap: 17 — ABNORMAL HIGH (ref 5–15)
BUN: 11 mg/dL (ref 6–20)
CO2: 15 mmol/L — ABNORMAL LOW (ref 22–32)
Calcium: 8.7 mg/dL — ABNORMAL LOW (ref 8.9–10.3)
Chloride: 104 mmol/L (ref 98–111)
Creatinine, Ser: 0.68 mg/dL (ref 0.44–1.00)
GFR, Estimated: >60 mL/min (ref >60)
Glucose, Bld: 123 mg/dL — ABNORMAL HIGH (ref 70–99)
Potassium: 3.8 mmol/L (ref 3.5–5.1)
Sodium: 136 mmol/L (ref 135–145)

## 2020-11-19 LAB — URINALYSIS, ROUTINE W REFLEX MICROSCOPIC
Glucose, UA: >=500 mg/dL — AB
Ketones, ur: 80 mg/dL — AB
Nitrite: NEGATIVE
Protein, ur: 30 mg/dL — AB
Specific Gravity, Urine: 1.03 (ref 1.005–1.030)
pH: 6 (ref 5.0–8.0)

## 2020-11-19 LAB — CBC WITH DIFFERENTIAL/PLATELET
Abs Immature Granulocytes: 0.06 10*3/uL (ref 0.00–0.07)
Basophils Absolute: 0.1 10*3/uL (ref 0.0–0.1)
Basophils Relative: 0 %
Eosinophils Absolute: 0 10*3/uL (ref 0.0–0.5)
Eosinophils Relative: 0 %
HCT: 55.6 % — ABNORMAL HIGH (ref 36.0–46.0)
Hemoglobin: 17.9 g/dL — ABNORMAL HIGH (ref 12.0–15.0)
Immature Granulocytes: 0 %
Lymphocytes Relative: 11 %
Lymphs Abs: 1.6 10*3/uL (ref 0.7–4.0)
MCH: 27.8 pg (ref 26.0–34.0)
MCHC: 32.2 g/dL (ref 30.0–36.0)
MCV: 86.5 fL (ref 80.0–100.0)
Monocytes Absolute: 0.8 10*3/uL (ref 0.1–1.0)
Monocytes Relative: 6 %
Neutro Abs: 11.6 10*3/uL — ABNORMAL HIGH (ref 1.7–7.7)
Neutrophils Relative %: 83 %
Platelets: 336 10*3/uL (ref 150–400)
RBC: 6.43 MIL/uL — ABNORMAL HIGH (ref 3.87–5.11)
RDW: 17.4 % — ABNORMAL HIGH (ref 11.5–15.5)
WBC: 14.1 10*3/uL — ABNORMAL HIGH (ref 4.0–10.5)
nRBC: 0 % (ref 0.0–0.2)

## 2020-11-19 LAB — LIPASE, BLOOD: Lipase: 34 U/L (ref 11–51)

## 2020-11-19 LAB — BLOOD GAS, VENOUS
Acid-base deficit: 17 mmol/L — ABNORMAL HIGH (ref 0.0–2.0)
Bicarbonate: 10.6 mmol/L — ABNORMAL LOW (ref 20.0–28.0)
FIO2: 21
O2 Saturation: 65.4 %
Patient temperature: 98.6
pCO2, Ven: 29.9 mmHg — ABNORMAL LOW (ref 44.0–60.0)
pH, Ven: 7.177 — CL (ref 7.250–7.430)
pO2, Ven: 39.4 mmHg (ref 32.0–45.0)

## 2020-11-19 LAB — RESP PANEL BY RT-PCR (FLU A&B, COVID) ARPGX2
Influenza A by PCR: NEGATIVE
Influenza B by PCR: NEGATIVE
SARS Coronavirus 2 by RT PCR: NEGATIVE

## 2020-11-19 LAB — LACTIC ACID, PLASMA: Lactic Acid, Venous: 1.8 mmol/L (ref 0.5–1.9)

## 2020-11-19 LAB — RAPID URINE DRUG SCREEN, HOSP PERFORMED
Amphetamines: NOT DETECTED
Barbiturates: NOT DETECTED
Benzodiazepines: NOT DETECTED
Cocaine: NOT DETECTED
Opiates: NOT DETECTED
Tetrahydrocannabinol: NOT DETECTED

## 2020-11-19 LAB — SALICYLATE LEVEL: Salicylate Lvl: <7 mg/dL — ABNORMAL LOW (ref 7.0–30.0)

## 2020-11-19 LAB — GLUCOSE, CAPILLARY
Glucose-Capillary: 132 mg/dL — ABNORMAL HIGH (ref 70–99)
Glucose-Capillary: 92 mg/dL (ref 70–99)

## 2020-11-19 LAB — MAGNESIUM: Magnesium: 2.4 mg/dL (ref 1.7–2.4)

## 2020-11-19 LAB — CBG MONITORING, ED
Glucose-Capillary: 139 mg/dL — ABNORMAL HIGH (ref 70–99)
Glucose-Capillary: 136 mg/dL — ABNORMAL HIGH (ref 70–99)

## 2020-11-19 LAB — PROTIME-INR
INR: 1.1 (ref 0.8–1.2)
Prothrombin Time: 14 seconds (ref 11.4–15.2)

## 2020-11-19 LAB — BETA-HYDROXYBUTYRIC ACID: Beta-Hydroxybutyric Acid: >8 mmol/L — ABNORMAL HIGH (ref 0.05–0.27)

## 2020-11-19 LAB — CK: Total CK: 99 U/L (ref 38–234)

## 2020-11-19 MED ORDER — DEXTROSE IN LACTATED RINGERS 5 % IV SOLN: INTRAVENOUS | Status: DC

## 2020-11-19 MED ORDER — NALOXONE HCL 0.4 MG/ML IJ SOLN: 0.4000 mg | INTRAMUSCULAR | Status: DC | PRN

## 2020-11-19 MED ORDER — FENTANYL CITRATE PF 50 MCG/ML IJ SOSY
25.0000 ug | PREFILLED_SYRINGE | INTRAMUSCULAR | Status: DC | PRN
Start: 2020-11-19 — End: 2020-11-21

## 2020-11-19 MED ORDER — LACTATED RINGERS IV BOLUS
1000.0000 mL | Freq: Once | INTRAVENOUS | Status: AC
  Administered 2020-11-19: 1000 mL via INTRAVENOUS

## 2020-11-19 MED ORDER — DEXTROSE IN LACTATED RINGERS 5 % IV SOLN: INTRAVENOUS | Status: AC

## 2020-11-19 MED ORDER — ONDANSETRON HCL 4 MG/2ML IJ SOLN
4.0000 mg | Freq: Once | INTRAMUSCULAR | Status: AC
  Administered 2020-11-19: 4 mg via INTRAVENOUS
  Filled 2020-11-19: qty 2

## 2020-11-19 MED ORDER — INSULIN REGULAR(HUMAN) IN NACL 100-0.9 UT/100ML-% IV SOLN
INTRAVENOUS | Status: DC
  Administered 2020-11-19: 0.7 [IU]/h via INTRAVENOUS
  Filled 2020-11-19: qty 100

## 2020-11-19 MED ORDER — PANTOPRAZOLE SODIUM 40 MG IV SOLR
40.0000 mg | Freq: Every day | INTRAVENOUS | Status: DC
  Administered 2020-11-20 – 2020-11-21 (×3): 40 mg via INTRAVENOUS
  Filled 2020-11-19 (×3): qty 40

## 2020-11-19 MED ORDER — LACTATED RINGERS IV SOLN: INTRAVENOUS | Status: DC

## 2020-11-19 MED ORDER — POTASSIUM CHLORIDE 10 MEQ/100ML IV SOLN
10.0000 meq | INTRAVENOUS | Status: AC
  Administered 2020-11-19: 10 meq via INTRAVENOUS
  Filled 2020-11-19: qty 100

## 2020-11-19 MED ORDER — IOHEXOL 350 MG/ML SOLN
80.0000 mL | Freq: Once | INTRAVENOUS | Status: AC | PRN
  Administered 2020-11-19: 80 mL via INTRAVENOUS

## 2020-11-19 MED ORDER — ACETAMINOPHEN 650 MG RE SUPP: 650.0000 mg | Freq: Four times a day (QID) | RECTAL | Status: DC | PRN

## 2020-11-19 MED ORDER — ACETAMINOPHEN 325 MG PO TABS
650.0000 mg | ORAL_TABLET | Freq: Four times a day (QID) | ORAL | Status: DC | PRN
  Administered 2020-11-20 – 2020-11-22 (×2): 650 mg via ORAL
  Filled 2020-11-19 (×2): qty 2

## 2020-11-19 MED ORDER — DEXTROSE 50 % IV SOLN: 0.0000 mL | INTRAVENOUS | Status: DC | PRN

## 2020-11-19 MED ORDER — KCL IN DEXTROSE-NACL 10-5-0.45 MEQ/L-%-% IV SOLN
INTRAVENOUS | Status: DC
  Filled 2020-11-19 (×3): qty 1000

## 2020-11-19 MED ORDER — ONDANSETRON HCL 4 MG/2ML IJ SOLN
4.0000 mg | Freq: Four times a day (QID) | INTRAMUSCULAR | Status: DC | PRN
  Administered 2020-11-21: 4 mg via INTRAVENOUS
  Filled 2020-11-19: qty 2

## 2020-11-19 NOTE — ED Triage Notes (Signed)
Patient c/o nausea, chills, weakness x 3 days and worse today.

## 2020-11-19 NOTE — H&P (Signed)
History and Physical    PLEASE NOTE THAT DRAGON DI[employer] SOFTWARE WAS USED IN THE CONSTRUCTION OF THIS NOTE.   [name] [id number] DOB: February 21, 1961 DOA: 11/19/2020  PCP: Reynold Bowen, MD Patient coming from: home   I have personally briefly reviewed patient's old medical records in [place]  Chief Complaint: Nausea vomiting  HPI: [name] is a 60 y.o. female with medical history significant for type 2 diabetes mellitus, recent gastric sleeve procedure, GERD, allergic rhinitis who is admitted to [facility] on 11/19/2020 with DKA after presenting from home to [facility] ED complaining of nausea and vomiting.   The patient reports 3 to 4 days of nausea and vomiting associated with at least 3 episodes of daily nonbilious, nonbloody vomiting over that timeframe, with most recent such episode of emesis occurring just prior to presenting to the emergency department today.  As a consequence of this nausea/vomiting, she conveys significant decline in oral intake of both food and fluid over the course of the last 3 to 4 days.  Denies any associated subjective fever, chills, rigors, or generalized myalgias.  No recent dysuria, gross hematuria, or change in urinary urgency/frequency.  No recent headache, neck stiffness, sore throat, abdominal pain, diarrhea, or rash.  No recent known COVID-19 exposures.  She also denies any recent chest pain, shortness of breath, palpitations, diaphoresis, dizziness, presyncope, or syncope.  No recent trauma.  She conveys no regular or recent alcohol consumption, no history of recreational drug use.  Denies any associated acute focal neurologic deficits, including no acute focal weakness, acute focal numbness, paresthesias, facial droop, dysarthria, expressive aphasia, acute change in vision, acute change in hearing, dysphagia, vertigo.  In the setting of progression of her nausea/vomiting over the last 3 to 4 days, patient conveys that she  is no longer able to tolerate p.o. at this time.  She notes that she underwent gastric sleeve procedure with Dr. Hassell Done on 10/15/2020, and conveys that she was recovering well from this procedure, without any overt initial complications, including no significant nausea/vomiting leading up to onset of such 3 to 4 days ago.    She knowledges a history of type 2 diabetes mellitus, with chart review revealing most recent hemoglobin A1c 7.5% on 10/11/2020.  She conveys that up until 1 week ago, her diabetes was managed via semaglutide and Januvia.  However, she reports that in the setting of her improving blood sugar, at that her PCP discontinued both semaglutide and Januvia 1 week ago, in favor of initiation of Jardiance at that time.  She notes good compliance and observing these recommendations, and subsequently noted onset of the above nausea/vomiting 2 to 3 days after making the above changes, including initiation of Jardiance.  Denies use of any additional hypoglycemic agents at home, and has not on any exogenous insulin.  No prior history of DKA.    ED Course:  Vital signs in the ED were notable for the following: Temperature max 98.0, heart rate initially 102, which decreased to 90 following interval administration of IV fluids, as further detailed below; blood pressure 126/79 -147/93; respiratory rate 18-20, oxygen saturation 100% on room air.  Labs were notable for the following: VBG notable for 7.177/29.9.  CMP notable for the following: Sodium 135, potassium 4.4, bicarbonate 11, anion gap 20, creatinine 0.90, glucose 183, alkaline phosphatase 129, AST 48, ALT 20, total bilirubin 2.3.  Lipase 34.  CBC notable for white blood cell count 14,000, hemoglobin 17.9.  Lactic acid 1.8.  Beta hydroxybutyric acid greater than 8.0.  Urinalysis notable for the following: 6-10 white blood cells, rare bacteria, nitrate negative, no squamous epithelial cells, positive for hyaline casts, specific gravity 1.030, and  showed 80 ketones.  COVID-19 PCR performed in the ED today was found to be negative.  Imaging and additional notable ED work-up: CT abdomen/pelvis showed evidence of prior gastric sleeve resection, without overt radiographic complication, and demonstrated no evidence of acute intra-abdominal process.  EDP discussed the patient's case with on-call critical care physician, Dr. Silas Flood, Who felt the presentation was most consistent with euglycemic DKA as a consequence of recently initiated Jardiance, as opposed to her presenting starvation keto lactic acidosis due to recent decline in oral intake as a consequence of recent gastric sleeve. He recommended standard DKA work-up and management, including insulin drip, associated IVF's, and serial BMPs to monitor ensuing anion gap.  Additionally, case/imaging was discussed by EDP with general surgery given temporal proximity to gastric sleeve procedure.  General surgery reviewed the above imaging, and felt like she has presentation was unlikely to be as a consequence of the recent gastric sleeve procedure, and did not recommend any modifications to currently established outpatient general surgery plan at this time for postoperative management recent gastric sleeve procedure.    While in the ED, the following were administered: Insulin drip initiated; lactated Ringer's x3 L bolus followed by initiation of D5 LR at 125 cc/h, potassium chloride 20 mill equivalents IV every 2 hours x1 dose.  Subsequently patient was admitted overnight observation for further evaluation and management of suspected presenting DKA.      Review of Systems: As per HPI otherwise 10 point review of systems negative.   Past Medical History:  Diagnosis Date   Arthritis [id number]   Complication of anesthesia    Diabetes mellitus 04/17/2008   Gallstones 05/31/2011   Numbness in both hands parts of both hands   all the time since cervical fusion   Numbness in feet    numbness parts  of both feet since cervical fusion   Obesity    PONV (postoperative nausea and vomiting)     Past Surgical History:  Procedure Laterality Date    endometria lablation  02-2005   CERVICAL FUSION  04/17/08   CHOLECYSTECTOMY  07/01/2011   Procedure: LAPAROSCOPIC CHOLECYSTECTOMY WITH INTRAOPERATIVE CHOLANGIOGRAM;  Surgeon: Marcello Moores A. Cornett, MD;  Location: [facility];  Service: General;  Laterality: N/A;   HIATAL HERNIA REPAIR N/A 10/15/2020   Procedure: HERNIA REPAIR HIATAL;  Surgeon: Johnathan Hausen, MD;  Location: [facility];  Service: General;  Laterality: N/A;   KNEE SURGERY Left 2014   Torn meniscus   UPPER GI ENDOSCOPY N/A 10/15/2020   Procedure: UPPER GI ENDOSCOPY;  Surgeon: Johnathan Hausen, MD;  Location: [facility];  Service: General;  Laterality: N/A;    Social History:  reports that she has never smoked. She has never used smokeless tobacco. She reports that she does not drink alcohol and does not use drugs.   Allergies  Allergen Reactions   Penicillins Hives    Family History  Problem Relation Age of Onset   Stroke Father    Diabetes Father    Heart disease Father    Colon polyps Mother    Lung cancer Maternal Aunt    Diabetes Maternal Grandfather    Heart disease Maternal Grandfather    Liver cancer Maternal Aunt    Colon cancer Neg Hx     Family history reviewed and not pertinent  Prior to Admission medications   Medication Sig Start Date End Date Taking? Authorizing Provider  acetaminophen (TYLENOL) 500 MG tablet Take 1,000 mg by mouth every 6 (six) hours as needed (pain).   Yes [provider]  cetirizine (ZYRTEC) 10 MG tablet Take 10 mg by mouth at bedtime.   Yes [provider]  diclofenac Sodium (VOLTAREN) 1 % GEL Apply 2 g topically daily as needed (knee pain).   Yes [provider]  docusate sodium (COLACE) 100 MG capsule Take 300 mg by mouth at bedtime.   Yes [provider]  empagliflozin (JARDIANCE) 25 MG TABS tablet Take 25 mg  by mouth at bedtime.   Yes [provider]  montelukast (SINGULAIR) 10 MG tablet Take 10 mg by mouth daily as needed (allergies). 10/02/20  Yes [provider]  ondansetron (ZOFRAN-ODT) 4 MG disintegrating tablet Take 1 tablet (4 mg total) by mouth every 6 (six) hours as needed for nausea or vomiting. 10/16/20  Yes Johnathan Hausen, MD  pantoprazole (PROTONIX) 40 MG tablet Take 1 tablet (40 mg total) by mouth daily. Patient taking differently: Take 40 mg by mouth at bedtime. 10/16/20  Yes Johnathan Hausen, MD  PRESCRIPTION MEDICATION Apply 1 application topically 3 (three) times daily as needed (colon spasms). Diltiazem 2% ointment compounded by [facility]   Yes [provider]  celecoxib (CELEBREX) 200 MG capsule Take 200 mg by mouth daily as needed (arthritis pain).    [provider]  oxyCODONE (OXY IR/ROXICODONE) 5 MG immediate release tablet Take 1 tablet (5 mg total) by mouth every 6 (six) hours as needed for severe pain. Patient not taking: No sig reported 10/16/20   Johnathan Hausen, MD  Trospium Chloride 60 MG CP24 Take 60 mg by mouth daily. 09/20/20   [provider]     Objective    Physical Exam: Vitals:   11/19/20 1437 11/19/20 1445 11/19/20 1630 11/19/20 1745  BP: 126/79  (!) 147/93 (!) 144/28  Pulse: (!) 102  90 90  Resp: '18  18 20  '$ Temp: 97.7 F (36.5 C)     TempSrc: Oral     SpO2: 100%  100% 100%  Weight:  102.1 kg    Height:  '5\' 2"'$  (1.575 m)      General: appears to be stated age; alert, oriented Skin: warm, dry, no rash Head:  AT/[place] Mouth:  Oral mucosa membranes appear dry, normal dentition Neck: supple; trachea midline Heart:  RRR; did not appreciate any M/R/G Lungs: CTAB, did not appreciate any wheezes, rales, or rhonchi Abdomen: + BS; soft, ND, NT Vascular: 2+ pedal pulses b/l; 2+ radial pulses b/l Extremities: no peripheral edema, no muscle wasting Neuro: strength and sensation intact in upper and lower  extremities b/l    Labs on Admission: I have personally reviewed following labs and imaging studies  CBC: Recent Labs  Lab 11/19/20 1532  WBC 14.1*  NEUTROABS 11.6*  HGB 17.9*  HCT 55.6*  MCV 86.5  PLT 123456   Basic Metabolic Panel: Recent Labs  Lab 11/19/20 1532  NA 135  K 4.4  CL 104  CO2 11*  GLUCOSE 183*  BUN 14  CREATININE 0.90  CALCIUM 9.7   GFR: Estimated Creatinine Clearance: 75.3 mL/min (by C-G formula based on SCr of 0.9 mg/dL). Liver Function Tests: Recent Labs  Lab 11/19/20 1532  AST 48*  ALT 20  ALKPHOS 129*  BILITOT 2.3*  PROT 8.6*  ALBUMIN 4.3   Recent Labs  Lab  11/19/20 1532  LIPASE 34   No results for input(s): AMMONIA in the last 168 hours. Coagulation Profile: No results for input(s): INR, PROTIME in the last 168 hours. Cardiac Enzymes: No results for input(s): CKTOTAL, CKMB, CKMBINDEX, TROPONINI in the last 168 hours. BNP (last 3 results) No results for input(s): PROBNP in the last 8760 hours. HbA1C: No results for input(s): HGBA1C in the last 72 hours. CBG: Recent Labs  Lab 11/19/20 1748  GLUCAP 139*   Lipid Profile: No results for input(s): CHOL, HDL, LDLCALC, TRIG, CHOLHDL, LDLDIRECT in the last 72 hours. Thyroid Function Tests: No results for input(s): TSH, T4TOTAL, FREET4, T3FREE, THYROIDAB in the last 72 hours. Anemia Panel: No results for input(s): VITAMINB12, FOLATE, FERRITIN, TIBC, IRON, RETICCTPCT in the last 72 hours. Urine analysis:    Component Value Date/Time   COLORURINE YELLOW 04/27/2008 2102   APPEARANCEUR CLOUDY (A) 04/27/2008 2102   LABSPEC 1.030 04/27/2008 2102   PHURINE 5.5 04/27/2008 2102   GLUCOSEU 500 (A) 04/27/2008 2102   HGBUR TRACE (A) 04/27/2008 2102   BILIRUBINUR NEGATIVE 04/27/2008 2102   KETONESUR NEGATIVE 04/27/2008 2102   PROTEINUR NEGATIVE 04/27/2008 2102   UROBILINOGEN 0.2 04/27/2008 2102   NITRITE NEGATIVE 04/27/2008 2102   LEUKOCYTESUR SMALL (A) 04/27/2008 2102    Radiological  Exams on Admission: CT Abdomen Pelvis W Contrast  Result Date: 11/19/2020 CLINICAL DATA:  Recent gastric sleeve resection by weeks ago. 32 pounds weight loss. Feels like food is stuck in throat. EXAM: CT ABDOMEN AND PELVIS WITH CONTRAST TECHNIQUE: Multidetector CT imaging of the abdomen and pelvis was performed using the standard protocol following bolus administration of intravenous contrast. Sagittal and coronal MPR images reconstructed from axial data set. CONTRAST:  39m OMNIPAQUE IOHEXOL 350 MG/ML SOLN IV. No oral contrast. COMPARISON:  08/09/2015 FINDINGS: Lower chest: Lung bases clear Hepatobiliary: Gallbladder surgically absent. Probable focal fatty infiltration of liver adjacent to falciform fissure. Overall decreased fatty infiltration of liver since previous exam. No additional focal hepatic abnormalities or biliary dilatation. Pancreas: Normal appearance Spleen: Normal appearance Adrenals/Urinary Tract: RIGHT adrenal gland normal appearance. LEFT adrenal nodule 19 x 15 mm previously 16 x 16 mm, indeterminate attenuation and washout. Kidneys, ureters, and bladder normal appearance Stomach/Bowel: Prior gastric sleeve resection. No dilatation or wall thickening of distal esophagus. Residual stomach unremarkable. Slightly prominent stool ball in rectum. Large and small bowel loops otherwise normal appearance. Normal appendix. Vascular/Lymphatic: Atherosclerotic calcifications aorta and iliac arteries without aneurysm. Vascular structures patent. No adenopathy. Reproductive: Unremarkable Other: No free air, free fluid, or abscess. No hernia or acute inflammatory process. Musculoskeletal: Demineralized. IMPRESSION: Prior gastric sleeve resection. Slightly prominent stool ball in rectum. No acute intra-abdominal or intrapelvic abnormalities. Left adrenal nodule 19 x 15 mm previously 16 x 16 mm, of indeterminate attenuation and washout; in the absence of a history of malignancy this is a probably benign lesion  and follow-up adrenal CT with and without contrast is recommended in 12 months to assess stability and characterize. Aortic Atherosclerosis (ICD10-I70.0). Electronically Signed   By: MLavonia DanaM.D.   On: 11/19/2020 17:28      Assessment/Plan   Sydney ROttilie Hutnikis a 60y.o. female with medical history significant for type 2 diabetes mellitus, recent gastric sleeve procedure, GERD, allergic rhinitis [facility]    Principal Problem:   DKA (diabetic ketoacidosis) (HStearns Active Problems:   Nausea &  vomiting   Dehydration   Leukocytosis   GERD (gastroesophageal reflux disease)   Allergic rhinitis   High anion gap metabolic acidosis    #) Diabetic ketoacidosis: In the setting of a known history of type II diabetes with most recent A1c of 7.5% on 10/11/2020, dx of dka on the basis of presenting VBG consistent with metabolic acidosis while presenting CMP consistent with an anion gap metabolic acidosis with serum bicarbonate of 11 and anion gap of 20, and beta hydroxybutyric acid elevated at greater than 8.0.  Of note, serum potassium found to be 4.4.  In the setting of mild glycemic elevation, with presenting blood sugar 183, the patient's case was discussed with the on-call critical care physician, as further described above, who felt that presentation was most consistent with euglycemic DKA as a consequence of recently initiated Jardiance, as opposed to her presenting starvation keto lactic acidosis due to recent decline in oral intake as a consequence of recent gastric sleeve, as there is increased risk for development of DKA following postoperative initiation of Jardiance. He recommended standard DKA work-up and management, including insulin drip, associated IVF's, and serial BMPs to monitor ensuing anion gap.  Case/imaging were also discussed with the on-call general surgeon,  did not feel that the above presentation was as a consequence of any complication of recent gastric sleeve procedure, as above.  As noted above, patient not on any insulin as an outpatient, rather solely on Jardiance, which started 1 week ago, following concomitant discontinuation of Januvia and Semaglutide at that time.  Given dynamic insulin resistance scenario in the context of recent gastric sleeve procedure, will consult diabetic educator for assistance/recommendations regarding plan for oral hypoglycemic management following resolution of DKA.   While presenting euglycemic DKA is felt to be as a pharmacologic consequence of postoperative initiation of Jardiance, will also evaluate for any additional organic factors contributing to her dka, including assessing for independent factors that may be contributing to metabolic acidosis, as further detailed above.  Of note, the patient does not appear septic, no evidence of underlying infectious process at this time, including negative COVID-19 PCR as well as presenting urinalysis that was inconsistent with UTI.  Will check chest x-ray to further assess for any underlying infectious contribution. Also, ACS is felt to be less likely at this time, including no report of recent chest discomfort.  Will follow for result of aforementioned chest x-ray and also check EKG to further evaluate.  In the emergency department insulin drip was initiated, and she received LR boluses x3 L, following initiation of D5 LR as well as potassium chloride 20 mill equivalents IV over 2 hours x 1 dose.  In setting of her blood sugar less than 250 and presenting serum potassium of 4.4, will initiate D5 half-normal saline with 10 mEq/L KCl at 125 cc/h, with additional management as detailed below.   Plan: DKA protocol initiated. Insulin drip per protocol. Q1 hour Accuchecks. Q4H BMP's in order to monitor ensuing anion gap, serum sodium, serum potassium. NPO. D5 half-normal saline with 10  mEq/L KCl at 125 cc/h, as above.  Add on serum magnesium level check serum phosphorus.  Repeat serum magnesium level in the morning.  Holding home [place].  Diabetic educator consult placed for the morning, as further detailed above, for assistance with recommendations to nature and timing of transition to oral hypoglycemic agents following resolution of DKA while initially maintaining insulin drip for 1-2 additional hours to prevent rebound hyperglycemia. Monitor on telemetry. Monitor strict  I's and O's.  Prn IV Zofran. Will also obtain CXR and EKG at this time, for completeness of DKA work-up. Check UDS.  In terms of further evaluation of potential factors contributing to metabolic acidosis from a non DKA standpoint, will also check CPK, salicylate level, INR.      #) Dehydration: Physical exam and laboratory evidence to suggest presenting dehydration, with evidence of dry oral mucous membranes as well as urinalysis demonstrating evidence of hyaline casts as well as elevated specific gravity, felt to be as a consequence of recent increase in GI losses concomitant with decline in oral intake, as further detailed above.  No evidence of associated hypotension.  Initial tachycardia that improved following IV fluids also appears to be consistent with a picture of dehydration.  Plan: IV fluids, as above.  Further evaluation management suspected presenting DKA, as above.  As needed IV Zofran.  Add on serum magnesium level.  Repeat CMP and CBC in the morning.  Monitor strict I's and O's and daily weights.    #) Leukocytosis: Mildly elevated presenting with blood cell count of 14,000, which is felt to be noninfectious in nature, but rather as a result of hemoconcentration effects of presenting dehydration, as evidenced by concomitant mild elevation of hemoglobin, as well as potential reactive leukocytosis in the setting of inflammatory ramifications of presenting DKA with associated nausea/vomiting.  No  evidence of underlying factious process at this time, including urinalysis that was suggestive of UTI, while COVID-19 PCR was found to be negative today.  Plan: Further evaluation management of suspected presenting DKA and associated dehydration, including IV fluids, as further detailed above.  Check chest x-ray.  Repeat CBC with differential in the morning.      #) Allergic rhinitis: On Zyrtec as an outpatient.  Plan: In the setting of current n.p.o. status, will hold home Zyrtec for now.      #) GERD: On Protonix nightly at home.  Plan: In the setting of current n.p.o. status, will convert oral Protonix to IV administration for now.     DVT prophylaxis: SCDs Code Status: Full code Family Communication: Case was discussed with the patient's niece, who is present at bedside Disposition Plan: Per Rounding Team Consults called: Case was discussed with on-call critical care as well as on-call general surgery, as further detailed above;  Admission status: Observation     Of note, this patient was added by me to the following Admit List/Treatment Team: [email or web address].      PLEASE NOTE THAT DRAGON DICTATION SOFTWARE WAS USED IN THE CONSTRUCTION OF THIS NOTE.   [facility] Pager [phone] From [place]  Otherwise, please contact night-coverage  [email or web address] Password [facility]   11/19/2020, 6:47 PM

## 2020-11-19 NOTE — ED Notes (Signed)
Per endo tool dont start insulin drip and recheck cbg @ 1822

## 2020-11-19 NOTE — Progress Notes (Signed)
Patient transferred to ICU Room 22, report given.

## 2020-11-19 NOTE — ED Provider Notes (Signed)
[name]  CSN: [id number] Arrival date & time: 11/19/20 1429    History Chief Complaint  Patient presents with   Nausea   Chills   Weakness    [name] is a 60 y.o. female who is approx 5 weeks post gastric sleeve surgery (Dr. Hassell Done) reports she was doing well post-op, had been drinking fluids well and beginning to take solids. 3-4 days ago she began to have trouble swallowing and some dry heaves. She has also felt like she has not been able to have a bowel movement. She has been feeling weak, having chills but no fever. She denies any dysuria but reports dark urine. She has not had chest pain, cough or SOB.    Past Medical History:  Diagnosis Date   Arthritis [id number]   Complication of anesthesia    Diabetes mellitus 04/17/2008   Gallstones 05/31/2011   Numbness in both hands parts of both hands   all the time since cervical fusion   Numbness in feet    numbness parts of both feet since cervical fusion   Obesity    PONV (postoperative nausea and vomiting)     Past Surgical History:  Procedure Laterality Date    endometria lablation  02-2005   CERVICAL FUSION  04/17/08   CHOLECYSTECTOMY  07/01/2011   Procedure: LAPAROSCOPIC CHOLECYSTECTOMY WITH INTRAOPERATIVE CHOLANGIOGRAM;  Surgeon: Marcello Moores A. Cornett, MD;  Location: [facility];  Service: General;  Laterality: N/A;   HIATAL HERNIA REPAIR N/A 10/15/2020   Procedure: HERNIA REPAIR HIATAL;  Surgeon: Johnathan Hausen, MD;  Location: [facility];  Service: General;  Laterality: N/A;   KNEE SURGERY Left 2014   Torn meniscus   UPPER GI ENDOSCOPY N/A 10/15/2020   Procedure: UPPER GI ENDOSCOPY;  Surgeon: Johnathan Hausen, MD;  Location: [facility];  Service: General;  Laterality: N/A;    Family History  Problem Relation Age of Onset   Stroke Father    Diabetes Father    Heart disease Father    Colon polyps Mother    Lung cancer Maternal Aunt    Diabetes Maternal Grandfather    Heart  disease Maternal Grandfather    Liver cancer Maternal Aunt    Colon cancer Neg Hx     Social History   Tobacco Use   Smoking status: Never   Smokeless tobacco: Never  Vaping Use   Vaping Use: Never used  Substance Use Topics   Alcohol use: No   Drug use: No     Home Medications Prior to Admission medications   Medication Sig Start Date End Date Taking? Authorizing Provider  acetaminophen (TYLENOL) 500 MG tablet Take 1,000 mg by mouth every 6 (six) hours as needed (pain).   Yes [provider]  cetirizine (ZYRTEC) 10 MG tablet Take 10 mg by mouth at bedtime.   Yes [provider]  diclofenac Sodium (VOLTAREN) 1 % GEL Apply 2 g topically daily as needed (knee pain).   Yes [provider]  docusate sodium (COLACE) 100 MG capsule Take 300 mg by mouth at bedtime.   Yes [provider]  empagliflozin (JARDIANCE) 25 MG TABS tablet Take 25 mg by mouth at bedtime.   Yes [provider]  montelukast (SINGULAIR) 10 MG tablet Take 10 mg by mouth daily as needed (allergies). 10/02/20  Yes [provider]  ondansetron (ZOFRAN-ODT) 4 MG disintegrating tablet Take 1 tablet (4 mg total) by mouth every 6 (six) hours as needed for nausea  or vomiting. 10/16/20  Yes Johnathan Hausen, MD  pantoprazole (PROTONIX) 40 MG tablet Take 1 tablet (40 mg total) by mouth daily. Patient taking differently: Take 40 mg by mouth at bedtime. 10/16/20  Yes Johnathan Hausen, MD  PRESCRIPTION MEDICATION Apply 1 application topically 3 (three) times daily as needed (colon spasms). Diltiazem 2% ointment compounded by [facility]   Yes [provider]  celecoxib (CELEBREX) 200 MG capsule Take 200 mg by mouth daily as needed (arthritis pain).    [provider]  oxyCODONE (OXY IR/ROXICODONE) 5 MG immediate release tablet Take 1 tablet (5 mg total) by mouth every 6 (six) hours as needed for severe pain. Patient not taking: No sig reported 10/16/20   Johnathan Hausen, MD  Trospium Chloride 60 MG CP24 Take 60 mg by mouth daily. 09/20/20   [provider]     Allergies    Penicillins   Review of Systems   Review of Systems A comprehensive review of systems was completed and negative except as noted in HPI.    Physical Exam BP (!) 144/28   Pulse 90   Temp 97.7 F (36.5 C) (Oral)   Resp 20   Ht '5\' 2"'$  (1.575 m)   Wt 102.1 kg   SpO2 100%   BMI 41.15 kg/m   Physical Exam Vitals and nursing Bennett reviewed.  Constitutional:      Appearance: Normal appearance.  HENT:     Head: Normocephalic and atraumatic.     Nose: Nose normal.     Mouth/Throat:     Mouth: Mucous membranes are moist.  Eyes:     Extraocular Movements: Extraocular movements intact.     Conjunctiva/sclera: Conjunctivae normal.  Cardiovascular:     Rate and Rhythm: Normal rate.  Pulmonary:     Effort: Pulmonary effort is normal.     Breath sounds: Normal breath sounds.  Abdominal:     General: Abdomen is flat.     Palpations: Abdomen is soft.     Tenderness: There is abdominal tenderness (mild RUQ). There is no guarding.     Comments: decreased bowel sounds  Musculoskeletal:        General: No swelling. Normal range of motion.     Cervical back: Neck supple.  Skin:    General: Skin is warm and dry.  Neurological:     General: No focal deficit present.     Mental Status: She is alert.  Psychiatric:        Mood and Affect: Mood normal.     ED Results / Procedures / Treatments   Labs (all labs ordered are listed, but only abnormal results are displayed) Labs Reviewed  CBC WITH DIFFERENTIAL/PLATELET - Abnormal; Notable for the following components:      Result Value   WBC 14.1 (*)    RBC 6.43 (*)    Hemoglobin 17.9 (*)    HCT 55.6 (*)    RDW 17.4 (*)    Neutro Abs 11.6 (*)    All other components within normal limits  COMPREHENSIVE METABOLIC PANEL - Abnormal; Notable for the following components:   CO2 11 (*)    Glucose, Bld 183 (*)     Total Protein 8.6 (*)    AST 48 (*)    Alkaline Phosphatase 129 (*)    Total Bilirubin 2.3 (*)    Anion gap 20 (*)    All other components within normal limits  BLOOD GAS, VENOUS - Abnormal; Notable for the following  components:   pH, Ven 7.177 (*)    pCO2, Ven 29.9 (*)    Bicarbonate 10.6 (*)    Acid-base deficit 17.0 (*)    All other components within normal limits  BETA-HYDROXYBUTYRIC ACID - Abnormal; Notable for the following components:   Beta-Hydroxybutyric Acid >8.00 (*)    All other components within normal limits  CBG MONITORING, ED - Abnormal; Notable for the following components:   Glucose-Capillary 139 (*)    All other components within normal limits  RESP PANEL BY RT-PCR (FLU A&B, COVID) ARPGX2  LIPASE, BLOOD  LACTIC ACID, PLASMA  URINALYSIS, ROUTINE W REFLEX MICROSCOPIC    EKG None  Radiology CT Abdomen Pelvis W Contrast  Result Date: 11/19/2020 CLINICAL DATA:  Recent gastric sleeve resection by weeks ago. 32 pounds weight loss. Feels like food is stuck in throat. EXAM: CT ABDOMEN AND PELVIS WITH CONTRAST TECHNIQUE: Multidetector CT imaging of the abdomen and pelvis was performed using the standard protocol following bolus administration of intravenous contrast. Sagittal and coronal MPR images reconstructed from axial data set. CONTRAST:  81m OMNIPAQUE IOHEXOL 350 MG/ML SOLN IV. No oral contrast. COMPARISON:  08/09/2015 FINDINGS: Lower chest: Lung bases clear Hepatobiliary: Gallbladder surgically absent. Probable focal fatty infiltration of liver adjacent to falciform fissure. Overall decreased fatty infiltration of liver since previous exam. No additional focal hepatic abnormalities or biliary dilatation. Pancreas: Normal appearance Spleen: Normal appearance Adrenals/Urinary Tract: RIGHT adrenal gland normal appearance. LEFT adrenal nodule 19 x 15 mm previously 16 x 16 mm, indeterminate attenuation and washout. Kidneys, ureters, and bladder normal appearance  Stomach/Bowel: Prior gastric sleeve resection. No dilatation or wall thickening of distal esophagus. Residual stomach unremarkable. Slightly prominent stool ball in rectum. Large and small bowel loops otherwise normal appearance. Normal appendix. Vascular/Lymphatic: Atherosclerotic calcifications aorta and iliac arteries without aneurysm. Vascular structures patent. No adenopathy. Reproductive: Unremarkable Other: No free air, free fluid, or abscess. No hernia or acute inflammatory process. Musculoskeletal: Demineralized. IMPRESSION: Prior gastric sleeve resection. Slightly prominent stool ball in rectum. No acute intra-abdominal or intrapelvic abnormalities. Left adrenal nodule 19 x 15 mm previously 16 x 16 mm, of indeterminate attenuation and washout; in the absence of a history of malignancy this is a probably benign lesion and follow-up adrenal CT with and without contrast is recommended in 12 months to assess stability and characterize. Aortic Atherosclerosis (ICD10-I70.0). Electronically Signed   By: MLavonia DanaM.D.   On: 11/19/2020 17:28    Procedures .Critical Care  Date/Time: 11/19/2020 6:15 PM Performed by: STruddie Hidden MD Authorized by: STruddie Hidden MD   Critical care provider statement:    Critical care time (minutes):  60   Critical care time was exclusive of:  Separately billable procedures and treating other patients   Critical care was necessary to treat or prevent imminent or life-threatening deterioration of the following conditions:  Endocrine crisis and metabolic crisis   Critical care was time spent personally by me on the following activities:  Discussions with consultants, evaluation of patient's response to treatment, examination of patient, ordering and performing treatments and interventions, ordering and review of laboratory studies, ordering and review of radiographic studies, pulse oximetry, re-evaluation of patient's condition, obtaining history from patient or  surrogate and review of old charts   Care discussed with: admitting provider    Medications Ordered in the ED Medications  insulin regular, human (MYXREDLIN) 100 units/ 100 mL infusion (0 Units/hr Intravenous Hold 11/19/20 1754)  lactated ringers infusion ( Intravenous Not Given 11/19/20  1753)  dextrose 5 % in lactated ringers infusion (has no administration in time range)  dextrose 50 % solution 0-50 mL (has no administration in time range)  potassium chloride 10 mEq in 100 mL IVPB (10 mEq Intravenous New Bag/Given 11/19/20 1813)  ondansetron (ZOFRAN) injection 4 mg (4 mg Intravenous Given 11/19/20 1536)  lactated ringers bolus 1,000 mL (0 mLs Intravenous Stopped 11/19/20 1748)  iohexol (OMNIPAQUE) 350 MG/ML injection 80 mL (80 mLs Intravenous Contrast Given 11/19/20 1640)  lactated ringers bolus 1,000 mL (1,000 mLs Intravenous New Bag/Given 11/19/20 1730)  lactated ringers bolus 1,000 mL (1,000 mLs Intravenous New Bag/Given 11/19/20 1813)     MDM Rules/Calculators/A&P MDM Patient with recent bariatric surgery, here with decreased bowel movements, nausea and vomiting. Some trouble swallowing and feeling weak. Will check labs and send for CT to evaluate post-op complications/SBO. Zofran and IVF for nausea.   ED Course  I have reviewed the triage vital signs and the nursing notes.  Pertinent labs & imaging results that were available during my care of the patient were reviewed by me and considered in my medical decision making (see chart for details).  Clinical Course as of 11/19/20 1815  Mon Nov 19, 2020  1600 CBC with mild leukocytosis and polycythemia, could represent hemoconcentration.  [CS]  1609 CMP with mildly elevated glucose, there is also evidence of metabolic acidosis with increased anion gap and low bicarb,suspect this is a starvation ketosis and not DKA. Will add VBG and beta-hydroxy to further quantify. Continue IVF resuscitation.  [CS]  [id number] Covid is neg.  [CS]  [id number] VBG confirms  metabolic acidosis. Will add lactic acid, give additional LR bolus.  [CS]  [id number] Beta-hydroxy is elevated. I suspect this is euglycemic DKA. Spoke with Dr. Silas Flood, ICU who agrees, recommends starting DKA order set but feels that the patient is appropriate for hospitalist admission.  CT neg for acute surgical process, will give Surgery a call to let them know she is here given recent bariatric surgery.  Lactic acid is neg.  [CS]  [id number] Discussed plan with patient, she states her PCP started her on Jardiance just a week ago. She has not had a dose today.  [CS]  [id number] Spoke with Dr. Harlow Asa, Gen Surg, who will add the patient to their consult list for tomorrow.  [CS]  [id number] Spoke with Dr. Velia Meyer, Hospitalist, who will evaluate for admission.  [CS]    Clinical Course User Index [CS] Truddie Hidden, MD    Final Clinical Impression(s) / ED Diagnoses Final diagnoses:  Diabetic ketoacidosis without coma associated with type 2 diabetes mellitus ([place])  Constipation, unspecified constipation type  Metabolic acidosis    Rx / DC Orders ED Discharge Orders     None        Truddie Hidden, MD 11/19/20 1815

## 2020-11-19 NOTE — ED Provider Notes (Signed)
Emergency Medicine Provider Triage Evaluation Note  [name] , a 60 y.o. female  was evaluated in triage.  Pt complains of abd pain for the last few days. Gastric sleeve gastrectomy last week. Now with nausea, dry heaves, constipation.  Review of Systems  Positive: Abd pain, nausea, dry heaves, constipation, chills Negative: fevers  Physical Exam  BP 126/79 (BP Location: Right Arm)   Pulse (!) 102   Temp 97.7 F (36.5 C) (Oral)   Resp 18   SpO2 100%  Gen:   Awake, no distress   Resp:  Normal effort  MSK:   Moves extremities without difficulty  Other:  Epigastric ttp  Medical Decision Making  Medically screening exam initiated at 2:40 PM.  Appropriate orders placed.  [name] was informed that the remainder of the evaluation will be completed by another provider, this initial triage assessment does not replace that evaluation, and the importance of remaining in the ED until their evaluation is complete.     Rodney Booze, [place] 11/19/20 1442    Wyvonnia Dusky, MD 11/19/20 Tresa Moore

## 2020-11-19 NOTE — Progress Notes (Signed)
Brief note regarding preliminary plan, with full H&P to follow:  60 year old female with type 2 diabetes mellitus who underwent gastric sleeve procedure on 10/15/2020 as well as recent initiation of Jardiance, who is being admitted for DKA after presenting with nausea/vomiting and diminished oral intake over the last few days.  In the setting of presenting mildly elevated blood sugar, but anion gap metabolic acidosis, the patient's case was discussed with the on-call critical care physician, Dr. Silas Flood, Who felt the presentation was most consistent with euglycemic DKA as a consequence of recently initiated Jardiance.  He recommended standard DKA work-up and management, including insulin drip and serial BMPs to monitor ensuing anion gap.  Additionally, general surgery was consulted given proximity to gastric sleeve procedure, and they did not recommend any modifications to currently established outpatient general surgery plan at this time.     Babs Bertin, DO Hospitalist

## 2020-11-20 DIAGNOSIS — E1110 Type 2 diabetes mellitus with ketoacidosis without coma: Secondary | ICD-10-CM | POA: Diagnosis not present

## 2020-11-20 LAB — GLUCOSE, CAPILLARY
Glucose-Capillary: 100 mg/dL — ABNORMAL HIGH (ref 70–99)
Glucose-Capillary: 107 mg/dL — ABNORMAL HIGH (ref 70–99)
Glucose-Capillary: 113 mg/dL — ABNORMAL HIGH (ref 70–99)
Glucose-Capillary: 115 mg/dL — ABNORMAL HIGH (ref 70–99)
Glucose-Capillary: 117 mg/dL — ABNORMAL HIGH (ref 70–99)
Glucose-Capillary: 119 mg/dL — ABNORMAL HIGH (ref 70–99)
Glucose-Capillary: 122 mg/dL — ABNORMAL HIGH (ref 70–99)
Glucose-Capillary: 125 mg/dL — ABNORMAL HIGH (ref 70–99)
Glucose-Capillary: 129 mg/dL — ABNORMAL HIGH (ref 70–99)
Glucose-Capillary: 134 mg/dL — ABNORMAL HIGH (ref 70–99)
Glucose-Capillary: 182 mg/dL — ABNORMAL HIGH (ref 70–99)
Glucose-Capillary: 98 mg/dL (ref 70–99)

## 2020-11-20 LAB — CBC WITH DIFFERENTIAL/PLATELET
Abs Immature Granulocytes: 0.07 10*3/uL (ref 0.00–0.07)
Basophils Absolute: 0.1 10*3/uL (ref 0.0–0.1)
Basophils Relative: 1 %
Eosinophils Absolute: 0.2 10*3/uL (ref 0.0–0.5)
Eosinophils Relative: 2 %
HCT: 41.9 % (ref 36.0–46.0)
Hemoglobin: 14 g/dL (ref 12.0–15.0)
Immature Granulocytes: 1 %
Lymphocytes Relative: 18 %
Lymphs Abs: 2 10*3/uL (ref 0.7–4.0)
MCH: 28.2 pg (ref 26.0–34.0)
MCHC: 33.4 g/dL (ref 30.0–36.0)
MCV: 84.3 fL (ref 80.0–100.0)
Monocytes Absolute: 1.1 10*3/uL — ABNORMAL HIGH (ref 0.1–1.0)
Monocytes Relative: 11 %
Neutro Abs: 7.4 10*3/uL (ref 1.7–7.7)
Neutrophils Relative %: 67 %
Platelets: 220 10*3/uL (ref 150–400)
RBC: 4.97 MIL/uL (ref 3.87–5.11)
RDW: 15.9 % — ABNORMAL HIGH (ref 11.5–15.5)
WBC: 10.8 10*3/uL — ABNORMAL HIGH (ref 4.0–10.5)
nRBC: 0 % (ref 0.0–0.2)

## 2020-11-20 LAB — BASIC METABOLIC PANEL
Anion gap: 14 (ref 5–15)
Anion gap: 16 — ABNORMAL HIGH (ref 5–15)
Anion gap: 13 (ref 5–15)
BUN: 9 mg/dL (ref 6–20)
BUN: 8 mg/dL (ref 6–20)
BUN: 6 mg/dL (ref 6–20)
CO2: 14 mmol/L — ABNORMAL LOW (ref 22–32)
CO2: 17 mmol/L — ABNORMAL LOW (ref 22–32)
CO2: 18 mmol/L — ABNORMAL LOW (ref 22–32)
Calcium: 8.7 mg/dL — ABNORMAL LOW (ref 8.9–10.3)
Calcium: 8.8 mg/dL — ABNORMAL LOW (ref 8.9–10.3)
Calcium: 8.7 mg/dL — ABNORMAL LOW (ref 8.9–10.3)
Chloride: 110 mmol/L (ref 98–111)
Chloride: 103 mmol/L (ref 98–111)
Chloride: 105 mmol/L (ref 98–111)
Creatinine, Ser: 0.71 mg/dL (ref 0.44–1.00)
Creatinine, Ser: 0.73 mg/dL (ref 0.44–1.00)
Creatinine, Ser: 0.63 mg/dL (ref 0.44–1.00)
GFR, Estimated: >60 mL/min (ref >60)
GFR, Estimated: >60 mL/min (ref >60)
GFR, Estimated: >60 mL/min (ref >60)
Glucose, Bld: 93 mg/dL (ref 70–99)
Glucose, Bld: 174 mg/dL — ABNORMAL HIGH (ref 70–99)
Glucose, Bld: 119 mg/dL — ABNORMAL HIGH (ref 70–99)
Potassium: 3.5 mmol/L (ref 3.5–5.1)
Potassium: 3.9 mmol/L (ref 3.5–5.1)
Potassium: 4.5 mmol/L (ref 3.5–5.1)
Sodium: 138 mmol/L (ref 135–145)
Sodium: 136 mmol/L (ref 135–145)
Sodium: 136 mmol/L (ref 135–145)

## 2020-11-20 LAB — COMPREHENSIVE METABOLIC PANEL
ALT: 12 U/L (ref 0–44)
AST: 23 U/L (ref 15–41)
Albumin: 3.2 g/dL — ABNORMAL LOW (ref 3.5–5.0)
Alkaline Phosphatase: 77 U/L (ref 38–126)
Anion gap: 12 (ref 5–15)
BUN: 9 mg/dL (ref 6–20)
CO2: 16 mmol/L — ABNORMAL LOW (ref 22–32)
Calcium: 8.6 mg/dL — ABNORMAL LOW (ref 8.9–10.3)
Chloride: 106 mmol/L (ref 98–111)
Creatinine, Ser: 0.66 mg/dL (ref 0.44–1.00)
GFR, Estimated: >60 mL/min (ref >60)
Glucose, Bld: 121 mg/dL — ABNORMAL HIGH (ref 70–99)
Potassium: 3.1 mmol/L — ABNORMAL LOW (ref 3.5–5.1)
Sodium: 134 mmol/L — ABNORMAL LOW (ref 135–145)
Total Bilirubin: 1.5 mg/dL — ABNORMAL HIGH (ref 0.3–1.2)
Total Protein: 5.9 g/dL — ABNORMAL LOW (ref 6.5–8.1)

## 2020-11-20 LAB — HEMOGLOBIN A1C
Hgb A1c MFr Bld: 6.9 % — ABNORMAL HIGH (ref 4.8–5.6)
Mean Plasma Glucose: 151.33 mg/dL

## 2020-11-20 LAB — MAGNESIUM: Magnesium: 1.8 mg/dL (ref 1.7–2.4)

## 2020-11-20 LAB — HIV ANTIBODY (ROUTINE TESTING W REFLEX): HIV Screen 4th Generation wRfx: NONREACTIVE

## 2020-11-20 LAB — PHOSPHORUS: Phosphorus: 1.3 mg/dL — ABNORMAL LOW (ref 2.5–4.6)

## 2020-11-20 MED ORDER — SODIUM CHLORIDE 0.9 % IV SOLN
INTRAVENOUS | Status: DC
  Administered 2020-11-20: 75 mL/h via INTRAVENOUS

## 2020-11-20 MED ORDER — POTASSIUM PHOSPHATES 15 MMOLE/5ML IV SOLN
20.0000 mmol | Freq: Once | INTRAVENOUS | Status: AC
  Administered 2020-11-20: 20 mmol via INTRAVENOUS
  Filled 2020-11-20: qty 6.67

## 2020-11-20 MED ORDER — K PHOS MONO-SOD PHOS DI & MONO 155-852-130 MG PO TABS
500.0000 mg | ORAL_TABLET | Freq: Two times a day (BID) | ORAL | Status: AC
  Administered 2020-11-20 (×2): 500 mg via ORAL
  Filled 2020-11-20 (×2): qty 2

## 2020-11-20 MED ORDER — POTASSIUM CHLORIDE CRYS ER 20 MEQ PO TBCR
40.0000 meq | EXTENDED_RELEASE_TABLET | Freq: Once | ORAL | Status: AC
  Administered 2020-11-20: 40 meq via ORAL
  Filled 2020-11-20: qty 2

## 2020-11-20 MED ORDER — INSULIN ASPART 100 UNIT/ML IJ SOLN
0.0000 [IU] | Freq: Three times a day (TID) | INTRAMUSCULAR | Status: DC
  Administered 2020-11-20 – 2020-11-21 (×2): 2 [IU] via SUBCUTANEOUS

## 2020-11-20 MED ORDER — CHLORHEXIDINE GLUCONATE CLOTH 2 % EX PADS
6.0000 | MEDICATED_PAD | Freq: Every day | CUTANEOUS | Status: DC
  Administered 2020-11-20 – 2020-11-22 (×3): 6 via TOPICAL

## 2020-11-20 MED ORDER — INSULIN GLARGINE-YFGN 100 UNIT/ML ~~LOC~~ SOLN
10.0000 [IU] | Freq: Every day | SUBCUTANEOUS | Status: DC
  Administered 2020-11-20 – 2020-11-22 (×3): 10 [IU] via SUBCUTANEOUS
  Filled 2020-11-20 (×3): qty 0.1

## 2020-11-20 MED ORDER — INSULIN ASPART 100 UNIT/ML IJ SOLN: 0.0000 [IU] | Freq: Every day | INTRAMUSCULAR | Status: DC

## 2020-11-20 MED ORDER — BISACODYL 10 MG RE SUPP
10.0000 mg | Freq: Every day | RECTAL | Status: DC | PRN
  Administered 2020-11-20: 10 mg via RECTAL
  Filled 2020-11-20: qty 1

## 2020-11-20 NOTE — Plan of Care (Signed)
  Problem: Respiratory: Goal: Will regain and/or maintain adequate ventilation Outcome: Progressing

## 2020-11-20 NOTE — Progress Notes (Signed)
    OVERNIGHT PROGRESS REPORT  Notified by RN for inability of assigned floor to take DKA/insulin drip. Patient meds were stopped prior to transfer to the floor. Lab orders confirmed drawn with lab. Reviewed Transferred patient to SDU and staff started medications Patient is now on Insulin drip as previously ordered and in the dextrose containing IV fluid phase also. Protocol in use     Gershon Cull MSNA MSN ACNPC-AG Acute Care Nurse Practitioner [place]

## 2020-11-20 NOTE — Plan of Care (Signed)
Pt transitioned to sliding scale insulin and long acting. Electrolytes replenished. Severe constipation resolved post dulcolax suppository given. Tolerating small meals and sips. Ambulates to bathroom with standy by assist. Neuro intact, Room air, 1 PIV patent and intact, moisture rash to lower abdominal folds; interdry applied. Call bell within reach. Fall precautions in place.   Problem: Cardiac: Goal: Ability to maintain an adequate cardiac output will improve Outcome: Progressing   Problem: Fluid Volume: Goal: Ability to achieve a balanced intake and output will improve Outcome: Progressing   Problem: Education: Goal: Ability to describe self-care measures that may prevent or decrease complications (Diabetes Survival Skills Education) will improve Outcome: Completed/Met   Problem: Health Behavior/Discharge Planning: Goal: Ability to identify and utilize available resources and services will improve Outcome: Completed/Met   Problem: Metabolic: Goal: Ability to maintain appropriate glucose levels will improve Outcome: Completed/Met

## 2020-11-20 NOTE — Progress Notes (Signed)
PROGRESS NOTE  [name]  DOB: October 28, 1960  PCP: Reynold Bowen, MD [id number]  DOA: 11/19/2020  LOS: 0 days  Hospital Day: 2   Chief Complaint  Patient presents with   Nausea   Chills   Weakness    Brief narrative: [name] is a 60 y.o. female with PMH significant for DM2, who recently had gastric sleeve procedure done on 10/15/20 by Dr. Hassell Done and had her antidiabetic regimen adjusted by her endocrinologist twice since then. Patient presented to the ED on 11/19/2020 with nausea and vomiting for 3 to 4 days, poor oral tolerance to any food or drink.  In the ED, patient was afebrile, heart rate 102, blood pressure 126/79, breathing comfortably on room air. Labs with venous blood gas showing pH 7.17, serum bicarbonate 11, PCO2 30 Chemistry panel with anion gap 20, blood glucose level 183, lactic acid 1.8, PT hydroxybutyric acid level more than 8. WBC count 14,000 Urinalysis with 6-10 WBCs, hyaline cast, 80 ketones. CT abdomen pelvis showed expected postoperative finding and no acute intra abdominal pathology. Patient was admitted to hospitalist service as euglycemic DKA. Managed per DKA protocol. See below for details.  Subjective: Patient was seen and examined this morning.  Pleasant middle-aged Caucasian female.  Propped up in bed.  Not in distress.  No new symptoms.  Assessment/Plan: DKA -Presented with 3 to 4 days of nausea, vomiting, pH low at 7.17, serum bicarb low at 11 -Initially managed with agreeable to go with insulin drip, IV fluid, electrolyte monitoring -This morning, blood sugar level was 125, anion gap closed.  Serum bicarbonate showed improvement.  I switched her from insulin drip to Lantus 10 units daily with sliding scale insulin. -Diabetic diet.  IV Zofran as needed. -First-ever DKA  Type 2 diabetes mellitus -A1c 6.9 on 11/20/2020.  Patient had a A1c of 7.5% on 10/11/2020 prior to gastric sleeve procedure.  Patient states he has not been  requiring insulin for last 10 years.  Postprocedure, she followed up with her endocrinologist and was taken off semaglutide and Januvia. She was started on Jardiance. -For now, we have started on Lantus 10 units daily with sliding scale insulin.  Continue to monitor. -Prior to discharge, will discuss the case with her endocrinologist Dr. Neita Garnet. Recent Labs  Lab 11/20/20 0519 11/20/20 0621 11/20/20 0754 11/20/20 1017 11/20/20 1302  GLUCAP 134* 125* 119* 122* 129*   Hypokalemia/hyper phosphatemia -Secondary to nausea and vomiting.  Replacement ordered.  Repeat labs tomorrow. Recent Labs  Lab 11/19/20 1532 11/19/20 1841 11/19/20 2034 11/20/20 0047 11/20/20 0458  K 4.4  --  3.8 3.5 3.1*  MG  --  2.4  --   --  1.8  PHOS  --   --   --   --  1.3*   Abnormal urinalysis -Hyaline cast noted probably because of dehydration. -IV hydration to continue with normal saline at 75 mill per hour.    Leukocytosis -Likely due to DKA itself.  Improving without antibiotics. Recent Labs  Lab 11/19/20 1532 11/19/20 1715 11/20/20 0458  WBC 14.1*  --  10.8*  LATICACIDVEN  --  1.8  --    Allergic rhinitis -Continue Zyrtec.  GERD -Continue Protonix.  Constipation -Reports no bowel movement for last 10 days.  It showed slightly prominent stool ball in rectum.  Benign abdominal nodules -CT abdomen on admission showed left adrenal nodule less than 2 cm size, probably benign.  Repeat CT scan recommended in 12 months.  Mobility: Encourage ambulation  Code Status:   Code Status: Full Code  Nutritional status: Body mass index is 41.85 kg/m.     Diet:  Diet Order             Diet Carb Modified Fluid consistency: Thin; Room service appropriate? Yes  Diet effective now                  DVT prophylaxis:  SCDs Start: 11/19/20 1830   Antimicrobials: None Fluid: Normal saline at 75 mill per hour Consultants: None Family Communication: None  Status is: Observation  Remains  inpatient appropriate because: Needs IV hydration, glucose level monitoring  Dispo: The patient is from: Home              Anticipated d/c is to: Home in 1 to 2 days              Patient currently is not medically stable to d/c.   Difficult to place patient No     Infusions:   sodium chloride 75 mL/hr (11/20/20 0844)   dextrose 5 % and 0.45 % NaCl with KCl 10 mEq/L Stopped (11/20/20 0847)    Scheduled Meds:  Chlorhexidine Gluconate Cloth  6 each Topical Daily   insulin aspart  0-5 Units Subcutaneous QHS   insulin aspart  0-9 Units Subcutaneous TID WC   insulin glargine-yfgn  10 Units Subcutaneous Daily   pantoprazole (PROTONIX) IV  40 mg Intravenous QHS   phosphorus  500 mg Oral BID    Antimicrobials: Anti-infectives (From admission, onward)    None       PRN meds: acetaminophen **OR** acetaminophen, bisacodyl, dextrose, fentaNYL (SUBLIMAZE) injection, naLOXone (NARCAN)  injection, ondansetron (ZOFRAN) IV   Objective: Vitals:   11/20/20 1400 11/20/20 1500  BP: 127/61 (!) 143/65  Pulse: 82 77  Resp: 20 16  Temp:    SpO2: 99% 97%    Intake/Output Summary (Last 24 hours) at 11/20/2020 1559 Last data filed at 11/20/2020 0522 Gross per 24 hour  Intake 4144.69 ml  Output --  Net 4144.69 ml   Filed Weights   11/19/20 1445 11/19/20 2300 11/20/20 0249  Weight: 102.1 kg 103.8 kg 103.8 kg   Weight change:  Body mass index is 41.85 kg/m.   Physical Exam: General exam: Pleasant, middle-aged Caucasian female.  Not in physical distress Skin: No rashes, lesions or ulcers. HEENT: Atraumatic, normocephalic, no obvious bleeding Lungs: Clear to auscultation bilaterally CVS: Regular rate and rhythm, no murmur GI/Abd soft, nontender, nondistended, bowel sound present CNS: Alert, awake, oriented x3 Psychiatry: Mood appropriate Extremities: No pedal edema, no calf tenderness  Data Review: I have personally reviewed the laboratory data and studies available.  Recent Labs   Lab 11/19/20 1532 11/20/20 0458  WBC 14.1* 10.8*  NEUTROABS 11.6* 7.4  HGB 17.9* 14.0  HCT 55.6* 41.9  MCV 86.5 84.3  PLT 336 220   Recent Labs  Lab 11/19/20 1532 11/19/20 1841 11/19/20 2034 11/20/20 0047 11/20/20 0458  NA 135  --  136 138 134*  K 4.4  --  3.8 3.5 3.1*  CL 104  --  104 110 106  CO2 11*  --  15* 14* 16*  GLUCOSE 183*  --  123* 93 121*  BUN 14  --  '11 9 9  '$ CREATININE 0.90  --  0.68 0.71 0.66  CALCIUM 9.7  --  8.7* 8.7* 8.6*  MG  --  2.4  --   --  1.8  PHOS  --   --   --   --  1.3*    F/u labs ordered Unresulted Labs (From admission, onward)     Start     Ordered   11/21/20 0500  CBC with Differential/Platelet  Daily,   R      11/20/20 0757   11/21/20 [id number]  Basic metabolic panel  Daily,   R      11/20/20 0757   11/21/20 0500  Magnesium  Tomorrow morning,   STAT        11/20/20 0757   11/21/20 0500  Phosphorus  Tomorrow morning,   R        11/20/20 0757   11/20/20 0205  MRSA Next Gen by PCR, Nasal  Once,   R        11/20/20 0205            Signed, Terrilee Croak, MD [facility] 11/20/2020

## 2020-11-21 DIAGNOSIS — E1110 Type 2 diabetes mellitus with ketoacidosis without coma: Secondary | ICD-10-CM | POA: Diagnosis not present

## 2020-11-21 LAB — CBC WITH DIFFERENTIAL/PLATELET
Abs Immature Granulocytes: 0.03 10*3/uL (ref 0.00–0.07)
Basophils Absolute: 0 10*3/uL (ref 0.0–0.1)
Basophils Relative: 0 %
Eosinophils Absolute: 0.3 10*3/uL (ref 0.0–0.5)
Eosinophils Relative: 3 %
HCT: 40.4 % (ref 36.0–46.0)
Hemoglobin: 13.7 g/dL (ref 12.0–15.0)
Immature Granulocytes: 0 %
Lymphocytes Relative: 24 %
Lymphs Abs: 2.2 10*3/uL (ref 0.7–4.0)
MCH: 28.2 pg (ref 26.0–34.0)
MCHC: 33.9 g/dL (ref 30.0–36.0)
MCV: 83.1 fL (ref 80.0–100.0)
Monocytes Absolute: 0.8 10*3/uL (ref 0.1–1.0)
Monocytes Relative: 9 %
Neutro Abs: 5.9 10*3/uL (ref 1.7–7.7)
Neutrophils Relative %: 64 %
Platelets: 196 10*3/uL (ref 150–400)
RBC: 4.86 MIL/uL (ref 3.87–5.11)
RDW: 16.3 % — ABNORMAL HIGH (ref 11.5–15.5)
WBC: 9.2 10*3/uL (ref 4.0–10.5)
nRBC: 0 % (ref 0.0–0.2)

## 2020-11-21 LAB — BASIC METABOLIC PANEL
Anion gap: 12 (ref 5–15)
BUN: 6 mg/dL (ref 6–20)
CO2: 17 mmol/L — ABNORMAL LOW (ref 22–32)
Calcium: 8.5 mg/dL — ABNORMAL LOW (ref 8.9–10.3)
Chloride: 107 mmol/L (ref 98–111)
Creatinine, Ser: 0.49 mg/dL (ref 0.44–1.00)
GFR, Estimated: >60 mL/min (ref >60)
Glucose, Bld: 87 mg/dL (ref 70–99)
Potassium: 2.9 mmol/L — ABNORMAL LOW (ref 3.5–5.1)
Sodium: 136 mmol/L (ref 135–145)

## 2020-11-21 LAB — MAGNESIUM: Magnesium: 1.9 mg/dL (ref 1.7–2.4)

## 2020-11-21 LAB — GLUCOSE, CAPILLARY
Glucose-Capillary: 119 mg/dL — ABNORMAL HIGH (ref 70–99)
Glucose-Capillary: 176 mg/dL — ABNORMAL HIGH (ref 70–99)
Glucose-Capillary: 112 mg/dL — ABNORMAL HIGH (ref 70–99)
Glucose-Capillary: 134 mg/dL — ABNORMAL HIGH (ref 70–99)

## 2020-11-21 LAB — PHOSPHORUS: Phosphorus: 3 mg/dL (ref 2.5–4.6)

## 2020-11-21 MED ORDER — MAGNESIUM SULFATE IN D5W 1-5 GM/100ML-% IV SOLN
1.0000 g | Freq: Once | INTRAVENOUS | Status: AC
  Administered 2020-11-21: 1 g via INTRAVENOUS
  Filled 2020-11-21: qty 100

## 2020-11-21 MED ORDER — POTASSIUM CHLORIDE 10 MEQ/100ML IV SOLN
10.0000 meq | INTRAVENOUS | Status: DC
  Administered 2020-11-21 (×3): 10 meq via INTRAVENOUS
  Filled 2020-11-21 (×6): qty 100

## 2020-11-21 MED ORDER — DEXTROSE-NACL 5-0.45 % IV SOLN: INTRAVENOUS | Status: DC

## 2020-11-21 MED ORDER — POTASSIUM CHLORIDE CRYS ER 20 MEQ PO TBCR
60.0000 meq | EXTENDED_RELEASE_TABLET | Freq: Once | ORAL | Status: AC
  Administered 2020-11-21: 60 meq via ORAL
  Filled 2020-11-21: qty 3

## 2020-11-21 MED ORDER — SODIUM BICARBONATE 650 MG PO TABS
1300.0000 mg | ORAL_TABLET | Freq: Two times a day (BID) | ORAL | Status: DC
  Administered 2020-11-21 – 2020-11-22 (×3): 1300 mg via ORAL
  Filled 2020-11-21 (×3): qty 2

## 2020-11-21 NOTE — Progress Notes (Signed)
PROGRESS NOTE  [name]  DOB: 03/30/1960  PCP: Reynold Bowen, MD ZT:[id number]  DOA: 11/19/2020  LOS: 0 days  Hospital Day: 3   Chief Complaint  Patient presents with   Nausea   Chills   Weakness    Brief narrative: [name] is a 60 y.o. female with PMH significant for DM2, who recently had gastric sleeve procedure done on 10/15/20 by Dr. Hassell Done and had her antidiabetic regimen adjusted by her endocrinologist twice since then. Patient presented to the ED on 11/19/2020 with nausea and vomiting for 3 to 4 days, poor oral tolerance to any food or drink.  In the ED, patient was afebrile, heart rate 102, blood pressure 126/79, breathing comfortably on room air. Labs with venous blood gas showing pH 7.17, serum bicarbonate 11, PCO2 30 Chemistry panel with anion gap 20, blood glucose level 183, lactic acid 1.8, PT hydroxybutyric acid level more than 8. WBC count 14,000 Urinalysis with 6-10 WBCs, hyaline cast, 80 ketones. CT abdomen pelvis showed expected postoperative finding and no acute intra abdominal pathology. Patient was admitted to hospitalist service as euglycemic DKA. Managed per DKA protocol. See below for details.  Subjective: Patient was seen and examined this morning.   Propped up in bed.  Not in distress.  No new symptoms.  Had good bowel yesterday.  Feels better after that.   Labs this morning with potassium low at 2.9, serum bicarb low at 17, glucose level 119 this morning.    Assessment/Plan: DKA - resolved -First-ever DKA.   -Presented with 3 to 4 days of nausea, vomiting, pH low at 7.17, serum bicarb low at 11 -Initially managed with DKA protocol with insulin drip, IV fluid, electrolyte monitoring. -Later switched to subcutaneous insulin. -I wonder if patient had constipation leading to nausea vomiting, oral intolerance and subsequently ketoacidosis.  No evidence of infection.  Type 2 diabetes mellitus -A1c 6.9 on 11/20/2020.  Patient had a  A1c of 7.5% on 10/11/2020 prior to gastric sleeve procedure.  Patient states he has not been requiring insulin for last 12 years.  After the gastric sleeve procedure, she followed up with her endocrinologist and was taken off semaglutide and Januvia and started on Jardiance.  -Currently, Jardiance is on hold.  She was given Lantus 10 units daily yesterday.  We will continue the same for today.  Continue sliding scale insulin with Accu-Cheks. -Prior to discharge, will discuss the case with her endocrinologist Dr. Neita Garnet. Recent Labs  Lab 11/20/20 1017 11/20/20 1302 11/20/20 1726 11/20/20 2132 11/21/20 0745  GLUCAP 122* 129* 182* 113* 119*   Hypokalemia/hypophosphatemia -Potassium level low at 2.9 this morning.  To nausea and vomiting.  Replacement ordered.  Repeat labs tomorrow. Recent Labs  Lab 11/19/20 1841 11/19/20 2034 11/20/20 0047 11/20/20 0458 11/20/20 1742 11/20/20 2123 11/21/20 0239  K  --    < > 3.5 3.1* 3.9 4.5 2.9*  MG 2.4  --   --  1.8  --   --  1.9  PHOS  --   --   --  1.3*  --   --  3.0   < > = values in this interval not displayed.   Abnormal urinalysis -Hyaline cast noted probably because of dehydration. -IV hydration to continue with normal saline at 75 mill per hour.    Leukocytosis -Likely due to DKA itself.  Improving without antibiotics. Recent Labs  Lab 11/19/20 1532 11/19/20 1715 11/20/20 0458 11/21/20 0239  WBC 14.1*  --  10.8*  9.2  LATICACIDVEN  --  1.8  --   --    Allergic rhinitis -Continue Zyrtec.  GERD -Continue Protonix.  Constipation -No bowel movement for 10 days on presentation.  CT scan showed slightly prominent stool ball in rectum. Patient had a bowel movement yesterday with Dulcolax suppository.  Benign abdominal nodules -CT abdomen on admission showed left adrenal nodule less than 2 cm size, probably benign.  Repeat CT scan recommended in 12 months.  Mobility: Encourage ambulation Code Status:   Code Status: Full Code   Nutritional status: Body mass index is 42.62 kg/m.     Diet:  Diet Order             Diet Carb Modified Fluid consistency: Thin; Room service appropriate? Yes  Diet effective now                  DVT prophylaxis:  SCDs Start: 11/19/20 1830   Antimicrobials: None Fluid: Normal saline at 75 mill per hour Consultants: None Family Communication: None  Status is: Observation  Remains inpatient appropriate because: Needs IV hydration, glucose level monitoring  Dispo: The patient is from: Home              Anticipated d/c is to: Home in 1 to 2 days              Patient currently is not medically stable to d/c.   Difficult to place patient No     Infusions:     Scheduled Meds:  Chlorhexidine Gluconate Cloth  6 each Topical Daily   insulin aspart  0-5 Units Subcutaneous QHS   insulin aspart  0-9 Units Subcutaneous TID WC   insulin glargine-yfgn  10 Units Subcutaneous Daily   pantoprazole (PROTONIX) IV  40 mg Intravenous QHS   sodium bicarbonate  1,300 mg Oral BID    Antimicrobials: Anti-infectives (From admission, onward)    None       PRN meds: acetaminophen **OR** acetaminophen, bisacodyl, dextrose, ondansetron (ZOFRAN) IV   Objective: Vitals:   11/21/20 0900 11/21/20 1000  BP: (!) 112/59 (!) 151/64  Pulse: 77 87  Resp: 18 14  Temp:    SpO2: 97% 97%    Intake/Output Summary (Last 24 hours) at 11/21/2020 1022 Last data filed at 11/21/2020 0600 Gross per 24 hour  Intake 2698.84 ml  Output 1 ml  Net 2697.84 ml   Filed Weights   11/19/20 2300 11/20/20 0249 11/21/20 0500  Weight: 103.8 kg 103.8 kg 105.7 kg   Weight change: 3.641 kg Body mass index is 42.62 kg/m.   Physical Exam: General exam: Pleasant, middle-aged Caucasian female.  Not in physical distress Skin: No rashes, lesions or ulcers. HEENT: Atraumatic, normocephalic, no obvious bleeding Lungs: C clear to auscultation bilaterally CVS: Regular rate and rhythm, no murmur GI/Abd soft,  nontender, nondistended, bowel sound present CNS: Alert, awake, oriented x3 Psychiatry: Mood appropriate Extremities: No pedal edema, no calf tenderness  Data Review: I have personally reviewed the laboratory data and studies available.  Recent Labs  Lab 11/19/20 1532 11/20/20 0458 11/21/20 0239  WBC 14.1* 10.8* 9.2  NEUTROABS 11.6* 7.4 5.9  HGB 17.9* 14.0 13.7  HCT 55.6* 41.9 40.4  MCV 86.5 84.3 83.1  PLT 336 220 196   Recent Labs  Lab 11/19/20 1841 11/19/20 2034 11/20/20 0047 11/20/20 0458 11/20/20 1742 11/20/20 2123 11/21/20 0239  NA  --    < > 138 134* 136 136 136  K  --    < >  3.5 3.1* 3.9 4.5 2.9*  CL  --    < > 110 106 103 105 107  CO2  --    < > 14* 16* 17* 18* 17*  GLUCOSE  --    < > 93 121* 174* 119* 87  BUN  --    < > '9 9 8 6 6  '$ CREATININE  --    < > 0.71 0.66 0.73 0.63 0.49  CALCIUM  --    < > 8.7* 8.6* 8.8* 8.7* 8.5*  MG 2.4  --   --  1.8  --   --  1.9  PHOS  --   --   --  1.3*  --   --  3.0   < > = values in this interval not displayed.    F/u labs ordered Unresulted Labs (From admission, onward)     Start     Ordered   11/21/20 0500  CBC with Differential/Platelet  Daily,   R      11/20/20 0757   11/21/20 [id number]  Basic metabolic panel  Daily,   R      11/20/20 0757   11/20/20 0205  MRSA Next Gen by PCR, Nasal  Once,   R        11/20/20 0205            Signed, Terrilee Croak, MD [facility] 11/21/2020

## 2020-11-21 NOTE — Progress Notes (Signed)
Pt sitting up in recliner upon arrival to room.  Discussed with patient Phase 3 diet following gastric sleeve on 10/15/20.  Pt states that she is "doing the best she can" ordering items from the menu that are high protein, low fat. Will f/u as needed.

## 2020-11-21 NOTE — Progress Notes (Signed)
Inpatient Diabetes Program Recommendations  AACE/ADA: New Consensus Statement on Inpatient Glycemic Control (2015)  Target Ranges:  Prepandial:   less than 140 mg/dL      Peak postprandial:   less than 180 mg/dL (1-2 hours)      Critically ill patients:  140 - 180 mg/dL   Lab Results  Component Value Date   GLUCAP 112 (H) 11/21/2020   HGBA1C 6.9 (H) 11/20/2020    Review of Glycemic Control  Diabetes history: DM2 Outpatient Diabetes medications: Jardiance 25 mg QHS, previously on semaglutide and Januvia Current orders for Inpatient glycemic control:  Semglee 10 QD, Novolog 0-9 units TID with meals and 0-5 HS  HgbA1C - 6.9% Has only received 2 units of Novolog today for sliding scale.  Lost over 30# since bariatric surgery on 10/15/20.  Inpatient Diabetes Program Recommendations:    With HgbA1C of 6.9% and continued weight loss, would not send pt home on any diabetes medications at this point.  Long conversation with pt about starting Jardiance and having symptoms of DKA. HgbA1C has improved in 1.5 months from 7.5% to 6.9%. Hospitalist to call Endo and ask for recommendations for discharge. Instructed pt to continue checking blood sugars daily and f/u with Endo.  Thank you. Lorenda Peck, RD, LDN, CDE Inpatient Diabetes Coordinator [phone]

## 2020-11-22 DIAGNOSIS — E1110 Type 2 diabetes mellitus with ketoacidosis without coma: Secondary | ICD-10-CM | POA: Diagnosis not present

## 2020-11-22 LAB — CBC WITH DIFFERENTIAL/PLATELET
Abs Immature Granulocytes: 0.02 10*3/uL (ref 0.00–0.07)
Basophils Absolute: 0 10*3/uL (ref 0.0–0.1)
Basophils Relative: 0 %
Eosinophils Absolute: 0.3 10*3/uL (ref 0.0–0.5)
Eosinophils Relative: 3 %
HCT: 38.7 % (ref 36.0–46.0)
Hemoglobin: 13 g/dL (ref 12.0–15.0)
Immature Granulocytes: 0 %
Lymphocytes Relative: 38 %
Lymphs Abs: 2.9 10*3/uL (ref 0.7–4.0)
MCH: 28 pg (ref 26.0–34.0)
MCHC: 33.6 g/dL (ref 30.0–36.0)
MCV: 83.2 fL (ref 80.0–100.0)
Monocytes Absolute: 0.7 10*3/uL (ref 0.1–1.0)
Monocytes Relative: 9 %
Neutro Abs: 3.7 10*3/uL (ref 1.7–7.7)
Neutrophils Relative %: 50 %
Platelets: 186 10*3/uL (ref 150–400)
RBC: 4.65 MIL/uL (ref 3.87–5.11)
RDW: 16.3 % — ABNORMAL HIGH (ref 11.5–15.5)
WBC: 7.6 10*3/uL (ref 4.0–10.5)
nRBC: 0 % (ref 0.0–0.2)

## 2020-11-22 LAB — BASIC METABOLIC PANEL
Anion gap: 11 (ref 5–15)
BUN: 8 mg/dL (ref 6–20)
CO2: 22 mmol/L (ref 22–32)
Calcium: 9.1 mg/dL (ref 8.9–10.3)
Chloride: 110 mmol/L (ref 98–111)
Creatinine, Ser: 0.55 mg/dL (ref 0.44–1.00)
GFR, Estimated: >60 mL/min (ref >60)
Glucose, Bld: 87 mg/dL (ref 70–99)
Potassium: 3.5 mmol/L (ref 3.5–5.1)
Sodium: 143 mmol/L (ref 135–145)

## 2020-11-22 LAB — GLUCOSE, CAPILLARY
Glucose-Capillary: 82 mg/dL (ref 70–99)
Glucose-Capillary: 110 mg/dL — ABNORMAL HIGH (ref 70–99)

## 2020-11-22 MED ORDER — HYDROCORTISONE ACETATE 25 MG RE SUPP: 25.0000 mg | Freq: Two times a day (BID) | RECTAL | 0 refills | Status: AC

## 2020-11-22 MED ORDER — PANTOPRAZOLE SODIUM 40 MG PO TBEC: 40.0000 mg | DELAYED_RELEASE_TABLET | Freq: Every day | ORAL | Status: DC

## 2020-11-22 MED ORDER — BLOOD GLUCOSE METER KIT: PACK | 0 refills | Status: AC

## 2020-11-22 MED ORDER — "PEN NEEDLES 3/16"" 31G X 5 MM MISC": Status: AC

## 2020-11-22 MED ORDER — INSULIN GLARGINE 100 UNIT/ML SOLOSTAR PEN: 10.0000 [IU] | PEN_INJECTOR | Freq: Every day | SUBCUTANEOUS | 0 refills | Status: DC

## 2020-11-22 NOTE — Plan of Care (Signed)
  Problem: Education: Goal: Individualized Educational Video(s) Outcome: Adequate for Discharge   Problem: Cardiac: Goal: Ability to maintain an adequate cardiac output will improve Outcome: Adequate for Discharge   Problem: Health Behavior/Discharge Planning: Goal: Ability to manage health-related needs will improve Outcome: Adequate for Discharge   Problem: Fluid Volume: Goal: Ability to achieve a balanced intake and output will improve Outcome: Adequate for Discharge   Problem: Nutritional: Goal: Maintenance of adequate nutrition will improve Outcome: Adequate for Discharge Goal: Maintenance of adequate weight for body size and type will improve Outcome: Adequate for Discharge   Problem: Respiratory: Goal: Will regain and/or maintain adequate ventilation Outcome: Adequate for Discharge   Problem: Urinary Elimination: Goal: Ability to achieve and maintain adequate renal perfusion and functioning will improve Outcome: Adequate for Discharge   Problem: Education: Goal: Knowledge of General Education information will improve Description: Including pain rating scale, medication(s)/side effects and non-pharmacologic comfort measures Outcome: Adequate for Discharge   Problem: Health Behavior/Discharge Planning: Goal: Ability to manage health-related needs will improve Outcome: Adequate for Discharge   Problem: Clinical Measurements: Goal: Ability to maintain clinical measurements within normal limits will improve Outcome: Adequate for Discharge Goal: Will remain free from infection Outcome: Adequate for Discharge Goal: Diagnostic test results will improve Outcome: Adequate for Discharge Goal: Respiratory complications will improve Outcome: Adequate for Discharge Goal: Cardiovascular complication will be avoided Outcome: Adequate for Discharge   Problem: Activity: Goal: Risk for activity intolerance will decrease Outcome: Adequate for Discharge   Problem:  Nutrition: Goal: Adequate nutrition will be maintained Outcome: Adequate for Discharge   Problem: Coping: Goal: Level of anxiety will decrease Outcome: Adequate for Discharge   Problem: Elimination: Goal: Will not experience complications related to bowel motility Outcome: Adequate for Discharge Goal: Will not experience complications related to urinary retention Outcome: Adequate for Discharge   Problem: Pain Managment: Goal: General experience of comfort will improve Outcome: Adequate for Discharge   Problem: Safety: Goal: Ability to remain free from injury will improve Outcome: Adequate for Discharge   Problem: Skin Integrity: Goal: Risk for impaired skin integrity will decrease Outcome: Adequate for Discharge

## 2020-11-22 NOTE — Discharge Summary (Addendum)
Physician Discharge Summary  [name] ZMO:[id number] DOB: 14-Sep-1960 DOA: 11/19/2020  PCP: Reynold Bowen, MD  Admit date: 11/19/2020 Discharge date: 11/22/2020  Admitted From: home Discharge disposition: home   Code Status: Full Code   Discharge Diagnosis:   Principal Problem:   DKA (diabetic ketoacidosis) ([place]) Active Problems:   Nausea & vomiting   Dehydration   Leukocytosis   GERD (gastroesophageal reflux disease)   Allergic rhinitis   High anion gap metabolic acidosis     Chief Complaint  Patient presents with   Nausea   Chills   Weakness    Brief narrative: [name] is a 60 y.o. female with PMH significant for DM2, who recently had gastric sleeve procedure done on 10/15/20 by Dr. Hassell Done and had her antidiabetic regimen adjusted by her endocrinologist twice since then. Patient presented to the ED on 11/19/2020 with nausea and vomiting for 3 to 4 days, poor oral tolerance to any food or drink.  In the ED, patient was afebrile, heart rate 102, blood pressure 126/79, breathing comfortably on room air. Labs with venous blood gas showing pH 7.17, serum bicarbonate 11, PCO2 30 Chemistry panel with anion gap 20, blood glucose level 183, lactic acid 1.8, PT hydroxybutyric acid level more than 8. WBC count 14,000 Urinalysis with 6-10 WBCs, hyaline cast, 80 ketones. CT abdomen pelvis showed expected postoperative finding and no acute intra abdominal pathology. Patient was admitted to hospitalist service as euglycemic DKA. Managed per DKA protocol. See below for details.  Subjective: Patient was seen and examined this morning.  Sitting up in chair.  Not in distress.  Taking her breakfast.  No new symptoms.  Sugar level controlled.  Nausea vomiting controlled.  Constipation resolved.  Ready to go home today.  Hospital course DKA - resolved -First-ever DKA.   -Presented with 3 to 4 days of nausea, vomiting, pH was low at 7.17, serum bicarb low at  11 -Initially managed with DKA protocol with insulin drip, IV fluid, electrolyte monitoring. -Later switched to subcutaneous insulin. -I wonder if her severe constipation led to nausea vomiting, oral intolerance and subsequently ketoacidosis.  No evidence of infection.  Type 2 diabetes mellitus -A1c 6.9 on 11/20/2020.  Patient had a A1c of 7.5% on 10/11/2020 prior to gastric sleeve procedure.  Patient states he has not been requiring insulin for last 12 years.  After the gastric sleeve procedure, she followed up with her endocrinologist.  She was taken off semaglutide and Januvia and started on Jardiance.  -In this admission, Jardiance was kept on hold.  Once DKA resolved, she was started on Lantus 10 units daily with which she is having a good control of her blood sugar levels. -I discussed her case with her endocrinologist Dr. Neita Garnet this morning.  He recommended to continue her on Lantus 10 units daily at discharge and not resume Jardiance. -Patient will follow up with him as an outpatient. Recent Labs  Lab 11/21/20 1142 11/21/20 1624 11/21/20 2116 11/22/20 0739 11/22/20 1148  GLUCAP 176* 112* 134* 82 110*   Hypokalemia/hypophosphatemia -Levels improved with replacement. Recent Labs  Lab 11/19/20 1841 11/19/20 2034 11/20/20 0458 11/20/20 1742 11/20/20 2123 11/21/20 0239 11/22/20 0237  K  --    < > 3.1* 3.9 4.5 2.9* 3.5  MG 2.4  --  1.8  --   --  1.9  --   PHOS  --   --  1.3*  --   --  3.0  --    < > =  values in this interval not displayed.   Abnormal urinalysis -Hyaline cast noted probably because of dehydration. -No urinary symptoms.  Did not require antibiotics.  Leukocytosis -Likely due to DKA itself.  WBC count improved without antibiotics. Recent Labs  Lab 11/19/20 1532 11/19/20 1715 11/20/20 0458 11/21/20 0239 11/22/20 0237  WBC 14.1*  --  10.8* 9.2 7.6  LATICACIDVEN  --  1.8  --   --   --    Allergic rhinitis -Continue Zyrtec.  GERD -Continue  Protonix.  Constipation History of hemorrhoids -On admission, patient reported no bowel movement for 10 days on presentation.  CT scan showed slightly prominent stool ball in rectum.  She was given Dulcolax suppository after which she had a good bowel movement and feels relieved.   -Patient states to me that lately she is having pain every time she defecates.  She has a history of hemorrhoids with intermittent rectal bleeding.  She had hard stool ball at presentation which probably precipitated her hemorrhoidal pain.  She may also have had anal fissure.  It seems that she was seen by surgery Dr. Hassell Done this morning as a courtesy visit.  Per patient, he recommended a GI follow-up as an outpatient.  Patient states that she has her next appointment with GI in October.  I discussed with GI PA Nevin Bloodgood and requested an earlier visit.  Their office will call the patient if an earlier visit is available. -For now, I gave the patient a prescription for Anusol suppository.  She will also continue stool softeners.  Benign abdominal nodules -CT abdomen on admission showed left adrenal nodule less than 2 cm size, probably benign.  Repeat CT scan recommended in 12 months.   Allergies as of 11/22/2020       Reactions   Penicillins Hives        Medication List     STOP taking these medications    celecoxib 200 MG capsule Commonly known as: CELEBREX   empagliflozin 25 MG Tabs tablet Commonly known as: JARDIANCE   oxyCODONE 5 MG immediate release tablet Commonly known as: Oxy IR/ROXICODONE       TAKE these medications    acetaminophen 500 MG tablet Commonly known as: TYLENOL Take 1,000 mg by mouth every 6 (six) hours as needed (pain).   blood glucose meter kit and supplies Dispense based on patient and insurance preference. Use up to four times daily as directed. (FOR ICD-10 E10.9, E11.9).   cetirizine 10 MG tablet Commonly known as: ZYRTEC Take 10 mg by mouth at bedtime.   diclofenac  Sodium 1 % Gel Commonly known as: VOLTAREN Apply 2 g topically daily as needed (knee pain).   docusate sodium 100 MG capsule Commonly known as: COLACE Take 300 mg by mouth at bedtime.   hydrocortisone 25 MG suppository Commonly known as: ANUSOL-HC Place 1 suppository (25 mg total) rectally every 12 (twelve) hours.   insulin glargine 100 UNIT/ML Solostar Pen Commonly known as: LANTUS Inject 10 Units into the skin daily.   montelukast 10 MG tablet Commonly known as: SINGULAIR Take 10 mg by mouth daily as needed (allergies).   ondansetron 4 MG disintegrating tablet Commonly known as: ZOFRAN-ODT Take 1 tablet (4 mg total) by mouth every 6 (six) hours as needed for nausea or vomiting.   pantoprazole 40 MG tablet Commonly known as: PROTONIX Take 1 tablet (40 mg total) by mouth daily. What changed: when to take this   Pen Needles 3/16" 31G X 5 MM Misc To use  with Lantus pen.   PRESCRIPTION MEDICATION Apply 1 application topically 3 (three) times daily as needed (colon spasms). Diltiazem 2% ointment compounded by [facility]   Trospium Chloride 60 MG Cp24 Take 60 mg by mouth daily.        Discharge Instructions:  Diet Recommendation:  Discharge Diet Orders (From admission, onward)     Start     Ordered   11/22/20 0000  Diet Carb Modified        11/22/20 1049              Follow with Primary MD Reynold Bowen, MD in 7 days   Get CBC/BMP checked in next visit within 1 week by PCP or SNF MD ( we routinely change or add medications that can affect your baseline labs and fluid status, therefore we recommend that you get the mentioned basic workup next visit with your PCP, your PCP may decide not to get them or add new tests based on their clinical decision)  On your next visit with your PCP, please Get Medicines reviewed and adjusted.  Please request your PCP  to go over all Hospital Tests and Procedure/Radiological results at the follow up, please get all  Hospital records sent to your Prim MD by signing hospital release before you go home.  Activity: As tolerated with Full fall precautions use walker/cane & assistance as needed  For Heart failure patients - Check your Weight same time everyday, if you gain over 2 pounds, or you develop in leg swelling, experience more shortness of breath or chest pain, call your Primary MD immediately. Follow Cardiac Low Salt Diet and 1.5 lit/day fluid restriction.  If you have smoked or chewed Tobacco in the last 2 yrs please stop smoking, stop any regular Alcohol  and or any Recreational drug use.  If you experience worsening of your admission symptoms, develop shortness of breath, life threatening emergency, suicidal or homicidal thoughts you must seek medical attention immediately by calling 911 or calling your MD immediately  if symptoms less severe.  You Must read complete instructions/literature along with all the possible adverse reactions/side effects for all the Medicines you take and that have been prescribed to you. Take any new Medicines after you have completely understood and accpet all the possible adverse reactions/side effects.   Do not drive, operate heavy machinery, perform activities at heights, swimming or participation in water activities or provide baby sitting services if your were admitted for syncope or siezures until you have seen by Primary MD or a Neurologist and advised to do so again.  Do not drive when taking Pain medications.  Do not take more than prescribed Pain, Sleep and Anxiety Medications  Wear Seat belts while driving.   Please note You were cared for by a hospitalist during your hospital stay. If you have any questions about your discharge medications or the care you received while you were in the hospital after you are discharged, you can call the unit and asked to speak with the hospitalist on call if the hospitalist that took care of you is not available. Once you are  discharged, your primary care physician will handle any further medical issues. Please note that NO REFILLS for any discharge medications will be authorized once you are discharged, as it is imperative that you return to your primary care physician (or establish a relationship with a primary care physician if you do not have one) for your aftercare needs so that they can reassess  your need for medications and monitor your lab values.    Follow ups:    Follow-up Information     Reynold Bowen, MD Follow up.   Specialty: Endocrinology Contact information: [address] [place] [address] [phone]                 Wound care:   Incision - 6 Ports Abdomen Right;Lower Medial;Lower Left;Lower Right;Upper Medial;Upper Left;Upper (Active)  Placement Date/Time: 10/15/20 1505   Location of Ports: Abdomen  Location Orientation: Right;Lower  Location Orientation: Medial;Lower  Location Orientation: Left;Lower  Location Orientation: Right;Upper  Location Orientation: Medial;Upper  Location O...    Assessments 10/15/2020  3:04 PM 10/16/2020  8:06 AM  Port 1 Site Assessment -- [employer] 1 Margins -- Attached edges (approximated)  Port 1 Drainage Amount -- None  Port 1 Dressing Type Liquid skin adhesive Liquid skin adhesive  Port 1 Dressing Status -- Clean;Dry;Intact  Port 2 Site Assessment -- [employer] 2 Margins -- Attached edges (approximated)  Port 2 Drainage Amount -- None  Port 2 Dressing Type Liquid skin adhesive Liquid skin adhesive  Port 2 Dressing Status -- Clean;Intact;Dry  Port 3 Site Assessment -- [employer] 3 Margins -- Attached edges (approximated)  Port 3 Drainage Amount -- None  Port 3 Dressing Type Liquid skin adhesive Liquid skin adhesive  Port 3 Dressing Status -- Clean;Dry;Intact  Port 4 Site Assessment -- Clean;Dry  Port 4 Margins -- Attached edges (approximated)  Port 4 Drainage Amount -- None  Port 4 Dressing Type Liquid skin adhesive  Liquid skin adhesive  Port 4 Dressing Status -- Clean;Dry;Intact  Port 5 Site Assessment -- Clean;Dry  Port 5 Margins -- Attached edges (approximated)  Port 5 Dressing Type Liquid skin adhesive Liquid skin adhesive  Port 5 Dressing Status -- Clean;Dry;Intact  Port 6 Site Assessment -- Clean;Dry  Port 6 Margins -- Attached edges (approximated)  Port 6 Drainage Amount -- None  Port 6 Dressing Type Liquid skin adhesive Liquid skin adhesive     No Linked orders to display    Discharge Exam:   Vitals:   11/22/20 1000 11/22/20 1100 11/22/20 1150 11/22/20 1200  BP:   (!) 144/77 (!) 151/79  Pulse: 68 70 75 70  Resp: '17 16 15 ' (!) 21  Temp:   97.9 F (36.6 C)   TempSrc:   Oral   SpO2: 98% 100% 98% 100%  Weight:      Height:        Body mass index is 42.66 kg/m.  General exam: Pleasant, middle-aged Caucasian female. Skin: No rashes, lesions or ulcers. HEENT: Atraumatic, normocephalic, no obvious bleeding Lungs: Clear to auscultation bilaterally CVS: Regular rate and rhythm, no murmur GI/Abd soft, nontender, nondistended, bowel sound present CNS: Alert, awake, oriented x3 Psychiatry: Mood appropriate Extremities: No pedal edema, no calf tenderness  Time coordinating discharge: 35 minutes   The results of significant diagnostics from this hospitalization (including imaging, microbiology, ancillary and laboratory) are listed below for reference.    Procedures and Diagnostic Studies:   CT Abdomen Pelvis W Contrast  Result Date: 11/19/2020 CLINICAL DATA:  Recent gastric sleeve resection by weeks ago. 32 pounds weight loss. Feels like food is stuck in throat. EXAM: CT ABDOMEN AND PELVIS WITH CONTRAST TECHNIQUE: Multidetector CT imaging of the abdomen and pelvis was performed using the standard protocol following bolus administration of intravenous contrast. Sagittal and coronal MPR images reconstructed from axial data set. CONTRAST:  71m OMNIPAQUE IOHEXOL 350 MG/ML  SOLN IV. No oral  contrast. COMPARISON:  08/09/2015 FINDINGS: Lower chest: Lung bases clear Hepatobiliary: Gallbladder surgically absent. Probable focal fatty infiltration of liver adjacent to falciform fissure. Overall decreased fatty infiltration of liver since previous exam. No additional focal hepatic abnormalities or biliary dilatation. Pancreas: Normal appearance Spleen: Normal appearance Adrenals/Urinary Tract: RIGHT adrenal gland normal appearance. LEFT adrenal nodule 19 x 15 mm previously 16 x 16 mm, indeterminate attenuation and washout. Kidneys, ureters, and bladder normal appearance Stomach/Bowel: Prior gastric sleeve resection. No dilatation or wall thickening of distal esophagus. Residual stomach unremarkable. Slightly prominent stool ball in rectum. Large and small bowel loops otherwise normal appearance. Normal appendix. Vascular/Lymphatic: Atherosclerotic calcifications aorta and iliac arteries without aneurysm. Vascular structures patent. No adenopathy. Reproductive: Unremarkable Other: No free air, free fluid, or abscess. No hernia or acute inflammatory process. Musculoskeletal: Demineralized. IMPRESSION: Prior gastric sleeve resection. Slightly prominent stool ball in rectum. No acute intra-abdominal or intrapelvic abnormalities. Left adrenal nodule 19 x 15 mm previously 16 x 16 mm, of indeterminate attenuation and washout; in the absence of a history of malignancy this is a probably benign lesion and follow-up adrenal CT with and without contrast is recommended in 12 months to assess stability and characterize. Aortic Atherosclerosis (ICD10-I70.0). Electronically Signed   By: Lavonia Dana M.D.   On: 11/19/2020 17:28   DG Chest Port 1 View  Result Date: 11/19/2020 CLINICAL DATA:  Diabetic ketoacidosis. EXAM: PORTABLE CHEST 1 VIEW COMPARISON:  Same day CT abdomen/pelvis 11/19/2020. prior chest radiographs 05/31/2011. FINDINGS: Heart size is within normal limits. Aortic atherosclerosis. No appreciable airspace  consolidation or pulmonary edema. No evidence of pleural effusion or pneumothorax. No acute bony abnormality identified. ACDF hardware. Degenerative changes of the spine. IMPRESSION: No evidence of acute cardiopulmonary abnormality. Aortic Atherosclerosis (ICD10-I70.0). Electronically Signed   By: Kellie Simmering D.O.   On: 11/19/2020 19:44     Labs:   Basic Metabolic Panel: Recent Labs  Lab 11/19/20 1841 11/19/20 2034 11/20/20 0458 11/20/20 1742 11/20/20 2123 11/21/20 0239 11/22/20 0237  NA  --    < > 134* 136 136 136 143  K  --    < > 3.1* 3.9 4.5 2.9* 3.5  CL  --    < > 106 103 105 107 110  CO2  --    < > 16* 17* 18* 17* 22  GLUCOSE  --    < > 121* 174* 119* 87 87  BUN  --    < > '9 8 6 6 8  ' CREATININE  --    < > 0.66 0.73 0.63 0.49 0.55  CALCIUM  --    < > 8.6* 8.8* 8.7* 8.5* 9.1  MG 2.4  --  1.8  --   --  1.9  --   PHOS  --   --  1.3*  --   --  3.0  --    < > = values in this interval not displayed.   GFR Estimated Creatinine Clearance: 86.5 mL/min (by C-G formula based on SCr of 0.55 mg/dL). Liver Function Tests: Recent Labs  Lab 11/19/20 1532 11/20/20 0458  AST 48* 23  ALT 20 12  ALKPHOS 129* 77  BILITOT 2.3* 1.5*  PROT 8.6* 5.9*  ALBUMIN 4.3 3.2*   Recent Labs  Lab 11/19/20 1532  LIPASE 34   No results for input(s): AMMONIA in the last 168 hours. Coagulation profile Recent Labs  Lab 11/19/20 2034  INR 1.1    CBC: Recent Labs  Lab 11/19/20 1532 11/20/20 0458 11/21/20 0239 11/22/20 0237  WBC 14.1* 10.8* 9.2 7.6  NEUTROABS 11.6* 7.4 5.9 3.7  HGB 17.9* 14.0 13.7 13.0  HCT 55.6* 41.9 40.4 38.7  MCV 86.5 84.3 83.1 83.2  PLT 336 220 196 186   Cardiac Enzymes: Recent Labs  Lab 11/19/20 2034  CKTOTAL 99   BNP: Invalid input(s): POCBNP CBG: Recent Labs  Lab 11/21/20 1142 11/21/20 1624 11/21/20 2116 11/22/20 0739 11/22/20 1148  GLUCAP 176* 112* 134* 82 110*   D-Dimer No results for input(s): DDIMER in the last 72 hours. Hgb A1c Recent  Labs    11/20/20 0458  HGBA1C 6.9*   Lipid Profile No results for input(s): CHOL, HDL, LDLCALC, TRIG, CHOLHDL, LDLDIRECT in the last 72 hours. Thyroid function studies No results for input(s): TSH, T4TOTAL, T3FREE, THYROIDAB in the last 72 hours.  Invalid input(s): FREET3 Anemia work up No results for input(s): VITAMINB12, FOLATE, FERRITIN, TIBC, IRON, RETICCTPCT in the last 72 hours. Microbiology Recent Results (from the past 240 hour(s))  Resp Panel by RT-PCR (Flu A&B, Covid) Nasopharyngeal Swab     Status: None   Collection Time: 11/19/20  3:17 PM   Specimen: Nasopharyngeal Swab; Nasopharyngeal(NP) swabs in vial transport medium  Result Value Ref Range Status   SARS Coronavirus 2 by RT PCR NEGATIVE NEGATIVE Final    Comment: (NOTE) SARS-CoV-2 target nucleic acids are NOT DETECTED.  The SARS-CoV-2 RNA is generally detectable in upper respiratory specimens during the acute phase of infection. The lowest concentration of SARS-CoV-2 viral copies this assay can detect is 138 copies/mL. A negative result does not preclude SARS-Cov-2 infection and should not be used as the sole basis for treatment or other patient management decisions. A negative result may occur with  improper specimen collection/handling, submission of specimen other than nasopharyngeal swab, presence of viral mutation(s) within the areas targeted by this assay, and inadequate number of viral copies(<138 copies/mL). A negative result must be combined with clinical observations, patient history, and epidemiological information. The expected result is Negative.  Fact Sheet for Patients:  EntrepreneurPulse.com.au  Fact Sheet for Healthcare Providers:  IncredibleEmployment.be  This test is no t yet approved or cleared by the [place] FDA and  has been authorized for detection and/or diagnosis of SARS-CoV-2 by FDA under an Emergency Use Authorization (EUA). This EUA will  remain  in effect (meaning this test can be used) for the duration of the COVID-19 declaration under Section 564(b)(1) of the Act, 21 U.S.C.section 360bbb-3(b)(1), unless the authorization is terminated  or revoked sooner.       Influenza A by PCR NEGATIVE NEGATIVE Final   Influenza B by PCR NEGATIVE NEGATIVE Final    Comment: (NOTE) The Xpert Xpress SARS-CoV-2/FLU/RSV plus assay is intended as an aid in the diagnosis of influenza from Nasopharyngeal swab specimens and should not be used as a sole basis for treatment. Nasal washings and aspirates are unacceptable for Xpert Xpress SARS-CoV-2/FLU/RSV testing.  Fact Sheet for Patients: EntrepreneurPulse.com.au  Fact Sheet for Healthcare Providers: IncredibleEmployment.be  This test is not yet approved or cleared by the [place] FDA and has been authorized for detection and/or diagnosis of SARS-CoV-2 by FDA under an Emergency Use Authorization (EUA). This EUA will remain in effect (meaning this test can be used) for the duration of the COVID-19 declaration under Section 564(b)(1) of the Act, 21 U.S.C. section 360bbb-3(b)(1), unless the authorization is terminated or revoked.  Performed at [facility], [address] [place],  [place] [address]      Signed: Terrilee Croak  [facility] 11/22/2020, 12:19 PM

## 2020-11-22 NOTE — Progress Notes (Signed)
PHARMACIST - PHYSICIAN COMMUNICATION  DR:   Pietro Cassis  CONCERNING: IV to Oral Route Change Policy  RECOMMENDATION: This patient is receiving pantoprazole by the intravenous route.  Based on criteria approved by the Pharmacy and Therapeutics Committee, the intravenous medication(s) is/are being converted to the equivalent oral dose form(s).   DESCRIPTION: These criteria include: The patient is eating (either orally or via tube) and/or has been taking other orally administered medications for a least 24 hours The patient has no evidence of active gastrointestinal bleeding or impaired GI absorption (gastrectomy, short bowel, patient on TNA or NPO).  If you have questions about this conversion, please contact the Pharmacy Department  '[]'$   [phone] )  [facility] '[]'$   [phone] )  [facility] '[]'$   [phone] )  [facility] '[]'$   [phone] )  [facility] '[x]'$   [phone] )  [place], [facility] 11/22/2020 11:27 AM

## 2020-11-22 NOTE — Progress Notes (Signed)
Inpatient Diabetes Program Recommendations  AACE/ADA: New Consensus Statement on Inpatient Glycemic Control (2015)  Target Ranges:  Prepandial:   less than 140 mg/dL      Peak postprandial:   less than 180 mg/dL (1-2 hours)      Critically ill patients:  140 - 180 mg/dL   Lab Results  Component Value Date   GLUCAP 110 (H) 11/22/2020   HGBA1C 6.9 (H) 11/20/2020    Review of Glycemic Control  Diabetes history: DM2 Outpatient Diabetes medications: Jardiance 25 mg QHS, previously on semaglutide and Januvia Current orders for Inpatient glycemic control: Semglee 10 units QD, Novolog 0-9 units TID with meals and 0-5 HS  Inpatient Diabetes Program Recommendations:   To be d/ced on Lantus 10 units  QD.  Spoke with pt at bedside about going home on insulin. Pt said she had injected insulin  in the past. Reviewed instructions for insulin pen administration and sent video of same. Discussed hypoglycemia s/s and treatment. Instructed pt to check blood sugars 3-4x/day and f/u with Dr. Forde Dandy in the next week or two. Pt appreciative of visit.   Thank you. Lorenda Peck, RD, LDN, CDE Inpatient Diabetes Coordinator [phone]

## 2020-11-26 ENCOUNTER — Telehealth (HOSPITAL_COMMUNITY): Payer: Self-pay | Admitting: *Deleted

## 2020-12-10 ENCOUNTER — Telehealth: Payer: Self-pay | Admitting: Skilled Nursing Facility1

## 2020-12-10 NOTE — Telephone Encounter (Signed)
Returned pts call.   Pt wonders if she can do virtual appt.   Dietitian advised yes.

## 2020-12-12 ENCOUNTER — Ambulatory Visit: Payer: BC Managed Care – PPO | Admitting: Skilled Nursing Facility1

## 2020-12-12 ENCOUNTER — Encounter: Payer: BC Managed Care – PPO | Attending: Surgery | Admitting: Skilled Nursing Facility1

## 2020-12-12 DIAGNOSIS — E669 Obesity, unspecified: Secondary | ICD-10-CM

## 2020-12-12 DIAGNOSIS — E119 Type 2 diabetes mellitus without complications: Secondary | ICD-10-CM | POA: Insufficient documentation

## 2020-12-12 NOTE — Progress Notes (Signed)
Bariatric Nutrition Follow-Up Visit Medical Nutrition Therapy   NUTRITION ASSESSMENT    Anthropometrics   Surgery date: 10/15/2020 Surgery type: sleeve gastrectomy  Start weight at [facility]: 262 pounds Weight today: virtual appt; pt identified by name and DOB; pt agreeable to limitations of this visit type  Clinical  Medical hx: DM, arthritis  Medications: insulin  Labs:  Notable signs/symptoms: knee pain resulting in unsteady gate   Any previous deficiencies? No   Lifestyle & Dietary Hx   Pt states from [place] she had DKA landing her in the H and then ended up with Covid. Pt states she has been feeling weak. Pt states her blood sugars have been about 98-121.    Estimated daily fluid intake: 55-60 oz Estimated daily protein intake: 60+ g Supplements: multi and calcium  Current average weekly physical activity: Too fatigued from Covid    24-Hr Dietary Recall First Meal: protein shake or [place] sausage  Snack:  cheese or pepperoni Second Meal: chicken Snack:   Third Meal: chili or chicken or fish Snack:  Beverages: decaf coffee, decaf tea, water, Gatorade zero, lemonade zero   Post-Op Goals/ Signs/ Symptoms Using straws: no Drinking while eating: no Chewing/swallowing difficulties: no Changes in vision: no Changes to mood/headaches: no Hair loss/changes to skin/nails: no Difficulty focusing/concentrating: no Sweating: no Dizziness/lightheadedness: no Palpitations: no Carbonated/caffeinated beverages: no N/V/D/C/Gas: having a bowel movement every other day; taking stool softener and mirilax daily  Abdominal pain: no Dumping syndrome: no    NUTRITION DIAGNOSIS  Overweight/obesity ([place]-3.3) related to past poor dietary habits and physical inactivity as evidenced by completed bariatric surgery and following dietary guidelines for continued weight loss and healthy nutrition status.     NUTRITION INTERVENTION Nutrition counseling (C-1) and education (E-2) to  facilitate bariatric surgery goals, including: Diet advancement to the next phase (phase 4) now including non starchy vegetables  The importance of consuming adequate calories as well as certain nutrients daily due to the body's need for essential vitamins, minerals, and fats The importance of daily physical activity and to reach a goal of at least 150 minutes of moderate to vigorous physical activity weekly (or as directed by their physician) due to benefits such as increased musculature and improved lab values The importance of intuitive eating specifically learning hunger-satiety cues and understanding the importance of learning a new body: The importance of mindful eating to avoid grazing behaviors  Goals: -Continue to aim for a minimum of 64 fluid ounces 7 days a week with at least 30 ounces being plain water  -Eat non-starchy vegetables 2 times a day 7 days a week  -Start out with soft cooked vegetables today and tomorrow; if tolerated begin to eat raw vegetables or cooked including salads  -Eat your 3 ounces of protein first then start in on your non-starchy vegetables; once you understand how much of your meal leads to satisfaction and not full while still eating 3 ounces of protein and non-starchy vegetables you can eat them in any order   -Continue to aim for 30 minutes of activity at least 5 times a week  -Do NOT cook with/add to your food: alfredo sauce, cheese sauce, barbeque sauce, ketchup, fat back, butter, bacon grease, grease, Crisco, OR SUGAR   Handouts Provided Include  Phase 4 via email  Learning Style & Readiness for Change Teaching method utilized: Visual & Auditory  Demonstrated degree of understanding via: Teach Back  Readiness Level: action Barriers to learning/adherence to lifestyle change: non identified   RD's  Notes for Next Visit Assess adherence to pt chosen goals    MONITORING & EVALUATION Dietary intake, weekly physical activity, body weight  Next  Steps Patient is to follow-up in december

## 2020-12-25 ENCOUNTER — Ambulatory Visit (INDEPENDENT_AMBULATORY_CARE_PROVIDER_SITE_OTHER): Payer: BC Managed Care – PPO | Admitting: Gastroenterology

## 2020-12-25 ENCOUNTER — Encounter: Payer: Self-pay | Admitting: Gastroenterology

## 2020-12-25 VITALS — BP 120/70 | HR 70 | Ht 62.0 in | Wt 227.0 lb

## 2020-12-25 DIAGNOSIS — Z86010 Personal history of colonic polyps: Secondary | ICD-10-CM

## 2020-12-25 NOTE — Patient Instructions (Signed)
If you are age 59 or younger, your body mass index should be between 19-25. Your Body mass index is 41.52 kg/m. If this is out of the aformentioned range listed, please consider follow up with your Primary Care Provider.  _________________________________________________________  The [facility] providers would like to encourage you to use [facility] to communicate with providers for non-urgent requests or questions.  Due to long hold times on the telephone, sending your provider a message by [facility] may be a faster and more efficient way to get a response.  Please allow 48 business hours for a response.  Please remember that this is for non-urgent requests.   You have been scheduled for a colonoscopy. Please follow written instructions given to you at your visit today.  Please pick up your prep supplies at the pharmacy within the next 1-3 days. If you use inhalers (even only as needed), please bring them with you on the day of your procedure.  Due to recent changes in healthcare laws, you may see the results of your imaging and laboratory studies on MyChart before your provider has had a chance to review them.  We understand that in some cases there may be results that are confusing or concerning to you. Not all laboratory results come back in the same time frame and the provider may be waiting for multiple results in order to interpret others.  Please give [place] 48 hours in order for your provider to thoroughly review all the results before contacting the office for clarification of your results.   Thank you for entrusting me with your care and choosing [facility].  Dr Ardis Hughs

## 2020-12-25 NOTE — Progress Notes (Signed)
Review of pertinent gastrointestinal problems: 1. cholecystectomy laparoscopically 06/2011 with Dr. Brantley Stage, this was for cholelithiasis and cholecystitis. IOC was normal. 2. Nausea (persistent after lap chole).  EGD Dr. Ardis Hughs 08/2011 found mild gastritis, neg for h. ppylori on path 3. Tubular adenoma:  Colonoscopy Ardis Hughs 08/2011 found single small polyp, was TA on path, recommended repeat in 5 years.  HPI: This is a very pleasant 60 year old woman   I last saw her about 7 years ago at the time of an office visit.  She was more recently here a little over 4 years ago when she saw Amy Esterwood.  At the time of her 2018 visit she was having some rectal bleeding.  She was recommended to have a repeat colonoscopy.  It does not look like she ever went through with that.   CT scan abdomen pelvis with IV and oral contrast September 2022 indication "32 pound weight loss, feels like food is getting stuck in throat" findings prior gastric sleeve resection.  Slightly prominent stool ball in rectum.  No acute intra-abdominal or intrapelvic abnormalities.  Indeterminate left adrenal nodule.  Blood work September 2022 shows a normal CBC  Upper GI barium examination June 2022 indication "preop versus gastric sleeve surgery.  Morbid obesity."  Findings small sliding-type Sital hernia, mild gastroesophageal reflux.  No other  She really has no changes in her bowels recently.  She does see a minor intermittent rectal bleeding on once a month or every other month basis.  This is never really alarmed her.  She has no serious constipation or diarrhea.  Colon cancer does not run in her family  Review of systems: Pertinent positive and negative review of systems were noted in the above HPI section. All other review negative.   Past Medical History:  Diagnosis Date   Arthritis 24/58/0998   Complication of anesthesia    Diabetes mellitus 04/17/2008   Gallstones 05/31/2011   Numbness in both hands parts of both  hands   all the time since cervical fusion   Numbness in feet    numbness parts of both feet since cervical fusion   Obesity    PONV (postoperative nausea and vomiting)     Past Surgical History:  Procedure Laterality Date    endometria lablation  02-2005   CERVICAL FUSION  04/17/08   CHOLECYSTECTOMY  07/01/2011   Procedure: LAPAROSCOPIC CHOLECYSTECTOMY WITH INTRAOPERATIVE CHOLANGIOGRAM;  Surgeon: Marcello Moores A. Cornett, MD;  Location: [facility];  Service: General;  Laterality: N/A;   HIATAL HERNIA REPAIR N/A 10/15/2020   Procedure: HERNIA REPAIR HIATAL;  Surgeon: Johnathan Hausen, MD;  Location: [facility];  Service: General;  Laterality: N/A;   KNEE SURGERY Left 2014   Torn meniscus   UPPER GI ENDOSCOPY N/A 10/15/2020   Procedure: UPPER GI ENDOSCOPY;  Surgeon: Johnathan Hausen, MD;  Location: [facility];  Service: General;  Laterality: N/A;    Current Outpatient Medications  Medication Sig Dispense Refill   acetaminophen (TYLENOL) 500 MG tablet Take 1,000 mg by mouth every 6 (six) hours as needed (pain).     blood glucose meter kit and supplies Dispense based on patient and insurance preference. Use up to four times daily as directed. (FOR ICD-10 E10.9, E11.9). 1 each 0   cetirizine (ZYRTEC) 10 MG tablet Take 10 mg by mouth at bedtime.     diclofenac Sodium (VOLTAREN) 1 % GEL Apply 2 g topically daily as needed (knee pain).     docusate sodium (COLACE) 100 MG capsule Take 300 mg  by mouth at bedtime.     gabapentin (NEURONTIN) 100 MG capsule Take by mouth.     Insulin Pen Needle (PEN NEEDLES 3/16") 31G X 5 MM MISC To use with Lantus pen. 100 each EACH   montelukast (SINGULAIR) 10 MG tablet Take 10 mg by mouth daily as needed (allergies).     pantoprazole (PROTONIX) 40 MG tablet Take 1 tablet (40 mg total) by mouth daily. (Patient taking differently: Take 40 mg by mouth at bedtime.) 90 tablet 0   PRESCRIPTION MEDICATION Apply 1 application topically 3 (three) times daily as needed (colon spasms). Diltiazem  2% ointment compounded by [facility]     Trospium Chloride 60 MG CP24 Take 60 mg by mouth daily.     celecoxib (CELEBREX) 200 MG capsule Take 200 mg by mouth daily.     insulin glargine (LANTUS) 100 UNIT/ML Solostar Pen Inject 10 Units into the skin daily. 3 mL 0   No current facility-administered medications for this visit.    Allergies as of 12/25/2020 - Review Complete 12/25/2020  Allergen Reaction Noted   Penicillins Hives 05/31/2011    Family History  Problem Relation Age of Onset   Stroke Father    Diabetes Father    Heart disease Father    Colon polyps Mother    Lung cancer Maternal Aunt    Diabetes Maternal Grandfather    Heart disease Maternal Grandfather    Liver cancer Maternal Aunt    Colon cancer Neg Hx     Social History   Socioeconomic History   Marital status: Single    Spouse name: Not on file   Number of children: 0   Years of education: Not on file   Highest education level: Not on file  Occupational History   Occupation: Energy manager: [employer]  Tobacco Use   Smoking status: Never   Smokeless tobacco: Never  Vaping Use   Vaping Use: Never used  Substance and Sexual Activity   Alcohol use: No   Drug use: No   Sexual activity: Not on file  Other Topics Concern   Not on file  Social History Narrative   Not on file   Social Determinants of Health   Financial Resource Strain: Not on file  Food Insecurity: Not on file  Transportation Needs: Not on file  Physical Activity: Not on file  Stress: Not on file  Social Connections: Not on file  Intimate Partner Violence: Not on file     Physical Exam: Ht _0  (1.575 m)   Wt 227 lb (103 kg)   BMI 41.52 kg/m  Constitutional: Morbidly obese Psychiatric: alert and oriented x3 Eyes: extraocular movements intact Mouth: oral pharynx moist, no lesions Neck: supple no lymphadenopathy Cardiovascular: heart regular rate and rhythm Lungs: clear to auscultation  bilaterally Abdomen: soft, nontender, nondistended, no obvious ascites, no peritoneal signs, normal bowel sounds Extremities: no lower extremity edema bilaterally Skin: no lesions on visible extremities  Assessment and plan: 60 y.o. female with personal history of adenomatous colon polyps.  I recommended repeat colonoscopy.  She has morbid obesity and I think it would be safest if we do this in the hospital setting and so we are looking at early December ointments.  I see no reason for any further blood tests or imaging studies prior to then.   Please see the "Patient Instructions" section for addition details about the plan.   Owens Loffler, MD [facility] 12/25/2020, 1:33 PM  Cc: Reynold Bowen, MD  Total time on date of encounter was 40  minutes (this included time spent preparing to see the patient reviewing records; obtaining and/or reviewing separately obtained history; performing a medically appropriate exam and/or evaluation; counseling and educating the patient and family if present; ordering medications, tests or procedures if applicable; and documenting clinical information in the health record).

## 2021-02-13 NOTE — Progress Notes (Signed)
Attempted to obtain medical history via telephone, unable to reach at this time. I left a voicemail to return pre surgical testing department's phone call.  

## 2021-02-24 NOTE — Anesthesia Preprocedure Evaluation (Addendum)
Anesthesia Evaluation  Patient identified by MRN, date of birth, ID band Patient awake    Reviewed: Allergy & Precautions, H&P , NPO status , Patient's Chart, lab work & pertinent test results, Unable to perform ROS - Chart review only  History of Anesthesia Complications (+) PONV and history of anesthetic complications  Airway Mallampati: II  TM Distance: >3 FB Neck ROM: Full    Dental no notable dental hx. (+) Dental Advisory Given   Pulmonary neg pulmonary ROS,    Pulmonary exam normal        Cardiovascular Exercise Tolerance: Good negative cardio ROS Normal cardiovascular exam     Neuro/Psych negative neurological ROS  negative psych ROS   GI/Hepatic negative GI ROS, Neg liver ROS,   Endo/Other  diabetes, Well Controlled, Type 2, Oral Hypoglycemic AgentsMorbid obesity  Renal/GU negative Renal ROS     Musculoskeletal  (+) Arthritis ,   Abdominal (+) + obese,   Peds  Hematology negative hematology ROS (+)   Anesthesia Other Findings   Reproductive/Obstetrics negative OB ROS                            Anesthesia Physical  Anesthesia Plan  ASA: 3  Anesthesia Plan: Epidural   Post-op Pain Management:    Induction:   PONV Risk Score and Plan: 3 and Ondansetron, Dexamethasone, Midazolam and Propofol infusion  Airway Management Planned: Natural Airway  Additional Equipment: None  Intra-op Plan:   Post-operative Plan:   Informed Consent: I have reviewed the patients History and Physical, chart, labs and discussed the procedure including the risks, benefits and alternatives for the proposed anesthesia with the patient or authorized representative who has indicated his/her understanding and acceptance.     Dental advisory given  Plan Discussed with: Anesthesiologist  Anesthesia Plan Comments:        Anesthesia Quick Evaluation

## 2021-02-25 ENCOUNTER — Ambulatory Visit (HOSPITAL_COMMUNITY): Payer: BC Managed Care – PPO | Admitting: Anesthesiology

## 2021-02-25 ENCOUNTER — Encounter (HOSPITAL_COMMUNITY): Payer: Self-pay | Admitting: Gastroenterology

## 2021-02-25 ENCOUNTER — Other Ambulatory Visit: Payer: Self-pay

## 2021-02-25 ENCOUNTER — Ambulatory Visit (HOSPITAL_COMMUNITY)
Admission: RE | Admit: 2021-02-25 | Discharge: 2021-02-25 | Disposition: A | Discharge status: Home / Self Care | Payer: BC Managed Care – PPO | Attending: Gastroenterology | Admitting: Gastroenterology

## 2021-02-25 ENCOUNTER — Encounter (HOSPITAL_COMMUNITY)
Admission: RE | Disposition: A | Discharge status: Home / Self Care | Payer: Self-pay | Source: Home / Self Care | Attending: Gastroenterology

## 2021-02-25 ENCOUNTER — Encounter: Payer: BC Managed Care – PPO | Attending: Surgery | Admitting: Skilled Nursing Facility1

## 2021-02-25 DIAGNOSIS — E119 Type 2 diabetes mellitus without complications: Secondary | ICD-10-CM | POA: Diagnosis not present

## 2021-02-25 DIAGNOSIS — D123 Benign neoplasm of transverse colon: Secondary | ICD-10-CM | POA: Diagnosis not present

## 2021-02-25 DIAGNOSIS — Z6841 Body Mass Index (BMI) 40.0 and over, adult: Secondary | ICD-10-CM | POA: Diagnosis not present

## 2021-02-25 DIAGNOSIS — Z1211 Encounter for screening for malignant neoplasm of colon: Secondary | ICD-10-CM | POA: Insufficient documentation

## 2021-02-25 DIAGNOSIS — Z7984 Long term (current) use of oral hypoglycemic drugs: Secondary | ICD-10-CM | POA: Insufficient documentation

## 2021-02-25 DIAGNOSIS — Z9049 Acquired absence of other specified parts of digestive tract: Secondary | ICD-10-CM | POA: Insufficient documentation

## 2021-02-25 DIAGNOSIS — E669 Obesity, unspecified: Secondary | ICD-10-CM | POA: Insufficient documentation

## 2021-02-25 DIAGNOSIS — Z86010 Personal history of colonic polyps: Secondary | ICD-10-CM | POA: Diagnosis not present

## 2021-02-25 HISTORY — PX: POLYPECTOMY: SHX5525

## 2021-02-25 HISTORY — PX: COLONOSCOPY WITH PROPOFOL: SHX5780

## 2021-02-25 LAB — GLUCOSE, CAPILLARY: Glucose-Capillary: 95 mg/dL (ref 70–99)

## 2021-02-25 SURGERY — COLONOSCOPY WITH PROPOFOL
Anesthesia: Epidural

## 2021-02-25 MED ORDER — LACTATED RINGERS IV SOLN
INTRAVENOUS | Status: AC | PRN
  Administered 2021-02-25: 1000 mL via INTRAVENOUS

## 2021-02-25 MED ORDER — SODIUM CHLORIDE 0.9 % IV SOLN: INTRAVENOUS | Status: DC

## 2021-02-25 MED ORDER — PROPOFOL 1000 MG/100ML IV EMUL
INTRAVENOUS | Status: AC
  Filled 2021-02-25: qty 100

## 2021-02-25 MED ORDER — LACTATED RINGERS IV SOLN: INTRAVENOUS | Status: DC

## 2021-02-25 MED ORDER — PROPOFOL 500 MG/50ML IV EMUL
INTRAVENOUS | Status: DC | PRN
  Administered 2021-02-25: 120 ug/kg/min via INTRAVENOUS

## 2021-02-25 MED ORDER — PROPOFOL 10 MG/ML IV BOLUS
INTRAVENOUS | Status: DC | PRN
  Administered 2021-02-25: 10 mg via INTRAVENOUS
  Administered 2021-02-25 (×2): 20 mg via INTRAVENOUS

## 2021-02-25 SURGICAL SUPPLY — 22 items

## 2021-02-25 NOTE — H&P (Signed)
Review of pertinent gastrointestinal problems: 1. cholecystectomy laparoscopically 06/2011 with Dr. Brantley Stage, this was for cholelithiasis and cholecystitis. IOC was normal. 2. Nausea (persistent after lap chole).  EGD Dr. Ardis Hughs 08/2011 found mild gastritis, neg for h. ppylori on path 3. Tubular adenoma:  Colonoscopy Ardis Hughs 08/2011 found single small polyp, was TA on path, recommended repeat in 5 years.   HPI: This is a woman with h/o polyps  ROS: complete GI ROS as described in HPI, all other review negative.  Constitutional:  No unintentional weight loss   Past Medical History:  Diagnosis Date   Arthritis 29/51/8841   Complication of anesthesia    Diabetes mellitus 04/17/2008   Gallstones 05/31/2011   Numbness in both hands parts of both hands   all the time since cervical fusion   Numbness in feet    numbness parts of both feet since cervical fusion   Obesity    PONV (postoperative nausea and vomiting)     Past Surgical History:  Procedure Laterality Date    endometria lablation  02-2005   CERVICAL FUSION  04/17/08   CHOLECYSTECTOMY  07/01/2011   Procedure: LAPAROSCOPIC CHOLECYSTECTOMY WITH INTRAOPERATIVE CHOLANGIOGRAM;  Surgeon: Marcello Moores A. Cornett, MD;  Location: [facility];  Service: General;  Laterality: N/A;   HIATAL HERNIA REPAIR N/A 10/15/2020   Procedure: HERNIA REPAIR HIATAL;  Surgeon: Johnathan Hausen, MD;  Location: [facility];  Service: General;  Laterality: N/A;   KNEE SURGERY Left 2014   Torn meniscus   UPPER GI ENDOSCOPY N/A 10/15/2020   Procedure: UPPER GI ENDOSCOPY;  Surgeon: Johnathan Hausen, MD;  Location: [facility];  Service: General;  Laterality: N/A;    Current Facility-Administered Medications  Medication Dose Route Frequency Provider Last Rate Last Admin   0.9 %  sodium chloride infusion   Intravenous Continuous Milus Banister, MD       lactated ringers infusion   Intravenous Continuous Milus Banister, MD        Allergies as of 12/25/2020 - Review Complete  12/25/2020  Allergen Reaction Noted   Penicillins Hives 05/31/2011    Family History  Problem Relation Age of Onset   Stroke Father    Diabetes Father    Heart disease Father    Colon polyps Mother    Lung cancer Maternal Aunt    Diabetes Maternal Grandfather    Heart disease Maternal Grandfather    Liver cancer Maternal Aunt    Colon cancer Neg Hx     Social History   Socioeconomic History   Marital status: Single    Spouse name: Not on file   Number of children: 0   Years of education: Not on file   Highest education level: Not on file  Occupational History   Occupation: Energy manager: [employer]  Tobacco Use   Smoking status: Never   Smokeless tobacco: Never  Vaping Use   Vaping Use: Never used  Substance and Sexual Activity   Alcohol use: No   Drug use: No   Sexual activity: Not on file  Other Topics Concern   Not on file  Social History Narrative   Not on file   Social Determinants of Health   Financial Resource Strain: Not on file  Food Insecurity: Not on file  Transportation Needs: Not on file  Physical Activity: Not on file  Stress: Not on file  Social Connections: Not on file  Intimate Partner Violence: Not on file     Physical Exam:  BP (!) 165/78   Pulse 80   Temp (!) 97.5 F (36.4 C) (Oral)   Resp 20   Ht 5\' 2"  (1.575 m)   Wt 103 kg   SpO2 100%   BMI 41.53 kg/m  Constitutional: generally well-appearing Psychiatric: alert and oriented x3 Abdomen: soft, nontender, nondistended, no obvious ascites, no peritoneal signs, normal bowel sounds No peripheral edema noted in lower extremities  Assessment and plan: 60 y.o. female with h/o polyps  Colonoscopy today  Please see the "Patient Instructions" section for addition details about the plan.  Owens Loffler, MD [facility] 02/25/2021, 7:17 AM

## 2021-02-25 NOTE — Discharge Instructions (Signed)

## 2021-02-25 NOTE — Op Note (Signed)
[facility] Patient Name: [name] Procedure Date: 02/25/2021 MRN: [id number] Attending MD: Milus Banister , MD Date of Birth: Jan 21, 1961 CSN: [id number] Age: 60 Admit Type: Outpatient Procedure:                Colonoscopy Indications:              High risk colon cancer surveillance: Personal                            history of colonic polyps; Colonoscopy Ardis Hughs                            08/2011 found single small polyp, was TA on path Providers:                Milus Banister, MD, Doristine Johns, RN, Cherylynn Ridges, Technician, Woodworth Alday CRNA, CRNA Referring MD:              Medicines:                Monitored Anesthesia Care Complications:            No immediate complications. Estimated blood loss:                            None. Estimated Blood Loss:     Estimated blood loss: none. Procedure:                Pre-Anesthesia Assessment:                           - Prior to the procedure, a History and Physical                            was performed, and patient medications and                            allergies were reviewed. The patient's tolerance of                            previous anesthesia was also reviewed. The risks                            and benefits of the procedure and the sedation                            options and risks were discussed with the patient.                            All questions were answered, and informed consent                            was obtained. Prior Anticoagulants: The patient has  taken no previous anticoagulant or antiplatelet                            agents. ASA Grade Assessment: II - A patient with                            mild systemic disease. After reviewing the risks                            and benefits, the patient was deemed in                            satisfactory condition to undergo the procedure.                            After obtaining informed consent, the colonoscope                            was passed under direct vision. Throughout the                            procedure, the patient's blood pressure, pulse, and                            oxygen saturations were monitored continuously. The                            [id number]-HQ190L ([id number] Olympus colonoscope was                            introduced through the anus and advanced to the the                            cecum, identified by appendiceal orifice and                            ileocecal valve. The colonoscopy was performed                            without difficulty. The patient tolerated the                            procedure well. The quality of the bowel                            preparation was good. The ileocecal valve,                            appendiceal orifice, and rectum were photographed. Scope In: 7:38:23 AM Scope Out: 7:53:45 AM Total Procedure Duration: 0 hours 15 minutes 22 seconds  Findings:      A 3 mm polyp was found in the transverse colon. The polyp was sessile.       The polyp was removed with a cold snare. Resection and retrieval were  complete.      The exam was otherwise without abnormality on direct and retroflexion       views. Impression:               - One 3 mm polyp in the transverse colon, removed                            with a cold snare. Resected and retrieved.                           - The examination was otherwise normal on direct                            and retroflexion views. Moderate Sedation:      Not Applicable - Patient had care per Anesthesia. Recommendation:           - Patient has a contact number available for                            emergencies. The signs and symptoms of potential                            delayed complications were discussed with the                            patient. Return to normal activities tomorrow.                            Written  discharge instructions were provided to the                            patient.                           - Resume previous diet.                           - Continue present medications.                           - Await pathology results. Procedure Code(s):        --- Professional ---                           [phone], Colonoscopy, flexible; with removal of                            tumor(s), polyp(s), or other lesion(s) by snare                            technique Diagnosis Code(s):        --- Professional ---                           Z86.010, Personal history of colonic polyps  K63.5, Polyp of colon CPT copyright 2019 American Medical Association. All rights reserved. The codes documented in this report are preliminary and upon coder review may  be revised to meet current compliance requirements. Milus Banister, MD 02/25/2021 7:59:38 AM This report has been signed electronically. Number of Addenda: 0

## 2021-02-25 NOTE — Anesthesia Postprocedure Evaluation (Signed)
Anesthesia Post Note  Patient: [name]  Procedure(s) Performed: COLONOSCOPY WITH PROPOFOL POLYPECTOMY     Patient location during evaluation: PACU Anesthesia Type: MAC Level of consciousness: awake and alert Pain management: pain level controlled Vital Signs Assessment: post-procedure vital signs reviewed and stable Respiratory status: spontaneous breathing and respiratory function stable Cardiovascular status: stable Postop Assessment: no apparent nausea or vomiting Anesthetic complications: no   No notable events documented.  Last Vitals:  Vitals:   02/25/21 0810 02/25/21 0820  BP: (!) 133/98 (!) 147/80  Pulse: 81 78  Resp: (!) 23 17  Temp:    SpO2: 100% 100%    Last Pain:  Vitals:   02/25/21 0820  TempSrc:   PainSc: 0-No pain                 Jurnie Garritano DANIEL

## 2021-02-25 NOTE — Progress Notes (Signed)
Bariatric Nutrition Follow-Up Visit Medical Nutrition Therapy   NUTRITION ASSESSMENT    Anthropometrics   Surgery date: 10/15/2020 Surgery type: sleeve gastrectomy  Start weight at [facility]: 262 pounds Weight today: virtual appt; pt identified by name and DOB; pt agreeable to limitations of this visit type  Clinical  Medical hx: DM, arthritis  Medications: insulin once a day Labs:  Notable signs/symptoms: knee pain resulting in unsteady gate   Any previous deficiencies? No   Lifestyle & Dietary Hx   Pt states she has been feeling better with blood sugars around 95-120. Pt states she gets tired easily stating it was from covid.  Pt states tries to get in non starchy vegetables 2 times a day 7 days a week landing at least once.   Estimated daily fluid intake: 55-60 oz Estimated daily protein intake: 60+ g Supplements: multi and calcium  Current average weekly physical activity: 3 days a week inconsistent 20-30 minutes  24-Hr Dietary Recall First Meal: yogurt or egg + [place] sausage Snack:  cheese or pepperoni Second Meal: chicken + beets or broccoli or carrots Snack:  pork rinds Third Meal: shrimp or hamburger or roast and carrots or chicken salad Snack:  Beverages: decaf coffee, decaf tea, water, Gatorade zero, lemonade zero   Post-Op Goals/ Signs/ Symptoms Using straws: no Drinking while eating: no Chewing/swallowing difficulties: no Changes in vision: no Changes to mood/headaches: no Hair loss/changes to skin/nails: no Difficulty focusing/concentrating: no Sweating: no Dizziness/lightheadedness: no Palpitations: no Carbonated/caffeinated beverages: no N/V/D/C/Gas: having a bowel movement every other day; taking stool softener every night Abdominal pain: no Dumping syndrome: no    NUTRITION DIAGNOSIS  Overweight/obesity ([place]-3.3) related to past poor dietary habits and physical inactivity as evidenced by completed bariatric surgery and following dietary guidelines  for continued weight loss and healthy nutrition status.     NUTRITION INTERVENTION Nutrition counseling (C-1) and education (E-2) to facilitate bariatric surgery goals, including: Diet advancement to the next phase (phase 5) now including starchy vegetables  The importance of consuming adequate calories as well as certain nutrients daily due to the body's need for essential vitamins, minerals, and fats The importance of daily physical activity and to reach a goal of at least 150 minutes of moderate to vigorous physical activity weekly (or as directed by their physician) due to benefits such as increased musculature and improved lab values The importance of intuitive eating specifically learning hunger-satiety cues and understanding the importance of learning a new body: The importance of mindful eating to avoid grazing behaviors   Goals: Increase your physical activity with consistency: try doing YouTube videos when it is dark outside   Handouts Provided Include  Phase 5 via email  Learning [place] for Change Teaching method utilized: Visual & Auditory  Demonstrated degree of understanding via: Teach Back  Readiness Level: action Barriers to learning/adherence to lifestyle change: non identified   RD's Notes for Next Visit Assess adherence to pt chosen goals    MONITORING & EVALUATION Dietary intake, weekly physical activity, body weight  Next Steps Patient is to follow-up in April

## 2021-02-25 NOTE — Transfer of Care (Signed)
Immediate Anesthesia Transfer of Care Note  Patient: [name]  Procedure(s) Performed: COLONOSCOPY WITH PROPOFOL POLYPECTOMY  Patient Location: PACU  Anesthesia Type:MAC  Level of Consciousness: sedated  Airway & Oxygen Therapy: Patient Spontanous Breathing and Patient connected to face mask oxygen  Post-op Assessment: Report given to RN and Post -op Vital signs reviewed and stable  Post vital signs: Reviewed and stable  Last Vitals:  Vitals Value Taken Time  BP    Temp    Pulse    Resp    SpO2      Last Pain:  Vitals:   02/25/21 0644  TempSrc: Oral  PainSc: 0-No pain         Complications: No notable events documented.

## 2021-02-26 ENCOUNTER — Encounter (HOSPITAL_COMMUNITY): Payer: Self-pay | Admitting: Gastroenterology

## 2021-02-26 LAB — SURGICAL PATHOLOGY

## 2021-03-27 ENCOUNTER — Other Ambulatory Visit: Payer: Self-pay | Admitting: Endocrinology

## 2021-03-27 DIAGNOSIS — Z1231 Encounter for screening mammogram for malignant neoplasm of breast: Secondary | ICD-10-CM

## 2021-04-24 ENCOUNTER — Ambulatory Visit: Payer: BC Managed Care – PPO

## 2021-04-25 ENCOUNTER — Ambulatory Visit
Admission: RE | Admit: 2021-04-25 | Discharge: 2021-04-25 | Disposition: A | Discharge status: Home / Self Care | Payer: BC Managed Care – PPO | Source: Ambulatory Visit | Attending: Endocrinology | Admitting: Endocrinology

## 2021-04-25 ENCOUNTER — Other Ambulatory Visit: Payer: Self-pay

## 2021-04-25 DIAGNOSIS — Z1231 Encounter for screening mammogram for malignant neoplasm of breast: Secondary | ICD-10-CM

## 2021-07-01 ENCOUNTER — Encounter: Payer: BC Managed Care – PPO | Attending: Surgery | Admitting: Skilled Nursing Facility1

## 2021-07-01 DIAGNOSIS — Z713 Dietary counseling and surveillance: Secondary | ICD-10-CM | POA: Insufficient documentation

## 2021-07-01 DIAGNOSIS — Z6841 Body Mass Index (BMI) 40.0 and over, adult: Secondary | ICD-10-CM | POA: Diagnosis not present

## 2021-07-01 DIAGNOSIS — E669 Obesity, unspecified: Secondary | ICD-10-CM

## 2021-07-01 NOTE — Progress Notes (Signed)
Bariatric Nutrition Follow-Up Visit ?Medical Nutrition Therapy  ? ?NUTRITION ASSESSMENT ? ?Anthropometrics  ? ?Surgery date: 10/15/2020 ?Surgery type: sleeve gastrectomy  ?Start weight at [facility]: 262 pounds ?Weight today: virtual appt; pt identified by name and DOB; pt agreeable to limitations of this visit type ? ?Clinical  ?Medical hx: DM, arthritis  ?Medications: no longer taking insulin ?Labs:  ?Notable signs/symptoms: knee pain resulting in unsteady gate   ?Any previous deficiencies? No ?  ?Lifestyle & Dietary Hx ?Pt states she is no longer taking insulin ?Pt states her blood sugars have been about 105, 108, 95.  ?Pt states her A1C is 5.5 ? 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 ?24-Hr Dietary Recall ?First Meal: yogurt or egg + [place] sausage ?Snack:  pork skins ?Second Meal: chicken + beets or broccoli or carrots ?Snack:  raw veggies sometimes with lite sour cream ranch dip ?Third Meal: shrimp or hamburger or roast and carrots or chicken salad ?Snack: sometimes pork skins or almonds ?Beverages: decaf coffee, decaf tea, water, Gatorade zero, lemonade zero  ? ?Post-Op Goals/ Signs/ Symptoms ?Using straws: no ?Drinking while eating: no ?Chewing/swallowing difficulties: no ?Changes in vision: no ?Changes to mood/headaches: no ?Hair loss/changes to skin/nails: no ?Difficulty focusing/concentrating: no ?Sweating: no ?Dizziness/lightheadedness: no ?Palpitations: no ?Carbonated/caffeinated beverages: no ?N/V/D/C/Gas: having a bowel movement every other day; taking stool softener every night ?Abdominal pain: no ?Dumping syndrome: no ? ?  ?NUTRITION DIAGNOSIS  ?Overweight/obesity ([place]-3.3) related to past poor dietary habits and physical inactivity as evidenced by completed bariatric surgery and following  dietary guidelines for continued weight loss and healthy nutrition status. ?  ?  ?NUTRITION INTERVENTION ?Nutrition counseling (C-1) and education (E-2) to facilitate bariatric surgery goals, including: ?The importance of consuming adequate calories as well as certain nutrients daily due to the body's need for essential vitamins, minerals, and fats ?The importance of daily physical activity and to reach a goal of at least 150 minutes of moderate to vigorous physical activity weekly (or as directed by their physician) due to benefits such as increased musculature and improved lab values ?The importance of intuitive eating specifically learning hunger-satiety cues and understanding the importance of learning a new body: The importance of mindful eating to avoid grazing behaviors  ?Why you need complex carbohydrates: Whole grains and other complex carbohydrates are required to have a healthy diet. Whole grains provide fiber which can help with blood glucose levels and help keep you satiated. Fruits and starchy vegetables provide essential vitamins and minerals required for immune function, eyesight support, brain support, bone density, wound healing and many other functions within the body. According to the current evidenced based 2020-2025 Dietary Guidelines for Americans, complex carbohydrates are part of a healthy eating pattern which is associated with a decreased risk for type 2 diabetes, cancers, and cardiovascular disease.  ?Purpose of hydration: Water makes up over 50% of your total body water, and is part of many organs throughout the body. Water is essential to transport digested nutrients, regulate body temperature, rid the body of waste products, and protects joints and the spinal cord. When not properly hydrated you will begin to experience headaches, cramps and dizziness. Further dehydration can result in rapid heart rate, shock, oliguria, and may cause seizures.   ?https://www.merckmanuals.com/home/hormonal-and-metabolic-disordehttps://www.usgs.gov/special-topic/water-science-school/science/water-you-water-and-human-body?qt-science_center_objects=0#qt-science_center_objectsrs/water-balance/about-body-water ?HistoricalGrowth.gl ?https://www.stevens.org/ ?PimpTShirt.fi ?https://www.health.InvestmentBrowse.at ? ?Goals: ?-Increase your  physical activity with consistency: try doing YouTube videos when it is dark outside ?-Resistance bands 2-3 days a week for muscle maintenance and bone health ?-Add 2 complex carbohydrate servings a day. ? ?Handouts Provided Include  ?Phase 6 via email ? ?Learning [place] for Change ?Teaching method utilized: Visual & Auditory  ?Demonstrated degree of understanding via: Teach Back  ?Readiness Level: action ?Barriers to learning/adherence to lifestyle change: non identified  ? ?RD's Notes for Next Visit ?Assess adherence to pt chosen goals  ? ? ?MONITORING & EVALUATION ?Dietary intake, weekly physical activity, body weight ? ?Next Steps ?Patient is to follow-up in August for 1 year follow-up. ? ?

## 2022-04-15 ENCOUNTER — Other Ambulatory Visit: Payer: Self-pay | Admitting: Endocrinology

## 2022-04-15 DIAGNOSIS — Z1231 Encounter for screening mammogram for malignant neoplasm of breast: Secondary | ICD-10-CM

## 2022-05-22 ENCOUNTER — Ambulatory Visit
Admission: RE | Admit: 2022-05-22 | Discharge: 2022-05-22 | Disposition: A | Discharge status: Home / Self Care | Payer: BC Managed Care – PPO | Source: Ambulatory Visit | Attending: Endocrinology | Admitting: Endocrinology

## 2022-05-22 DIAGNOSIS — Z1231 Encounter for screening mammogram for malignant neoplasm of breast: Secondary | ICD-10-CM

## 2022-10-14 IMAGING — MG MM DIGITAL SCREENING BILAT W/ TOMO AND CAD
6 of 10 series · 6 of 30 positions shown · non-contrast
Comparison: Previous exam(s).

CLINICAL DATA: Screening.

EXAM:
DIGITAL SCREENING BILATERAL MAMMOGRAM WITH TOMOSYNTHESIS AND CAD
TECHNIQUE: Bilateral screening digital craniocaudal and mediolateral oblique
mammograms were obtained. Bilateral screening digital breast
tomosynthesis was performed. The images were evaluated with
computer-aided detection.

[L CC synth-2D]
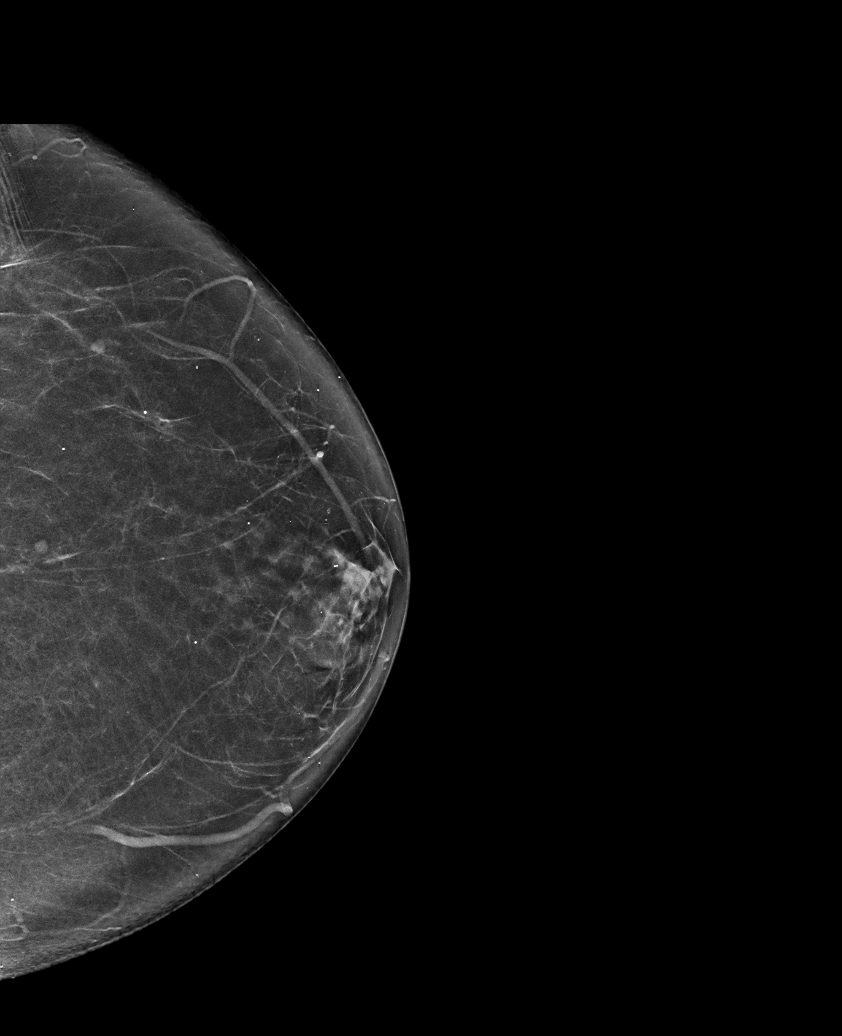

[R MLO synth-2D (1 of 2)]
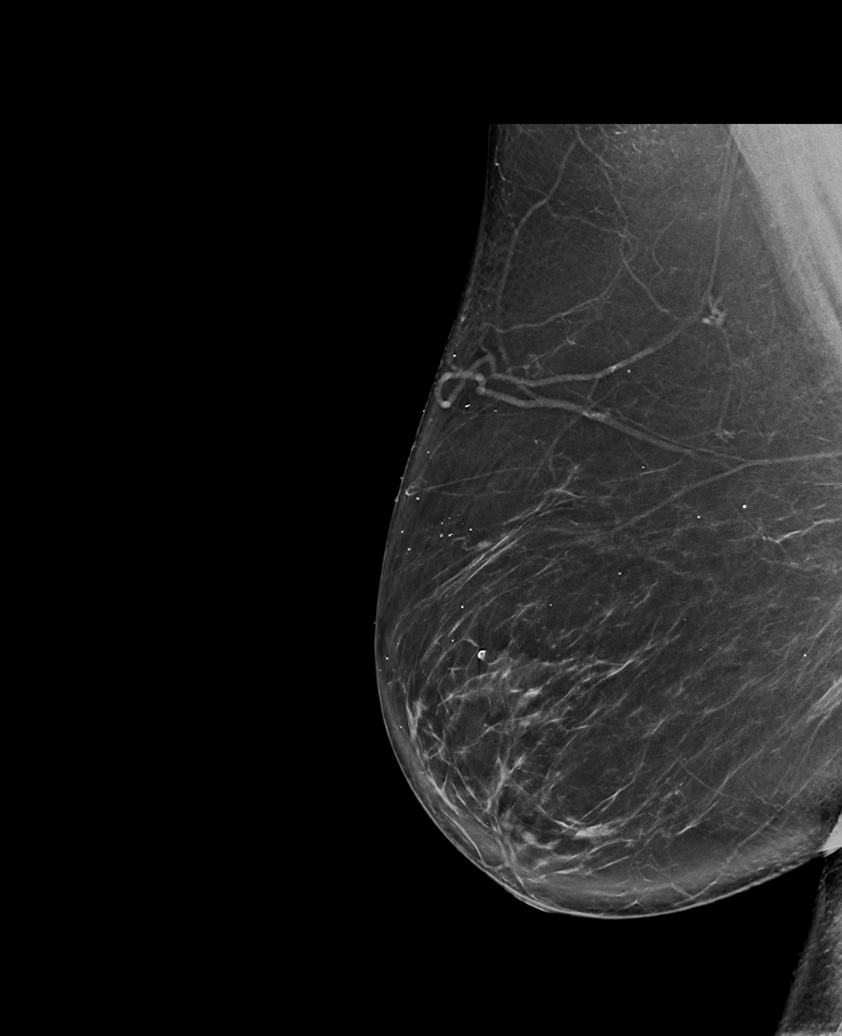

[R MLO synth-2D (2 of 2)]
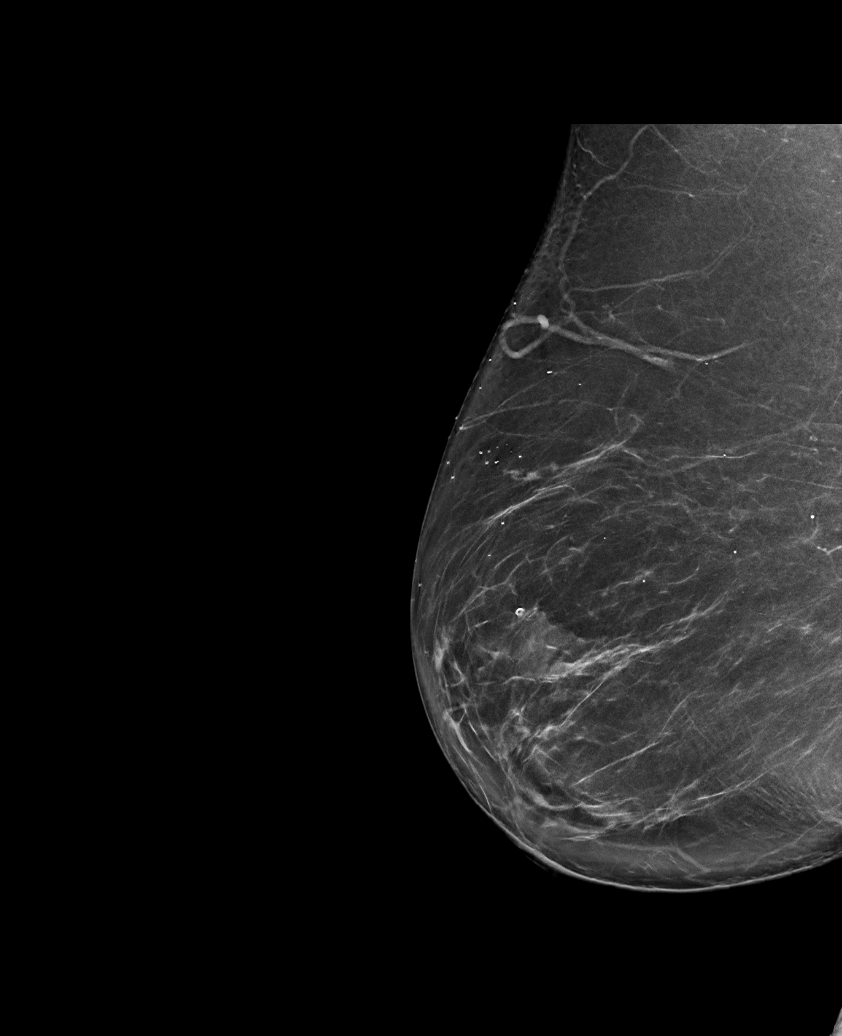

[L MLO synth-2D]
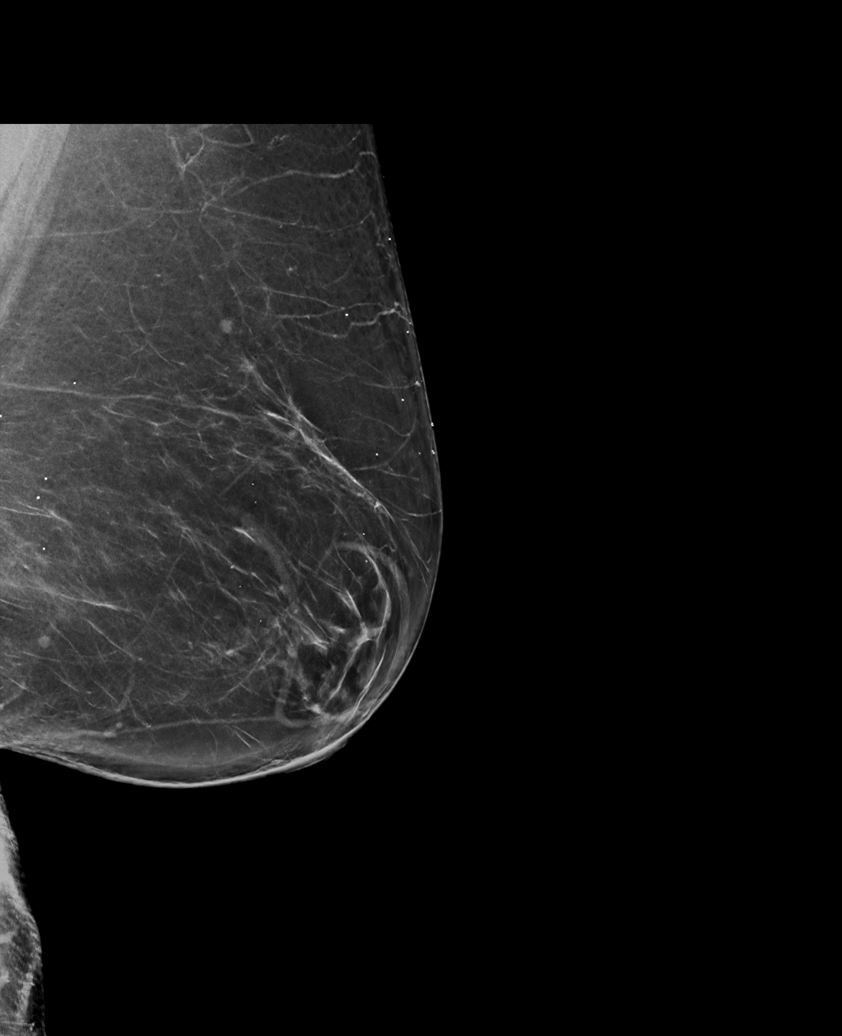

[R CC synth-2D]
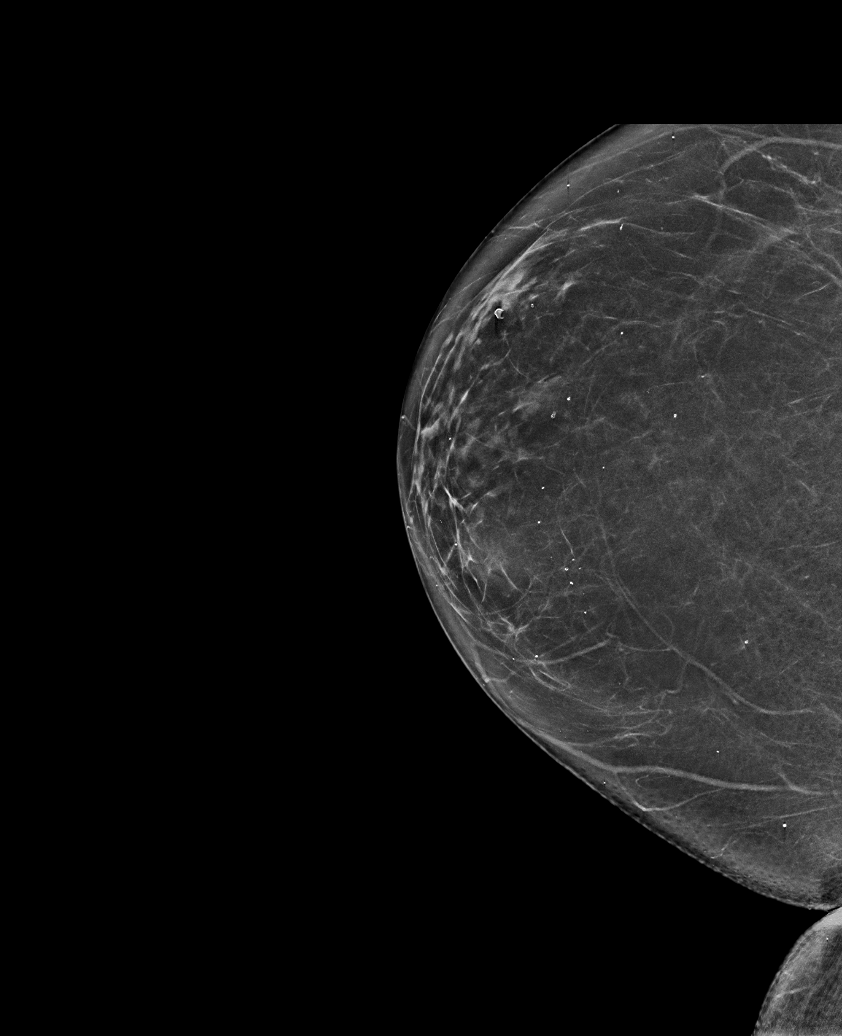

[R MLO tomo · tomo slice 45/89.0]
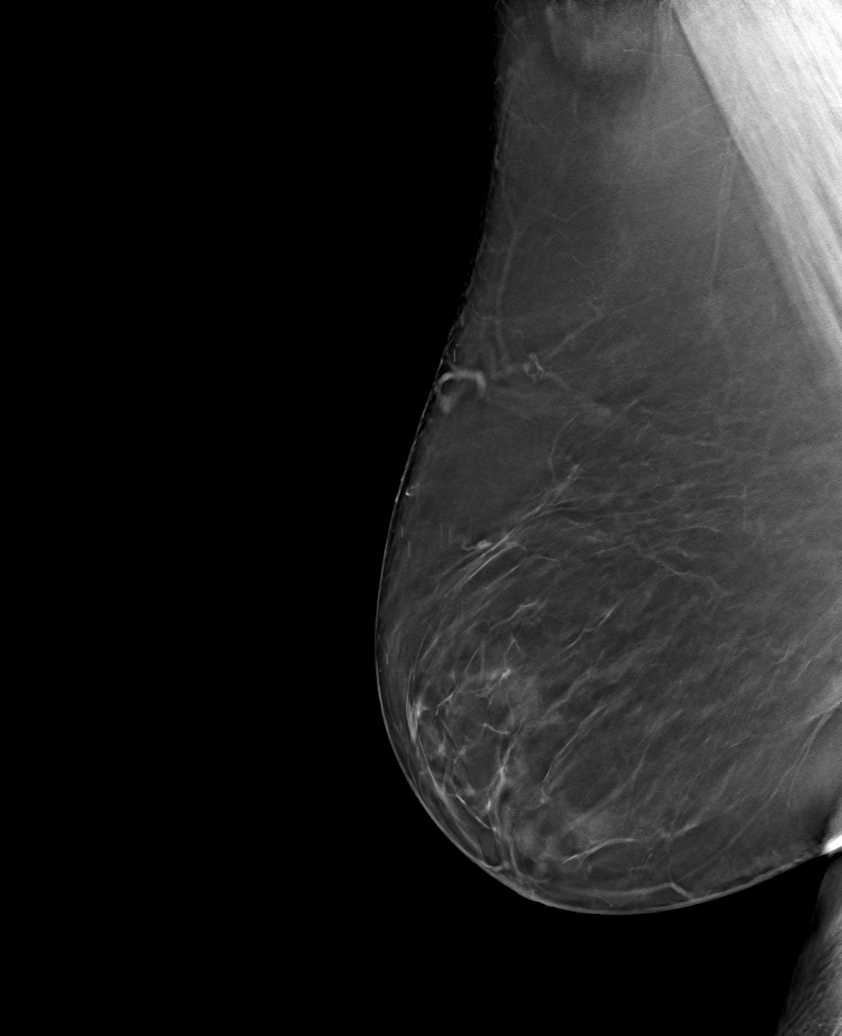

[6 of 30 positions shown; findings below may reference images not displayed]

ACR Breast Density Category b: There are scattered areas of
fibroglandular density.
FINDINGS: There are no findings suspicious for malignancy.
IMPRESSION: No mammographic evidence of malignancy. A result letter of this
screening mammogram will be mailed directly to the patient.

RECOMMENDATION:
Screening mammogram in one year. (Code:51-O-LD2)

BI-RADS CATEGORY  1: Negative.

## 2022-12-07 DIAGNOSIS — E119 Type 2 diabetes mellitus without complications: Secondary | ICD-10-CM | POA: Diagnosis not present

## 2022-12-07 DIAGNOSIS — N201 Calculus of ureter: Secondary | ICD-10-CM | POA: Insufficient documentation

## 2022-12-07 DIAGNOSIS — R109 Unspecified abdominal pain: Secondary | ICD-10-CM | POA: Diagnosis present

## 2022-12-07 DIAGNOSIS — Z794 Long term (current) use of insulin: Secondary | ICD-10-CM | POA: Diagnosis not present

## 2022-12-08 ENCOUNTER — Encounter (HOSPITAL_COMMUNITY): Payer: Self-pay | Admitting: *Deleted

## 2022-12-08 ENCOUNTER — Emergency Department (HOSPITAL_COMMUNITY): Payer: BC Managed Care – PPO

## 2022-12-08 ENCOUNTER — Emergency Department (HOSPITAL_COMMUNITY)
Admission: EM | Admit: 2022-12-08 | Discharge: 2022-12-08 | Disposition: A | Discharge status: Home / Self Care | Payer: BC Managed Care – PPO | Attending: Emergency Medicine | Admitting: Emergency Medicine

## 2022-12-08 ENCOUNTER — Other Ambulatory Visit: Payer: Self-pay

## 2022-12-08 DIAGNOSIS — N201 Calculus of ureter: Secondary | ICD-10-CM

## 2022-12-08 LAB — URINALYSIS, ROUTINE W REFLEX MICROSCOPIC
Bilirubin Urine: NEGATIVE
Glucose, UA: NEGATIVE mg/dL
Ketones, ur: 20 mg/dL — AB
Nitrite: NEGATIVE
Protein, ur: NEGATIVE mg/dL
RBC / HPF: >50 RBC/hpf (ref 0–5)
Specific Gravity, Urine: >1.046 — ABNORMAL HIGH (ref 1.005–1.030)
pH: 5 (ref 5.0–8.0)

## 2022-12-08 LAB — CBC
HCT: 45.4 % (ref 36.0–46.0)
Hemoglobin: 15.1 g/dL — ABNORMAL HIGH (ref 12.0–15.0)
MCH: 29.3 pg (ref 26.0–34.0)
MCHC: 33.3 g/dL (ref 30.0–36.0)
MCV: 88.2 fL (ref 80.0–100.0)
Platelets: 227 10*3/uL (ref 150–400)
RBC: 5.15 MIL/uL — ABNORMAL HIGH (ref 3.87–5.11)
RDW: 12.3 % (ref 11.5–15.5)
WBC: 14.4 10*3/uL — ABNORMAL HIGH (ref 4.0–10.5)
nRBC: 0 % (ref 0.0–0.2)

## 2022-12-08 LAB — COMPREHENSIVE METABOLIC PANEL
ALT: 19 U/L (ref 0–44)
AST: 35 U/L (ref 15–41)
Albumin: 3.7 g/dL (ref 3.5–5.0)
Alkaline Phosphatase: 69 U/L (ref 38–126)
Anion gap: 12 (ref 5–15)
BUN: 17 mg/dL (ref 8–23)
CO2: 23 mmol/L (ref 22–32)
Calcium: 9.2 mg/dL (ref 8.9–10.3)
Chloride: 102 mmol/L (ref 98–111)
Creatinine, Ser: 0.66 mg/dL (ref 0.44–1.00)
GFR, Estimated: >60 mL/min (ref >60)
Glucose, Bld: 173 mg/dL — ABNORMAL HIGH (ref 70–99)
Potassium: 3.6 mmol/L (ref 3.5–5.1)
Sodium: 137 mmol/L (ref 135–145)
Total Bilirubin: 1.7 mg/dL — ABNORMAL HIGH (ref 0.3–1.2)
Total Protein: 6.4 g/dL — ABNORMAL LOW (ref 6.5–8.1)

## 2022-12-08 LAB — LIPASE, BLOOD: Lipase: 27 U/L (ref 11–51)

## 2022-12-08 MED ORDER — OXYCODONE-ACETAMINOPHEN 5-325 MG PO TABS
1.0000 | ORAL_TABLET | Freq: Once | ORAL | Status: AC
  Administered 2022-12-08: 1 via ORAL
  Filled 2022-12-08: qty 1

## 2022-12-08 MED ORDER — IOHEXOL 350 MG/ML SOLN
75.0000 mL | Freq: Once | INTRAVENOUS | Status: AC | PRN
  Administered 2022-12-08: 75 mL via INTRAVENOUS

## 2022-12-08 MED ORDER — TAMSULOSIN HCL 0.4 MG PO CAPS: 0.4000 mg | ORAL_CAPSULE | Freq: Every day | ORAL | 0 refills | Status: AC

## 2022-12-08 MED ORDER — SODIUM CHLORIDE 0.9 % IV BOLUS
1000.0000 mL | Freq: Once | INTRAVENOUS | Status: AC
  Administered 2022-12-08: 1000 mL via INTRAVENOUS

## 2022-12-08 MED ORDER — HYDROCODONE-ACETAMINOPHEN 5-325 MG PO TABS
1.0000 | ORAL_TABLET | ORAL | 0 refills | Status: DC | PRN
Start: 2022-12-08 — End: 2022-12-19

## 2022-12-08 MED ORDER — ONDANSETRON 4 MG PO TBDP
8.0000 mg | ORAL_TABLET | Freq: Once | ORAL | Status: AC
  Administered 2022-12-08: 8 mg via ORAL
  Filled 2022-12-08: qty 2

## 2022-12-08 MED ORDER — KETOROLAC TROMETHAMINE 15 MG/ML IJ SOLN
15.0000 mg | Freq: Once | INTRAMUSCULAR | Status: DC
  Filled 2022-12-08: qty 1

## 2022-12-08 NOTE — ED Triage Notes (Signed)
The pt is c/o lt flank pain since this am  nausea and vomiting also

## 2022-12-08 NOTE — ED Notes (Signed)
[address] at this time. Assumed care of patient.

## 2022-12-08 NOTE — Discharge Instructions (Addendum)
Evaluation revealed 2 kidney stones in the proximal left ureter that are causing some obstruction to your left kidney.  This is likely the source of your symptoms.  Advised that you follow-up with urology.  If you develop a fever, nausea vomiting diarrhea, painful urination or any other concerning symptom please return emerged part further evaluation.  Sent Flomax to your pharmacy.  Please take that daily.  Also sent Norco to your pharmacy as well for acute pain.  You can also take ibuprofen.

## 2022-12-08 NOTE — ED Provider Notes (Signed)
[place] EMERGENCY DEPARTMENT AT [facility] Provider Note   CSN: [id number] Arrival date & time: 12/07/22  2349     History  Chief Complaint  Patient presents with   Flank Pain   HPI [name] is a 62 y.o. female with diabetes, GERD presenting for flank pain.  Started about a week ago.  Located in the left flank and radiating to the left mid abdomen. Pain was notably much worse over the last 24 hours.  Now with nausea and vomiting.  No blood in the vomit.  Had a normal bowel movement this morning.  Denies urinary symptoms.  Denies fever at home.  States she was given some nausea and pain medicine in triage and her symptoms have improved significantly.  Denies chest pain and shortness of breath.   Flank Pain       Home Medications Prior to Admission medications   Medication Sig Start Date End Date Taking? Authorizing Provider  HYDROcodone-acetaminophen (NORCO/VICODIN) 5-325 MG tablet Take 1 tablet by mouth every 4 (four) hours as needed. 12/08/22  Yes Gareth Eagle, PA-C  tamsulosin (FLOMAX) 0.4 MG CAPS capsule Take 1 capsule (0.4 mg total) by mouth daily. 12/08/22 01/07/23 Yes Gareth Eagle, PA-C  acetaminophen (TYLENOL) 500 MG tablet Take 1,000 mg by mouth every 6 (six) hours as needed (pain).    [provider]  blood glucose meter kit and supplies Dispense based on patient and insurance preference. Use up to four times daily as directed. (FOR ICD-10 E10.9, E11.9). 11/22/20   Lorin Glass, MD  Calcium Carbonate-Vitamin D ([employer] CALCIUM/VIT D SOFT CHEWS PO) Take 2 each by mouth daily.    [provider]  celecoxib (CELEBREX) 200 MG capsule Take 200 mg by mouth daily. 12/10/20   [provider]  diclofenac Sodium (VOLTAREN) 1 % GEL Apply 2 g topically daily as needed (knee pain).    [provider]  docusate sodium (COLACE) 100 MG capsule Take 300 mg by mouth at bedtime.    [provider]  DULoxetine (CYMBALTA) 60 MG  capsule Take 60 mg by mouth daily.    [provider]  fexofenadine (ALLEGRA) 180 MG tablet Take 180 mg by mouth daily.    [provider]  gabapentin (NEURONTIN) 100 MG capsule Take 100 mg by mouth daily. 12/24/20   [provider]  Insulin Glargine (BASAGLAR KWIKPEN [place]) Inject 10 Units into the skin daily.    [provider]  Insulin Pen Needle (PEN NEEDLES 3/16") 31G X 5 MM MISC To use with Lantus pen. 11/22/20   Dahal, Melina Schools, MD  montelukast (SINGULAIR) 10 MG tablet Take 10 mg by mouth daily as needed (allergies). 10/02/20   [provider]  Multiple Vitamins-Minerals (BARIATRIC MULTIVITAMINS/IRON PO) Take 3 tablets by mouth daily.    [provider]  pantoprazole (PROTONIX) 40 MG tablet Take 1 tablet (40 mg total) by mouth daily. 10/16/20   Luretha Murphy, MD  Probiotic Product (PROBIOTIC DAILY PO) Take 1 capsule by mouth at bedtime.    [provider]      Allergies    Jardiance [empagliflozin] and Penicillins    Review of Systems   Review of Systems  Genitourinary:  Positive for flank pain.    Physical Exam   Vitals:   12/08/22 0029 12/08/22 0919  BP: 136/75 (!) 145/77  Pulse: 83 75  Resp: 18 17  Temp: 98 F (36.7 C) 97.6 F (36.4 C)  SpO2: 98% 100%  CONSTITUTIONAL:  well-appearing, NAD NEURO:  GCS 15. Speech is goal oriented. No deficits appreciated to CN III-XII; symmetric eyebrow raise, no facial drooping, tongue midline. Patient has equal grip strength bilaterally with 5/5 strength against resistance in all major muscle groups bilaterally. Sensation to light touch intact. Patient moves extremities without ataxia. Normal finger-nose-finger. Patient ambulatory with steady gait. EYES:  eyes equal and reactive ENT/NECK:  Supple, no stridor  CARDIO: Regular rate and rhythm, appears well-perfused  PULM:  No respiratory distress, CTAB GI/GU:  non-distended, soft, left CVA tenderness MSK/SPINE:  No gross  deformities, no edema, moves all extremities  SKIN:  no rash, atraumatic  *Additional and/or pertinent findings included in MDM below  ED Results / Procedures / Treatments   Labs (all labs ordered are listed, but only abnormal results are displayed) Labs Reviewed  COMPREHENSIVE METABOLIC PANEL - Abnormal; Notable for the following components:      Result Value   Glucose, Bld 173 (*)    Total Protein 6.4 (*)    Total Bilirubin 1.7 (*)    All other components within normal limits  CBC - Abnormal; Notable for the following components:   WBC 14.4 (*)    RBC 5.15 (*)    Hemoglobin 15.1 (*)    All other components within normal limits  URINALYSIS, ROUTINE W REFLEX MICROSCOPIC - Abnormal; Notable for the following components:   APPearance HAZY (*)    Specific Gravity, Urine >1.046 (*)    Hgb urine dipstick LARGE (*)    Ketones, ur 20 (*)    Leukocytes,Ua TRACE (*)    Bacteria, UA RARE (*)    All other components within normal limits  LIPASE, BLOOD    EKG None  Radiology CT ABDOMEN PELVIS W CONTRAST  Result Date: 12/08/2022 CLINICAL DATA:  Abdominal pain, acute, nonlocalized. EXAM: CT ABDOMEN AND PELVIS WITH CONTRAST TECHNIQUE: Multidetector CT imaging of the abdomen and pelvis was performed using the standard protocol following bolus administration of intravenous contrast. RADIATION DOSE REDUCTION: This exam was performed according to the departmental dose-optimization program which includes automated exposure control, adjustment of the mA and/or kV according to patient size and/or use of iterative reconstruction technique. CONTRAST:  75mL OMNIPAQUE IOHEXOL 350 MG/ML SOLN COMPARISON:  11/19/2020 FINDINGS: Lower chest: Lung bases are clear. Hepatobiliary: Chronic focal fatty change adjacent to the falciform ligament. Previous cholecystectomy. Pancreas: Normal Spleen: Normal Adrenals/Urinary Tract: Chronic low-density adrenal adenoma on the left is unchanged from prior studies. No  follow-up recommended given stability since 2012. Small nonobstructing stone in the lower pole the right kidney. Left kidney shows hydroureteronephrosis with 2 adjacent stones impacted in the proximal ureter at the level of the lower pole of the kidney. The larger of these 2 stones measures 6 mm in size. The smaller measures 3 mm in size. No stone distal to that. No stone in the bladder. Stomach/Bowel: Stomach and small intestine are normal. Vascular/Lymphatic: Aortic atherosclerosis. No aneurysm. IVC is normal. No adenopathy. Reproductive: No pelvic mass. Other: No free fluid or air. Musculoskeletal: Chronic degenerative changes affecting the lower thoracic and lumbar spine. IMPRESSION: 1. Left-sided hydroureteronephrosis due to 2 adjacent stones impacted in the proximal ureter at the level of the lower pole of the kidney. The larger of these 2 stones measures 6 mm in size. The smaller measures 3 mm in size. 2. Small nonobstructing stone in the lower pole the right kidney. 3. Chronic low-density adrenal adenoma on the left is unchanged from prior studies. No follow-up recommended given  stability since 2012. 4. Aortic atherosclerosis. 5. 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 Aortic Atherosclerosis (ICD10-I70.0). Electronically Signed   By: Paulina Fusi M.D.   On: 12/08/2022 11:43    Procedures Procedures    Medications Ordered in ED Medications  ketorolac (TORADOL) 15 MG/ML injection 15 mg (has no administration in time range)  oxyCODONE-acetaminophen (PERCOCET/ROXICET) 5-325 MG per tablet 1 tablet (1 tablet Oral Given 12/08/22 0145)  ondansetron (ZOFRAN-ODT) disintegrating tablet 8 mg (8 mg Oral Given 12/08/22 0148)  sodium chloride 0.9 % bolus 1,000 mL (0 mLs Intravenous Stopped 12/08/22 1142)  iohexol (OMNIPAQUE) 350 MG/ML injection 75 mL (75 mLs Intravenous Contrast Given 12/08/22 1002)    ED Course/ Medical Decision Making/ A&P Clinical Course as of 12/08/22  1336  Mon Dec 08, 2022  0724 Platelets: 227 [JR]    Clinical Course User Index [JR] Gareth Eagle, PA-C                                 Medical Decision Making Amount and/or Complexity of Data Reviewed Labs:  Decision-making details documented in ED Course. Radiology: ordered.  Risk Prescription drug management.   Initial Impression and Ddx 62 year old well-appearing female presenting for left flank pain.  Exam notable for mild left CVA tenderness.  DDx includes nephrolithiasis, pyelonephritis, diverticulitis, other intra-abdominal infection, rib fracture, ACS, pneumonia. Patient PMH that increases complexity of ED encounter:  diabetes, GERD   Interpretation of Diagnostics - I independent reviewed and interpreted the labs as followed: Leukocytosis, hematuria, pyuria  - I independently visualized the following imaging with scope of interpretation limited to determining acute life threatening conditions related to emergency care: CT ab/pelvis, which revealed left-sided hydronephrosis with 2 stones in the proximal ureter  Patient Reassessment and Ultimate Disposition/Management Patient remained asymptomatic without vomiting and abdominal pain or flank tenderness on serial reassessments.  CT findings suggestive of 2 stones in the proximal ureter causing some obstruction.  Labs did not indicate any impairment of renal function.  Patient does have leukocytosis but no fever.  Discussed patient with Dr. Liliane Shi of urology who advised that patient would be appropriate to discharge with urology follow-up and advise started on Flomax and sent home with pain medication.  Discussed strict return precautions.  Vitals remained stable.  Discharged home  Patient management required discussion with the following services or consulting groups:  Urology  Complexity of Problems Addressed Acute complicated illness or Injury  Additional Data Reviewed and Analyzed Further history obtained  from: Further history from spouse/family member, Past medical history and medications listed in the EMR, and Prior ED visit notes  Patient Encounter Risk Assessment Prescriptions         Final Clinical Impression(s) / ED Diagnoses Final diagnoses:  Ureterolithiasis    Rx / DC Orders ED Discharge Orders          Ordered    HYDROcodone-acetaminophen (NORCO/VICODIN) 5-325 MG tablet  Every 4 hours PRN        12/08/22 1335    tamsulosin (FLOMAX) 0.4 MG CAPS capsule  Daily        12/08/22 1336              Gareth Eagle, PA-C 12/08/22 1337    Franne Forts, DO 12/24/22 0945

## 2022-12-10 ENCOUNTER — Other Ambulatory Visit: Payer: Self-pay | Admitting: Urology

## 2022-12-16 ENCOUNTER — Encounter (HOSPITAL_BASED_OUTPATIENT_CLINIC_OR_DEPARTMENT_OTHER): Payer: Self-pay

## 2022-12-16 HISTORY — PX: TOTAL KNEE ARTHROPLASTY: SHX125

## 2022-12-16 NOTE — Progress Notes (Signed)
Talked with patient. Instructions given. Arrival time 79. Nothing to eat or drink after Midnight. Driver secured and will spend the night with the driver. Hx and meds reviewed. Bring in blue folder on Friday. To stop celebrex and Voltaren gel 48 hrs

## 2022-12-19 ENCOUNTER — Ambulatory Visit (HOSPITAL_BASED_OUTPATIENT_CLINIC_OR_DEPARTMENT_OTHER)
Admission: RE | Admit: 2022-12-19 | Discharge: 2022-12-19 | Disposition: A | Discharge status: Home / Self Care | Payer: BC Managed Care – PPO | Attending: Urology | Admitting: Urology

## 2022-12-19 ENCOUNTER — Ambulatory Visit (HOSPITAL_COMMUNITY): Payer: BC Managed Care – PPO

## 2022-12-19 ENCOUNTER — Encounter (HOSPITAL_BASED_OUTPATIENT_CLINIC_OR_DEPARTMENT_OTHER)
Admission: RE | Disposition: A | Discharge status: Home / Self Care | Payer: Self-pay | Source: Home / Self Care | Attending: Urology

## 2022-12-19 ENCOUNTER — Encounter (HOSPITAL_BASED_OUTPATIENT_CLINIC_OR_DEPARTMENT_OTHER): Payer: Self-pay | Admitting: Urology

## 2022-12-19 DIAGNOSIS — N201 Calculus of ureter: Secondary | ICD-10-CM | POA: Insufficient documentation

## 2022-12-19 DIAGNOSIS — E119 Type 2 diabetes mellitus without complications: Secondary | ICD-10-CM | POA: Insufficient documentation

## 2022-12-19 DIAGNOSIS — E669 Obesity, unspecified: Secondary | ICD-10-CM | POA: Insufficient documentation

## 2022-12-19 DIAGNOSIS — R350 Frequency of micturition: Secondary | ICD-10-CM | POA: Insufficient documentation

## 2022-12-19 DIAGNOSIS — Z7985 Long-term (current) use of injectable non-insulin antidiabetic drugs: Secondary | ICD-10-CM | POA: Insufficient documentation

## 2022-12-19 DIAGNOSIS — Z01818 Encounter for other preprocedural examination: Secondary | ICD-10-CM

## 2022-12-19 HISTORY — PX: EXTRACORPOREAL SHOCK WAVE LITHOTRIPSY: SHX1557

## 2022-12-19 LAB — GLUCOSE, CAPILLARY: Glucose-Capillary: 117 mg/dL — ABNORMAL HIGH (ref 70–99)

## 2022-12-19 SURGERY — LITHOTRIPSY, ESWL
Anesthesia: LOCAL | Laterality: Left

## 2022-12-19 MED ORDER — CIPROFLOXACIN HCL 500 MG PO TABS
ORAL_TABLET | ORAL | Status: AC
  Filled 2022-12-19: qty 1

## 2022-12-19 MED ORDER — CIPROFLOXACIN HCL 500 MG PO TABS
500.0000 mg | ORAL_TABLET | ORAL | Status: AC
  Administered 2022-12-19: 500 mg via ORAL

## 2022-12-19 MED ORDER — DIPHENHYDRAMINE HCL 25 MG PO CAPS
25.0000 mg | ORAL_CAPSULE | ORAL | Status: AC
  Administered 2022-12-19: 25 mg via ORAL

## 2022-12-19 MED ORDER — SODIUM CHLORIDE 0.9 % IV SOLN: INTRAVENOUS | Status: DC

## 2022-12-19 MED ORDER — DIPHENHYDRAMINE HCL 25 MG PO CAPS
ORAL_CAPSULE | ORAL | Status: AC
  Filled 2022-12-19: qty 1

## 2022-12-19 MED ORDER — DIAZEPAM 5 MG PO TABS
10.0000 mg | ORAL_TABLET | ORAL | Status: AC
  Administered 2022-12-19: 10 mg via ORAL

## 2022-12-19 MED ORDER — HYDROCODONE-ACETAMINOPHEN 5-325 MG PO TABS
1.0000 | ORAL_TABLET | ORAL | 0 refills | Status: DC | PRN
Start: 2022-12-19 — End: 2023-07-10

## 2022-12-19 MED ORDER — DIAZEPAM 5 MG PO TABS
ORAL_TABLET | ORAL | Status: AC
  Filled 2022-12-19: qty 2

## 2022-12-19 NOTE — Discharge Instructions (Addendum)
  Post Anesthesia Home Care Instructions  Activity: Get plenty of rest for the remainder of the day. A responsible individual must stay with you for 24 hours following the procedure.  For the next 24 hours, DO NOT: -Drive a car -Advertising copywriter -Drink alcoholic beverages -Take any medication unless instructed by your physician -Make any legal decisions or sign important papers.  Meals: Start with liquid foods such as gelatin or soup. Progress to regular foods as tolerated. Avoid greasy, spicy, heavy foods. If nausea and/or vomiting occur, drink only clear liquids until the nausea and/or vomiting subsides. Call your physician if vomiting continues.      Activity:  You are encouraged to ambulate frequently (about every hour during waking hours) to help prevent blood clots from forming in your legs or lungs.    Diet: You should advance your diet as instructed by your physician.  It will be normal to have some bloating, nausea, and abdominal discomfort intermittently.  Prescriptions:  You will be provided a prescription for pain medication to take as needed.  If your pain is not severe enough to require the prescription pain medication, you may take extra strength Tylenol instead which will have less side effects.  You should also take a prescribed stool softener to avoid straining with bowel movements as the prescription pain medication may constipate you.  What to call us about: You should call the office [phone]) if you develop fever > 101 or develop persistent vomiting. Activity:  You are encouraged to ambulate frequently (about every hour during waking hours) to help prevent blood clots from forming in your legs or lungs.

## 2022-12-19 NOTE — H&P (Signed)
vOffice Visit Report     12/10/2022   --------------------------------------------------------------------------------   [name]  MRN: [id number]  DOB: 03/17/1961, 62 year old Female  SSN:    PRIMARY CARE:  Jeannett Senior A. Evlyn Kanner, MD  PRIMARY CARE FAX:  [phone]  REFERRING:    PROVIDER:  Alfredo Martinez, M.D.  LOCATION:  [facility] [phone]     --------------------------------------------------------------------------------   CC/HPI: I was consulted to assess the patient's ureteral stones. She went to the emergency room a few days ago with left sided pain with some nausea and vomiting. Serum creatinine was 0.6. White blood count was 4.4. Pain settled down and she was sent home. She was found to have a small nonobstructing stone in the right kidney. She was found to have 2 stones in the proximal ureter at the level of the lower pole of the left kidney. The most proximal stone was 6 mm in size. Just distal to this was a 3 mm stone. She was placed on Flomax. Yesterday she had more pain and oxycodone did not help a lot. Pain settled down spontaneously.   Patient normally voids every 2-3 hours. No nocturia.   She is on Ozempic for diabetes. Has not had a hysterectomy. Has had neck surgery.   No history of kidney stones bladder surgery or bladder infections recently     ALLERGIES: jardiance Pencillin    MEDICATIONS: Tamsulosin Hcl  Zyrtec  Celebrex  Gabapentin  Multivitamin  Singulair     GU PSH: None     PSH Notes: Cervical spine surgery 2010, meniscus repair 2014   NON-GU PSH: Gastric sleeve Meniscal Trnspl, Knee W/scpe Remove Gallbladder     GU PMH: None   NON-GU PMH: Arthritis Diabetes Type 2    FAMILY HISTORY: heart failure - Mother Kidney Stones - Father sepsis - Father stroke - Father   SOCIAL HISTORY: Marital Status: Single Preferred Language: English; Ethnicity: Not Hispanic Or Latino; Race: White Current Smoking Status:  Patient has never smoked.   Tobacco Use Assessment Completed: Used Tobacco in last 30 days? Does not use smokeless tobacco. Has never drank.  Drinks 3 caffeinated drinks per day. Patient's occupation IT trainer.    REVIEW OF SYSTEMS:    GU Review Female:   Patient denies frequent urination, hard to postpone urination, burning /pain with urination, get up at night to urinate, leakage of urine, stream starts and stops, trouble starting your stream, have to strain to urinate, and being pregnant.  Gastrointestinal (Upper):   Patient reports nausea and vomiting. Patient denies indigestion/ heartburn.  Gastrointestinal (Lower):   Patient denies diarrhea and constipation.  Constitutional:   Patient denies fever, night sweats, weight loss, and fatigue.  Skin:   Patient denies skin rash/ lesion and itching.  Eyes:   Patient denies blurred vision and double vision.  Ears/ Nose/ Throat:   Patient denies sore throat and sinus problems.  Hematologic/Lymphatic:   Patient denies swollen glands and easy bruising.  Cardiovascular:   Patient denies leg swelling and chest pains.  Respiratory:   Patient denies cough and shortness of breath.  Endocrine:   Patient denies excessive thirst.  Musculoskeletal:   Patient reports back pain. Patient denies joint pain.  Neurological:   Patient denies headaches and dizziness.  Psychologic:   Patient denies depression and anxiety.   VITAL SIGNS:      12/10/2022 09:33 AM  Weight 177 lb / 80.29 kg  Height 62 in / 157.48 cm  BP 134/77  mmHg  Pulse 105 /min  Temperature 97.5 F / 36.3 C  BMI 32.4 kg/m   MULTI-SYSTEM PHYSICAL EXAMINATION:    Constitutional: Well-nourished. No physical deformities. Normally developed. Good grooming.  Neck: Neck symmetrical, not swollen. Normal tracheal position.  Respiratory: No labored breathing, no use of accessory muscles.   Cardiovascular: Normal temperature, normal extremity pulses, no swelling, no varicosities.   Lymphatic: No enlargement of neck, axillae, groin.  Skin: No paleness, no jaundice, no cyanosis. No lesion, no ulcer, no rash.  Neurologic / Psychiatric: Oriented to time, oriented to place, oriented to person. No depression, no anxiety, no agitation.  Gastrointestinal: No mass, no tenderness, no rigidity, non obese abdomen.  Eyes: Normal conjunctivae. Normal eyelids.  Ears, Nose, Mouth, and Throat: Left ear no scars, no lesions, no masses. Right ear no scars, no lesions, no masses. Nose no scars, no lesions, no masses. Normal hearing. Normal lips.  Musculoskeletal: Normal gait and station of head and neck.     PAST DATA REVIEW: None   PROCEDURES:         KUB - 16109  I reviewed the KUB. I could see the 6 mm stone and 3 mm stone in the proximal left ureter adjacent to 1 another. She has a small stone in the right kidney. Renal shadows normal. Stones were easily identified. No bowel abnormalities      Patient confirmed No Neulasta OnPro Device.          PVR Ultrasound - 60454  Scanned Volume: 37 cc   ASSESSMENT:      ICD-10 Details  1 GU:   Ureteral calculus - N20.1   2   Urinary Frequency - R35.0           Notes:   I was consulted to assess the patient's ureteral stones. She went to the emergency room a few days ago with left sided pain with some nausea and vomiting. Serum creatinine was 0.6. White blood count was 14.4. Pain settled down and she was sent home. She was found to have a small nonobstructing stone in the right kidney. She was found to have 2 stones in the proximal ureter at the level of the lower pole of the left kidney. The most proximal stone was 6 mm in size. Just distal to this was a 3 mm stone. She was placed on Flomax. Yesterday she had more pain and oxycodone did not help a lot. Pain settled down spontaneously.   Patient normally voids every 2-3 hours. No nocturia.   She is on Ozempic for diabetes. Has not had a hysterectomy. Has had neck surgery.   I reviewed  the KUB. I could see the 6 mm stone and 3 mm stone in the proximal left ureter adjacent to 1 another. She has a small stone in the right kidney. Renal shadows normal. Stones were easily identified. No bowel abnormalities   Urine reviewed. Urine sent for culture. Chart reviewed. Bladder scan 37 mL   Patient has ureteral stones. They have not changed in position. Picture was drawn. I went through lithotripsy in full detail. I conceptually went over ureteroscopy. Rare risks requiring intervention and injury to other organs discussed. She understands that the stone separated between now and lithotripsy they may not be able to do the treatment. I went over indication go to the [facility] emergency room. I sent in hydrocodone, Phenergan and gave her a strainer. She will stay on her Flomax. Zofran does not work that well. See nurse practitioner  next week with KUB prior to lithotripsy that she wants to be on the schedule for next Friday. She stopped the Ozempic last Thursday and does not want to go back on it. We talked about this issue is a contraindication unless held for a week. Call if urine culture positive      PLAN:            Medications New Meds: Hydrocodone-Acetaminophen 5 mg-325 mg tablet 1-2 tablet PO Q 6 H PRN   #14  0 Refill(s)  Promethazine Hcl 25 mg tablet 1 tablet PO Q 8 H PRN   #10  0 Refill(s)  Pharmacy Name:  [facility]  Address:  [address]   [place], [place] [address]  Phone:  [phone]  Fax:  [phone]            Orders X-Rays: KUB          Schedule Return Visit/Planned Activity: Next Available Appointment - Extender, KUB             Note: next week before next Friday           Document Letter(s):  Created for Patient: Clinical Summary         Next Appointment:      Next Appointment: 12/18/2022 08:30 AM    Appointment Type: KUB    Location: [facility] [phone]    Provider: KUB KUB    Reason for Visit: nxt ava kub     Urology  Preoperative H&P   Chief Complaint: Left ureteral stone  History of Present Illness: [name] is a 62 y.o. female with a left ureteral stone here for left ESWL. Denies fevers, chills, dysuria.    Past Medical History:  Diagnosis Date   Arthritis 03/05/2010   Complication of anesthesia    Diabetes mellitus 04/17/2008   Gallstones 05/31/2011   Numbness in both hands parts of both hands   all the time since cervical fusion   Numbness in feet    numbness parts of both feet since cervical fusion   Obesity    PONV (postoperative nausea and vomiting)     Past Surgical History:  Procedure Laterality Date    endometria lablation  02-2005   CERVICAL FUSION  04/17/08   CHOLECYSTECTOMY  07/01/2011   Procedure: LAPAROSCOPIC CHOLECYSTECTOMY WITH INTRAOPERATIVE CHOLANGIOGRAM;  Surgeon: Maisie Fus A. Cornett, MD;  Location: [facility];  Service: General;  Laterality: N/A;   COLONOSCOPY WITH PROPOFOL N/A 02/25/2021   Procedure: COLONOSCOPY WITH PROPOFOL;  Surgeon: Rachael Fee, MD;  Location: [facility];  Service: Endoscopy;  Laterality: N/A;   HIATAL HERNIA REPAIR N/A 10/15/2020   Procedure: HERNIA REPAIR HIATAL;  Surgeon: Luretha Murphy, MD;  Location: [facility];  Service: General;  Laterality: N/A;   KNEE SURGERY Left 2014   Torn meniscus   POLYPECTOMY  02/25/2021   Procedure: POLYPECTOMY;  Surgeon: Rachael Fee, MD;  Location: [facility];  Service: Endoscopy;;   UPPER GI ENDOSCOPY N/A 10/15/2020   Procedure: UPPER GI ENDOSCOPY;  Surgeon: Luretha Murphy, MD;  Location: [facility];  Service: General;  Laterality: N/A;    Allergies:  Allergies  Allergen Reactions   Jardiance [Empagliflozin] Other (See Comments)    Diabetic ketoacidosis    Penicillins Hives    Family History  Problem Relation Age of Onset   Stroke Father    Diabetes Father    Heart disease Father    Colon polyps Mother  Lung cancer Maternal Aunt    Diabetes Maternal Grandfather    Heart disease Maternal  Grandfather    Liver cancer Maternal Aunt    Colon cancer Neg Hx     Social History:  reports that she has never smoked. She has never used smokeless tobacco. She reports that she does not drink alcohol and does not use drugs.  ROS: A complete review of systems was performed.  All systems are negative except for pertinent findings as noted.  Physical Exam:  Vital signs in last 24 hours: Temp:  [98.1 F (36.7 C)] 98.1 F (36.7 C) (10/04 0928) Pulse Rate:  [87] 87 (10/04 0928) Resp:  [18] 18 (10/04 0928) BP: (138)/(76) 138/76 (10/04 0928) SpO2:  [100 %] 100 % (10/04 0928) Weight:  [82.1 kg] 82.1 kg (10/04 0928) Constitutional:  Alert and oriented, No acute distress Cardiovascular: Regular rate and rhythm Respiratory: Normal respiratory effort, Lungs clear bilaterally GI: Abdomen is soft, nontender, nondistended, no abdominal masses GU: No CVA tenderness Lymphatic: No lymphadenopathy Neurologic: Grossly intact, no focal deficits Psychiatric: Normal mood and affect  Laboratory Data:  No results for input(s): "WBC", "HGB", "HCT", "PLT" in the last 72 hours.  No results for input(s): "NA", "K", "CL", "GLUCOSE", "BUN", "CALCIUM", "CREATININE" in the last 72 hours.  Invalid input(s): "CO3"   Results for orders placed or performed during the hospital encounter of 12/19/22 (from the past 24 hour(s))  Glucose, capillary     Status: Abnormal   Collection Time: 12/19/22  9:42 AM  Result Value Ref Range   Glucose-Capillary 117 (H) 70 - 99 mg/dL   No results found for this or any previous visit (from the past 240 hour(s)).  Renal Function: No results for input(s): "CREATININE" in the last 168 hours. Estimated Creatinine Clearance: 73.3 mL/min (by C-G formula based on SCr of 0.66 mg/dL).  Radiologic Imaging: No results found.  I independently reviewed the above imaging studies.  Assessment and Plan [name] is a 62 y.o. female with left ureteral stone here for left  ESWL.  The risks, benefits and alternatives of left ESWL was discussed with the patient. I described the risks which include arrhythmia, kidney contusion, kidney hemorrhage, need for transfusion, back discomfort, flank ecchymosis, flank abrasion, inability to fracture the stone, inability to pass stone fragments, Steinstrasse, infection associated with obstructing stones, need for an alternative surgical procedure and possible need for repeat shockwave lithotripsy.  The patient voices understanding and wishes to proceed.    Matt R. Aaralynn Shepheard MD 12/19/2022, 11:35 AM  [facility] Pager: [phone]): [phone]

## 2022-12-19 NOTE — Op Note (Signed)
ESWL Operative Note  Treating Physician: Malorie Bigford, MD  Pre-op diagnosis: Left ureteral stone  Post-op diagnosis: Same   Procedure: Left  ESWL  See [facility] OP note scanned into chart. Also because of the size, density, location and other factors that cannot be anticipated I feel this will likely be a staged procedure. This fact supersedes any indication in the scanned [facility] operative note to the contrary.  Matt R. Lewanna Petrak MD [facility]  Pager: [phone]   

## 2022-12-22 ENCOUNTER — Encounter (HOSPITAL_BASED_OUTPATIENT_CLINIC_OR_DEPARTMENT_OTHER): Payer: Self-pay | Admitting: Urology

## 2023-04-22 ENCOUNTER — Other Ambulatory Visit: Payer: Self-pay | Admitting: Endocrinology

## 2023-04-22 DIAGNOSIS — Z1231 Encounter for screening mammogram for malignant neoplasm of breast: Secondary | ICD-10-CM

## 2023-05-25 ENCOUNTER — Ambulatory Visit
Admission: RE | Admit: 2023-05-25 | Discharge: 2023-05-25 | Disposition: A | Discharge status: Home / Self Care | Payer: 59 | Source: Ambulatory Visit

## 2023-05-25 DIAGNOSIS — Z1231 Encounter for screening mammogram for malignant neoplasm of breast: Secondary | ICD-10-CM

## 2023-06-24 ENCOUNTER — Other Ambulatory Visit: Payer: Self-pay | Admitting: Neurosurgery

## 2023-06-30 NOTE — Progress Notes (Signed)
 Surgical Instructions   Your procedure is scheduled on Wednesday, April 23rd.. Report to [facility] Main Entrance "A" at 9:45 A.M., then check in with the Admitting office. Any questions or running late day of surgery: call [phone]  Questions prior to your surgery date: call [phone], Monday-Friday, 8am-4pm. If you experience any cold or flu symptoms such as cough, fever, chills, shortness of breath, etc. between now and your scheduled surgery, please notify us at the above number.     Remember:  Do not eat or drink after midnight the night before your surgery  Take these medicines the morning of surgery with A SIP OF WATER . cetirizine (ZYRTEC)    May take these medicines IF NEEDED: acetaminophen (TYLENOL)  DULoxetine (CYMBALTA)  gabapentin (NEURONTIN)    Per your surgeons instructions, HOLD your celecoxib (CELEBREX) for 5 days prior to surgery.  Last dose on Thursday, April 17th.    One week prior to surgery, STOP taking any Aspirin (unless otherwise instructed by your surgeon) Aleve, Naproxen, Ibuprofen, Motrin, Advil, Goody's, BC's, all herbal medications, fish oil, and non-prescription vitamins. This includes your diclofenac Sodium (VOLTAREN) gel.           WHAT DO I DO ABOUT MY DIABETES MEDICATION?   HOLD your [facility] for 7 days prior to your surgery.  Last dose should be on or before Tuesday, April 15th.       HOW TO MANAGE YOUR DIABETES BEFORE AND AFTER SURGERY  Why is it important to control my blood sugar before and after surgery? Improving blood sugar levels before and after surgery helps healing and can limit problems. A way of improving blood sugar control is eating a healthy diet by:  Eating less sugar and carbohydrates  Increasing activity/exercise  Talking with your doctor about reaching your blood sugar goals High blood sugars (greater than 180 mg/dL) can raise your risk of infections and slow your recovery, so you will need to focus on  controlling your diabetes during the weeks before surgery. Make sure that the doctor who takes care of your diabetes knows about your planned surgery including the date and location.  How do I manage my blood sugar before surgery? Check your blood sugar at least 4 times a day, starting 2 days before surgery, to make sure that the level is not too high or low.  Check your blood sugar the morning of your surgery when you wake up and every 2 hours until you get to the Short Stay unit.  If your blood sugar is less than 70 mg/dL, you will need to treat for low blood sugar: Do not take insulin. Treat a low blood sugar (less than 70 mg/dL) with  cup of clear juice (cranberry or apple), 4 glucose tablets, OR glucose gel. Recheck blood sugar in 15 minutes after treatment (to make sure it is greater than 70 mg/dL). If your blood sugar is not greater than 70 mg/dL on recheck, call [phone] for further instructions. Report your blood sugar to the short stay nurse when you get to Short Stay.  If you are admitted to the hospital after surgery: Your blood sugar will be checked by the staff and you will probably be given insulin after surgery (instead of oral diabetes medicines) to make sure you have good blood sugar levels. The goal for blood sugar control after surgery is 80-180 mg/dL.             Do NOT Smoke (Tobacco/Vaping) for 24 hours prior to your  procedure.  If you use a CPAP at night, you may bring your mask/headgear for your overnight stay.   You will be asked to remove any contacts, glasses, piercing's, hearing aid's, dentures/partials prior to surgery. Please bring cases for these items if needed.    Patients discharged the day of surgery will not be allowed to drive home, and someone needs to stay with them for 24 hours.  SURGICAL WAITING ROOM VISITATION Patients may have no more than 2 support people in the waiting area - these visitors may rotate.   Pre-op nurse will coordinate an  appropriate time for 1 ADULT support person, who may not rotate, to accompany patient in pre-op.  Children under the age of 55 must have an adult with them who is not the patient and must remain in the main waiting area with an adult.  If the patient needs to stay at the hospital during part of their recovery, the visitor guidelines for inpatient rooms apply.  Please refer to the [facility] website for the visitor guidelines for any additional information.   If you received a COVID test during your pre-op visit  it is requested that you wear a mask when out in public, stay away from anyone that may not be feeling well and notify your surgeon if you develop symptoms. If you have been in contact with anyone that has tested positive in the last 10 days please notify you surgeon.      Pre-operative 5 CHG Bathing Instructions   You can play a key role in reducing the risk of infection after surgery. Your skin needs to be as free of germs as possible. You can reduce the number of germs on your skin by washing with CHG (chlorhexidine gluconate) soap before surgery. CHG is an antiseptic soap that kills germs and continues to kill germs even after washing.   DO NOT use if you have an allergy to chlorhexidine/CHG or antibacterial soaps. If your skin becomes reddened or irritated, stop using the CHG and notify one of our RNs at [phone].   Please shower with the CHG soap starting 4 days before surgery using the following schedule:     Please keep in mind the following:  DO NOT shave, including legs and underarms, starting the day of your first shower.   You may shave your face at any point before/day of surgery.  Place clean sheets on your bed the day you start using CHG soap. Use a clean washcloth (not used since being washed) for each shower. DO NOT sleep with pets once you start using the CHG.   CHG Shower Instructions:  Wash your face and private area with normal soap. If you choose to  wash your hair, wash first with your normal shampoo.  After you use shampoo/soap, rinse your hair and body thoroughly to remove shampoo/soap residue.  Turn the water OFF and apply about 3 tablespoons (45 ml) of CHG soap to a CLEAN washcloth.  Apply CHG soap ONLY FROM YOUR NECK DOWN TO YOUR TOES (washing for 3-5 minutes)  DO NOT use CHG soap on face, private areas, open wounds, or sores.  Pay special attention to the area where your surgery is being performed.  If you are having back surgery, having someone wash your back for you may be helpful. Wait 2 minutes after CHG soap is applied, then you may rinse off the CHG soap.  Pat dry with a clean towel  Put on clean clothes/pajamas   If  you choose to wear lotion, please use ONLY the CHG-compatible lotions that are listed below.  Additional instructions for the day of surgery: DO NOT APPLY any lotions, deodorants, cologne, or perfumes.   Do not bring valuables to the hospital. [facility] is not responsible for any belongings/valuables. Do not wear nail polish, gel polish, artificial nails, or any other type of covering on natural nails (fingers and toes) Do not wear jewelry or makeup Put on clean/comfortable clothes.  Please brush your teeth.  Ask your nurse before applying any prescription medications to the skin.     CHG Compatible Lotions   Aveeno Moisturizing lotion  Cetaphil Moisturizing Cream  Cetaphil Moisturizing Lotion  Clairol Herbal Essence Moisturizing Lotion, Dry Skin  Clairol Herbal Essence Moisturizing Lotion, Extra Dry Skin  Clairol Herbal Essence Moisturizing Lotion, Normal Skin  Curel Age Defying Therapeutic Moisturizing Lotion with Alpha Hydroxy  Curel Extreme Care Body Lotion  Curel Soothing Hands Moisturizing Hand Lotion  Curel Therapeutic Moisturizing Cream, Fragrance-Free  Curel Therapeutic Moisturizing Lotion, Fragrance-Free  Curel Therapeutic Moisturizing Lotion, Original Formula  Eucerin Daily Replenishing  Lotion  Eucerin Dry Skin Therapy Plus Alpha Hydroxy Crme  Eucerin Dry Skin Therapy Plus Alpha Hydroxy Lotion  Eucerin Original Crme  Eucerin Original Lotion  Eucerin Plus Crme Eucerin Plus Lotion  Eucerin TriLipid Replenishing Lotion  Keri Anti-Bacterial Hand Lotion  Keri Deep Conditioning Original Lotion Dry Skin Formula Softly Scented  Keri Deep Conditioning Original Lotion, Fragrance Free Sensitive Skin Formula  Keri Lotion Fast Absorbing Fragrance Free Sensitive Skin Formula  Keri Lotion Fast Absorbing Softly Scented Dry Skin Formula  Keri Original Lotion  Keri Skin Renewal Lotion Keri Silky Smooth Lotion  Keri Silky Smooth Sensitive Skin Lotion  Nivea Body Creamy Conditioning Oil  Nivea Body Extra Enriched Lotion  Nivea Body Original Lotion  Nivea Body Sheer Moisturizing Lotion Nivea Crme  Nivea Skin Firming Lotion  NutraDerm 30 Skin Lotion  NutraDerm Skin Lotion  NutraDerm Therapeutic Skin Cream  NutraDerm Therapeutic Skin Lotion  ProShield Protective Hand Cream  Provon moisturizing lotion  Please read over the following fact sheets that you were given.

## 2023-07-01 ENCOUNTER — Encounter (HOSPITAL_COMMUNITY)
Admission: RE | Admit: 2023-07-01 | Discharge: 2023-07-01 | Disposition: A | Discharge status: Home / Self Care | Source: Ambulatory Visit | Attending: Neurosurgery | Admitting: Neurosurgery

## 2023-07-01 ENCOUNTER — Encounter (HOSPITAL_COMMUNITY): Payer: Self-pay

## 2023-07-01 ENCOUNTER — Other Ambulatory Visit: Payer: Self-pay

## 2023-07-01 VITALS — BP 135/67 | HR 79 | Temp 98.2°F | Resp 16 | Ht 62.0 in | Wt 189.8 lb

## 2023-07-01 DIAGNOSIS — Z01818 Encounter for other preprocedural examination: Secondary | ICD-10-CM | POA: Insufficient documentation

## 2023-07-01 DIAGNOSIS — Z794 Long term (current) use of insulin: Secondary | ICD-10-CM | POA: Diagnosis not present

## 2023-07-01 DIAGNOSIS — E119 Type 2 diabetes mellitus without complications: Secondary | ICD-10-CM | POA: Diagnosis not present

## 2023-07-01 HISTORY — DX: Personal history of urinary calculi: Z87.442

## 2023-07-01 LAB — CBC
HCT: 46.6 % — ABNORMAL HIGH (ref 36.0–46.0)
Hemoglobin: 15.3 g/dL — ABNORMAL HIGH (ref 12.0–15.0)
MCH: 27.6 pg (ref 26.0–34.0)
MCHC: 32.8 g/dL (ref 30.0–36.0)
MCV: 84.1 fL (ref 80.0–100.0)
Platelets: 305 10*3/uL (ref 150–400)
RBC: 5.54 MIL/uL — ABNORMAL HIGH (ref 3.87–5.11)
RDW: 13.3 % (ref 11.5–15.5)
WBC: 12.5 10*3/uL — ABNORMAL HIGH (ref 4.0–10.5)
nRBC: 0 % (ref 0.0–0.2)

## 2023-07-01 LAB — BASIC METABOLIC PANEL WITH GFR
Anion gap: 10 (ref 5–15)
BUN: 20 mg/dL (ref 8–23)
CO2: 27 mmol/L (ref 22–32)
Calcium: 9.8 mg/dL (ref 8.9–10.3)
Chloride: 101 mmol/L (ref 98–111)
Creatinine, Ser: 0.64 mg/dL (ref 0.44–1.00)
GFR, Estimated: >60 mL/min (ref >60)
Glucose, Bld: 118 mg/dL — ABNORMAL HIGH (ref 70–99)
Potassium: 5.1 mmol/L (ref 3.5–5.1)
Sodium: 138 mmol/L (ref 135–145)

## 2023-07-01 LAB — TYPE AND SCREEN
ABO/RH(D): O POS
Antibody Screen: NEGATIVE

## 2023-07-01 LAB — SURGICAL PCR SCREEN
MRSA, PCR: NEGATIVE
Staphylococcus aureus: NEGATIVE

## 2023-07-01 LAB — GLUCOSE, CAPILLARY: Glucose-Capillary: 102 mg/dL — ABNORMAL HIGH (ref 70–99)

## 2023-07-01 NOTE — Progress Notes (Signed)
 PCP - Dr. Rosslyn Coons Cardiologist - denies  PPM/ICD - denies   Chest x-ray - 11/19/20 EKG - 07/01/23 Stress Test - denies ECHO - denies Cardiac Cath - denies  Sleep Study - denies   Fasting Blood Sugar - 100-120 Checks Blood Sugar continuously (sensor on right arm today)  Last dose of GLP1 agonist-  06/26/23 GLP1 instructions: Pt knows not to take any further doses prior to surgery  ASA/Blood Thinner Instructions: n/a   ERAS Protcol - no, NPO   COVID TEST- n/a   Anesthesia review: no  Patient denies shortness of breath, fever, cough and chest pain at PAT appointment   All instructions explained to the patient, with a verbal understanding of the material. Patient agrees to go over the instructions while at home for a better understanding.  The opportunity to ask questions was provided.

## 2023-07-07 NOTE — Progress Notes (Signed)
 Patient notified of new arrival time for surgery. Patient to arrive at 0835 on 07/08/2023.

## 2023-07-08 ENCOUNTER — Ambulatory Visit (HOSPITAL_COMMUNITY)

## 2023-07-08 ENCOUNTER — Other Ambulatory Visit: Payer: Self-pay

## 2023-07-08 ENCOUNTER — Encounter (HOSPITAL_COMMUNITY)
Admission: RE | Disposition: A | Discharge status: Home / Self Care | Payer: Self-pay | Source: Home / Self Care | Attending: Neurosurgery

## 2023-07-08 ENCOUNTER — Ambulatory Visit (HOSPITAL_COMMUNITY)
Admission: RE | Admit: 2023-07-08 | Discharge: 2023-07-10 | Disposition: A | Discharge status: Home / Self Care | Attending: Neurosurgery | Admitting: Neurosurgery

## 2023-07-08 ENCOUNTER — Ambulatory Visit (HOSPITAL_BASED_OUTPATIENT_CLINIC_OR_DEPARTMENT_OTHER)

## 2023-07-08 ENCOUNTER — Encounter (HOSPITAL_COMMUNITY): Payer: Self-pay | Admitting: Neurosurgery

## 2023-07-08 DIAGNOSIS — M2578 Osteophyte, vertebrae: Secondary | ICD-10-CM | POA: Diagnosis not present

## 2023-07-08 DIAGNOSIS — M4802 Spinal stenosis, cervical region: Secondary | ICD-10-CM | POA: Diagnosis present

## 2023-07-08 DIAGNOSIS — M4712 Other spondylosis with myelopathy, cervical region: Secondary | ICD-10-CM | POA: Diagnosis present

## 2023-07-08 DIAGNOSIS — M1990 Unspecified osteoarthritis, unspecified site: Secondary | ICD-10-CM | POA: Diagnosis not present

## 2023-07-08 DIAGNOSIS — Z6833 Body mass index (BMI) 33.0-33.9, adult: Secondary | ICD-10-CM | POA: Diagnosis not present

## 2023-07-08 DIAGNOSIS — E66813 Obesity, class 3: Secondary | ICD-10-CM | POA: Insufficient documentation

## 2023-07-08 DIAGNOSIS — M50121 Cervical disc disorder at C4-C5 level with radiculopathy: Secondary | ICD-10-CM | POA: Insufficient documentation

## 2023-07-08 DIAGNOSIS — M4722 Other spondylosis with radiculopathy, cervical region: Secondary | ICD-10-CM | POA: Diagnosis present

## 2023-07-08 DIAGNOSIS — E119 Type 2 diabetes mellitus without complications: Secondary | ICD-10-CM

## 2023-07-08 DIAGNOSIS — Z981 Arthrodesis status: Secondary | ICD-10-CM | POA: Diagnosis not present

## 2023-07-08 DIAGNOSIS — Z7985 Long-term (current) use of injectable non-insulin antidiabetic drugs: Secondary | ICD-10-CM | POA: Diagnosis not present

## 2023-07-08 DIAGNOSIS — M50021 Cervical disc disorder at C4-C5 level with myelopathy: Secondary | ICD-10-CM | POA: Diagnosis not present

## 2023-07-08 HISTORY — PX: ANTERIOR CERVICAL DECOMP/DISCECTOMY FUSION: SHX1161

## 2023-07-08 LAB — GLUCOSE, CAPILLARY
Glucose-Capillary: 122 mg/dL — ABNORMAL HIGH (ref 70–99)
Glucose-Capillary: 118 mg/dL — ABNORMAL HIGH (ref 70–99)
Glucose-Capillary: 120 mg/dL — ABNORMAL HIGH (ref 70–99)
Glucose-Capillary: 256 mg/dL — ABNORMAL HIGH (ref 70–99)

## 2023-07-08 SURGERY — ANTERIOR CERVICAL DECOMPRESSION/DISCECTOMY FUSION 1 LEVEL/HARDWARE REMOVAL
Anesthesia: General | Site: Spine Cervical

## 2023-07-08 MED ORDER — BACITRACIN ZINC 500 UNIT/GM EX OINT
TOPICAL_OINTMENT | CUTANEOUS | Status: DC | PRN
  Administered 2023-07-08: 1 via TOPICAL

## 2023-07-08 MED ORDER — VANCOMYCIN HCL IN DEXTROSE 1-5 GM/200ML-% IV SOLN: 1000.0000 mg | INTRAVENOUS | Status: DC

## 2023-07-08 MED ORDER — MORPHINE SULFATE (PF) 2 MG/ML IV SOLN: 2.0000 mg | INTRAVENOUS | Status: DC | PRN

## 2023-07-08 MED ORDER — INSULIN ASPART 100 UNIT/ML IJ SOLN
0.0000 [IU] | Freq: Every day | INTRAMUSCULAR | Status: DC
  Administered 2023-07-08: 3 [IU] via SUBCUTANEOUS

## 2023-07-08 MED ORDER — LIDOCAINE 2% (20 MG/ML) 5 ML SYRINGE
INTRAMUSCULAR | Status: AC
  Filled 2023-07-08: qty 5

## 2023-07-08 MED ORDER — THROMBIN 5000 UNITS EX KIT
PACK | CUTANEOUS | Status: AC
  Filled 2023-07-08: qty 1

## 2023-07-08 MED ORDER — ROCURONIUM BROMIDE 10 MG/ML (PF) SYRINGE
PREFILLED_SYRINGE | INTRAVENOUS | Status: DC | PRN
  Administered 2023-07-08 (×2): 20 mg via INTRAVENOUS
  Administered 2023-07-08: 60 mg via INTRAVENOUS
  Administered 2023-07-08 (×2): 20 mg via INTRAVENOUS

## 2023-07-08 MED ORDER — HYDROMORPHONE HCL 1 MG/ML IJ SOLN
0.2500 mg | INTRAMUSCULAR | Status: DC | PRN
  Administered 2023-07-08 (×3): 0.5 mg via INTRAVENOUS

## 2023-07-08 MED ORDER — INSULIN ASPART 100 UNIT/ML IJ SOLN: 0.0000 [IU] | INTRAMUSCULAR | Status: DC | PRN

## 2023-07-08 MED ORDER — CHLORHEXIDINE GLUCONATE 0.12 % MT SOLN: 15.0000 mL | Freq: Once | OROMUCOSAL | Status: AC

## 2023-07-08 MED ORDER — BISACODYL 10 MG RE SUPP: 10.0000 mg | Freq: Every day | RECTAL | Status: DC | PRN

## 2023-07-08 MED ORDER — BUPIVACAINE-EPINEPHRINE 0.5% -1:200000 IJ SOLN
INTRAMUSCULAR | Status: DC | PRN
  Administered 2023-07-08: 10 mL

## 2023-07-08 MED ORDER — BACITRACIN ZINC 500 UNIT/GM EX OINT
TOPICAL_OINTMENT | CUTANEOUS | Status: AC
  Filled 2023-07-08: qty 28.35

## 2023-07-08 MED ORDER — VANCOMYCIN HCL IN DEXTROSE 1-5 GM/200ML-% IV SOLN
INTRAVENOUS | Status: AC
  Administered 2023-07-08: 1000 mg
  Filled 2023-07-08: qty 200

## 2023-07-08 MED ORDER — DOCUSATE SODIUM 100 MG PO CAPS
100.0000 mg | ORAL_CAPSULE | Freq: Two times a day (BID) | ORAL | Status: DC
  Administered 2023-07-08 – 2023-07-10 (×4): 100 mg via ORAL
  Filled 2023-07-08 (×4): qty 1

## 2023-07-08 MED ORDER — NOREPINEPHRINE 4 MG/250ML-% IV SOLN
INTRAVENOUS | Status: AC
  Filled 2023-07-08: qty 250

## 2023-07-08 MED ORDER — DULOXETINE HCL 60 MG PO CPEP: 60.0000 mg | ORAL_CAPSULE | Freq: Every day | ORAL | Status: DC | PRN

## 2023-07-08 MED ORDER — GABAPENTIN 100 MG PO CAPS: 100.0000 mg | ORAL_CAPSULE | Freq: Two times a day (BID) | ORAL | Status: DC | PRN

## 2023-07-08 MED ORDER — PANTOPRAZOLE SODIUM 40 MG IV SOLR: 40.0000 mg | Freq: Every day | INTRAVENOUS | Status: DC

## 2023-07-08 MED ORDER — ONDANSETRON HCL 4 MG/2ML IJ SOLN
INTRAMUSCULAR | Status: AC
Start: 2023-07-08 — End: ?
  Filled 2023-07-08: qty 2

## 2023-07-08 MED ORDER — PHENYLEPHRINE 80 MCG/ML (10ML) SYRINGE FOR IV PUSH (FOR BLOOD PRESSURE SUPPORT)
PREFILLED_SYRINGE | INTRAVENOUS | Status: AC
  Filled 2023-07-08: qty 10

## 2023-07-08 MED ORDER — HYDROMORPHONE HCL 1 MG/ML IJ SOLN
INTRAMUSCULAR | Status: AC
  Filled 2023-07-08: qty 1

## 2023-07-08 MED ORDER — ACETAMINOPHEN 500 MG PO TABS: 1000.0000 mg | ORAL_TABLET | Freq: Once | ORAL | Status: DC

## 2023-07-08 MED ORDER — OXYCODONE HCL 5 MG PO TABS
10.0000 mg | ORAL_TABLET | ORAL | Status: DC | PRN
  Administered 2023-07-09 (×4): 10 mg via ORAL
  Filled 2023-07-08 (×5): qty 2

## 2023-07-08 MED ORDER — SUGAMMADEX SODIUM 200 MG/2ML IV SOLN
INTRAVENOUS | Status: DC | PRN
  Administered 2023-07-08: 200 mg via INTRAVENOUS

## 2023-07-08 MED ORDER — ACETAMINOPHEN 10 MG/ML IV SOLN
INTRAVENOUS | Status: DC | PRN
  Administered 2023-07-08: 1000 mg via INTRAVENOUS

## 2023-07-08 MED ORDER — DEXAMETHASONE SODIUM PHOSPHATE 10 MG/ML IJ SOLN
INTRAMUSCULAR | Status: DC | PRN
  Administered 2023-07-08: 10 mg via INTRAVENOUS

## 2023-07-08 MED ORDER — LACTATED RINGERS IV SOLN
INTRAVENOUS | Status: DC
Start: 2023-07-08 — End: 2023-07-08

## 2023-07-08 MED ORDER — DROPERIDOL 2.5 MG/ML IJ SOLN: 0.6250 mg | Freq: Once | INTRAMUSCULAR | Status: DC | PRN

## 2023-07-08 MED ORDER — INSULIN ASPART 100 UNIT/ML IJ SOLN
0.0000 [IU] | Freq: Three times a day (TID) | INTRAMUSCULAR | Status: DC
  Administered 2023-07-09 (×2): 3 [IU] via SUBCUTANEOUS

## 2023-07-08 MED ORDER — FENTANYL CITRATE (PF) 250 MCG/5ML IJ SOLN
INTRAMUSCULAR | Status: AC
  Filled 2023-07-08: qty 5

## 2023-07-08 MED ORDER — ACETAMINOPHEN 650 MG RE SUPP: 650.0000 mg | RECTAL | Status: DC | PRN

## 2023-07-08 MED ORDER — CHLORHEXIDINE GLUCONATE CLOTH 2 % EX PADS
6.0000 | MEDICATED_PAD | Freq: Once | CUTANEOUS | Status: DC
Start: 2023-07-08 — End: 2023-07-08

## 2023-07-08 MED ORDER — MIDAZOLAM HCL 2 MG/2ML IJ SOLN
INTRAMUSCULAR | Status: DC | PRN
  Administered 2023-07-08: 2 mg via INTRAVENOUS

## 2023-07-08 MED ORDER — PROPOFOL 10 MG/ML IV BOLUS
INTRAVENOUS | Status: DC | PRN
  Administered 2023-07-08: 140 mg via INTRAVENOUS

## 2023-07-08 MED ORDER — THROMBIN 5000 UNITS EX SOLR: OROMUCOSAL | Status: DC | PRN

## 2023-07-08 MED ORDER — LORATADINE 10 MG PO TABS
10.0000 mg | ORAL_TABLET | Freq: Every day | ORAL | Status: DC
  Administered 2023-07-09 – 2023-07-10 (×2): 10 mg via ORAL
  Filled 2023-07-08 (×2): qty 1

## 2023-07-08 MED ORDER — ONDANSETRON HCL 4 MG/2ML IJ SOLN: 4.0000 mg | Freq: Four times a day (QID) | INTRAMUSCULAR | Status: DC | PRN

## 2023-07-08 MED ORDER — CHLORHEXIDINE GLUCONATE CLOTH 2 % EX PADS: 6.0000 | MEDICATED_PAD | Freq: Once | CUTANEOUS | Status: DC

## 2023-07-08 MED ORDER — DEXAMETHASONE SODIUM PHOSPHATE 10 MG/ML IJ SOLN
INTRAMUSCULAR | Status: AC
  Filled 2023-07-08: qty 1

## 2023-07-08 MED ORDER — ORAL CARE MOUTH RINSE: 15.0000 mL | Freq: Once | OROMUCOSAL | Status: AC

## 2023-07-08 MED ORDER — PROPOFOL 500 MG/50ML IV EMUL
INTRAVENOUS | Status: DC | PRN
  Administered 2023-07-08: 100 ug/kg/min via INTRAVENOUS

## 2023-07-08 MED ORDER — FENTANYL CITRATE (PF) 250 MCG/5ML IJ SOLN
INTRAMUSCULAR | Status: DC | PRN
Start: 2023-07-08 — End: 2023-07-08
  Administered 2023-07-08 (×3): 50 ug via INTRAVENOUS
  Administered 2023-07-08: 100 ug via INTRAVENOUS

## 2023-07-08 MED ORDER — CYCLOBENZAPRINE HCL 10 MG PO TABS
10.0000 mg | ORAL_TABLET | Freq: Three times a day (TID) | ORAL | Status: DC | PRN
  Administered 2023-07-08: 10 mg via ORAL
  Filled 2023-07-08: qty 1

## 2023-07-08 MED ORDER — PANTOPRAZOLE SODIUM 40 MG PO TBEC
40.0000 mg | DELAYED_RELEASE_TABLET | Freq: Every day | ORAL | Status: DC
  Administered 2023-07-08 – 2023-07-09 (×2): 40 mg via ORAL
  Filled 2023-07-08 (×2): qty 1

## 2023-07-08 MED ORDER — CHLORHEXIDINE GLUCONATE 0.12 % MT SOLN
OROMUCOSAL | Status: AC
  Administered 2023-07-08: 15 mL via OROMUCOSAL
  Filled 2023-07-08: qty 15

## 2023-07-08 MED ORDER — ACETAMINOPHEN 325 MG PO TABS: 650.0000 mg | ORAL_TABLET | ORAL | Status: DC | PRN

## 2023-07-08 MED ORDER — LACTATED RINGERS IV SOLN: INTRAVENOUS | Status: DC

## 2023-07-08 MED ORDER — DEXAMETHASONE SODIUM PHOSPHATE 4 MG/ML IJ SOLN
4.0000 mg | Freq: Four times a day (QID) | INTRAMUSCULAR | Status: AC
  Administered 2023-07-08: 4 mg via INTRAVENOUS
  Filled 2023-07-08: qty 1

## 2023-07-08 MED ORDER — VANCOMYCIN HCL IN DEXTROSE 1-5 GM/200ML-% IV SOLN
1000.0000 mg | Freq: Once | INTRAVENOUS | Status: AC
  Administered 2023-07-08: 1000 mg via INTRAVENOUS
  Filled 2023-07-08: qty 200

## 2023-07-08 MED ORDER — ZOLPIDEM TARTRATE 5 MG PO TABS: 5.0000 mg | ORAL_TABLET | Freq: Every evening | ORAL | Status: DC | PRN

## 2023-07-08 MED ORDER — ALUM & MAG HYDROXIDE-SIMETH 200-200-20 MG/5ML PO SUSP: 30.0000 mL | Freq: Four times a day (QID) | ORAL | Status: DC | PRN

## 2023-07-08 MED ORDER — DEXAMETHASONE 4 MG PO TABS
4.0000 mg | ORAL_TABLET | Freq: Four times a day (QID) | ORAL | Status: AC
  Administered 2023-07-08: 4 mg via ORAL
  Filled 2023-07-08: qty 1

## 2023-07-08 MED ORDER — LIDOCAINE 2% (20 MG/ML) 5 ML SYRINGE
INTRAMUSCULAR | Status: DC | PRN
  Administered 2023-07-08: 80 mg via INTRAVENOUS

## 2023-07-08 MED ORDER — PHENOL 1.4 % MT LIQD: 1.0000 | OROMUCOSAL | Status: DC | PRN

## 2023-07-08 MED ORDER — ONDANSETRON HCL 4 MG/2ML IJ SOLN
INTRAMUSCULAR | Status: DC | PRN
  Administered 2023-07-08: 4 mg via INTRAVENOUS

## 2023-07-08 MED ORDER — ACETAMINOPHEN 10 MG/ML IV SOLN
INTRAVENOUS | Status: AC
  Filled 2023-07-08: qty 100

## 2023-07-08 MED ORDER — PHENYLEPHRINE HCL-NACL 20-0.9 MG/250ML-% IV SOLN
INTRAVENOUS | Status: DC | PRN
  Administered 2023-07-08: 40 ug/min via INTRAVENOUS

## 2023-07-08 MED ORDER — ROCURONIUM BROMIDE 10 MG/ML (PF) SYRINGE
PREFILLED_SYRINGE | INTRAVENOUS | Status: AC
  Filled 2023-07-08: qty 10

## 2023-07-08 MED ORDER — MONTELUKAST SODIUM 10 MG PO TABS
10.0000 mg | ORAL_TABLET | Freq: Every day | ORAL | Status: DC
Start: 2023-07-08 — End: 2023-07-10
  Administered 2023-07-08 – 2023-07-09 (×2): 10 mg via ORAL
  Filled 2023-07-08 (×2): qty 1

## 2023-07-08 MED ORDER — OXYCODONE HCL 5 MG PO TABS
5.0000 mg | ORAL_TABLET | ORAL | Status: DC | PRN
  Administered 2023-07-08: 5 mg via ORAL
  Filled 2023-07-08: qty 1

## 2023-07-08 MED ORDER — MIDAZOLAM HCL 2 MG/2ML IJ SOLN
INTRAMUSCULAR | Status: AC
  Filled 2023-07-08: qty 2

## 2023-07-08 MED ORDER — SCOPOLAMINE 1 MG/3DAYS TD PT72SCOPOLAMINE 1 MG/3DAYS
MEDICATED_PATCH | TRANSDERMAL | Status: DC | PRN
Start: 2023-07-08 — End: 2023-07-08
  Administered 2023-07-08: 1 via TRANSDERMAL

## 2023-07-08 MED ORDER — BUPIVACAINE-EPINEPHRINE (PF) 0.5% -1:200000 IJ SOLN
INTRAMUSCULAR | Status: AC
  Filled 2023-07-08: qty 30

## 2023-07-08 MED ORDER — ONDANSETRON HCL 4 MG PO TABS: 4.0000 mg | ORAL_TABLET | Freq: Four times a day (QID) | ORAL | Status: DC | PRN

## 2023-07-08 MED ORDER — 0.9 % SODIUM CHLORIDE (POUR BTL) OPTIME
TOPICAL | Status: DC | PRN
  Administered 2023-07-08: 1000 mL

## 2023-07-08 MED ORDER — ACETAMINOPHEN 500 MG PO TABS
1000.0000 mg | ORAL_TABLET | Freq: Four times a day (QID) | ORAL | Status: AC
  Administered 2023-07-08 – 2023-07-09 (×4): 1000 mg via ORAL
  Filled 2023-07-08 (×4): qty 2

## 2023-07-08 MED ORDER — GABAPENTIN 300 MG PO CAPS: 300.0000 mg | ORAL_CAPSULE | Freq: Once | ORAL | Status: DC

## 2023-07-08 MED ORDER — MENTHOL 3 MG MT LOZG
1.0000 | LOZENGE | OROMUCOSAL | Status: DC | PRN
  Administered 2023-07-09: 3 mg via ORAL
  Filled 2023-07-08: qty 9

## 2023-07-08 SURGICAL SUPPLY — 53 items
BAG COUNTER SPONGE SURGICOUNT (BAG) ×1 IMPLANT
BAND RUBBER #18 3X1/16 STRL (MISCELLANEOUS) ×2 IMPLANT
BENZOIN TINCTURE PRP APPL 2/3 (GAUZE/BANDAGES/DRESSINGS) ×2 IMPLANT
BIT DRILL NEURO 2X3.1 SFT TUCH (MISCELLANEOUS) ×1 IMPLANT
BLADE SURG 15 STRL LF DISP TIS (BLADE) ×1 IMPLANT
BLADE ULTRA TIP 2M (BLADE) ×1 IMPLANT
BUR BARREL STRAIGHT FLUTE 4.0 (BURR) ×1 IMPLANT
BUR MATCHSTICK NEURO 3.0 LAGG (BURR) ×1 IMPLANT
CANISTER SUCT 3000ML PPV (MISCELLANEOUS) ×1 IMPLANT
CLSR STERI-STRIP ANTIMIC 1/2X4 (GAUZE/BANDAGES/DRESSINGS) IMPLANT
COVER MAYO STAND STRL (DRAPES) ×1 IMPLANT
Cascadia Interbody Systems Cervical Body (Cage) IMPLANT
DRAPE LAPAROTOMY 100X72 PEDS (DRAPES) ×1 IMPLANT
DRAPE MICROSCOPE SLANT 54X150 (MISCELLANEOUS) IMPLANT
DRAPE SURG 17X23 STRL (DRAPES) ×2 IMPLANT
DRSG OPSITE POSTOP 3X4 (GAUZE/BANDAGES/DRESSINGS) ×1 IMPLANT
DRSG OPSITE POSTOP 4X6 (GAUZE/BANDAGES/DRESSINGS) IMPLANT
ELECTRODE REM PT RTRN 9FT ADLT (ELECTROSURGICAL) ×1 IMPLANT
GAUZE 4X4 16PLY ~~LOC~~+RFID DBL (SPONGE) IMPLANT
GLOVE BIO SURGEON STRL SZ 6.5 (GLOVE) ×1 IMPLANT
GLOVE BIO SURGEON STRL SZ8 (GLOVE) ×1 IMPLANT
GLOVE BIO SURGEON STRL SZ8.5 (GLOVE) ×1 IMPLANT
GLOVE BIOGEL PI IND STRL 6.5 (GLOVE) ×1 IMPLANT
GLOVE EXAM NITRILE XL STR (GLOVE) IMPLANT
GOWN STRL REUS W/ TWL LRG LVL3 (GOWN DISPOSABLE) ×1 IMPLANT
GOWN STRL REUS W/ TWL XL LVL3 (GOWN DISPOSABLE) ×1 IMPLANT
HEMOSTAT POWDER KIT SURGIFOAM (HEMOSTASIS) ×1 IMPLANT
KIT BASIN OR (CUSTOM PROCEDURE TRAY) ×1 IMPLANT
KIT TURNOVER KIT B (KITS) ×1 IMPLANT
MARKER SKIN DUAL TIP RULER LAB (MISCELLANEOUS) ×1 IMPLANT
NDL HYPO 22X1.5 SAFETY MO (MISCELLANEOUS) ×1 IMPLANT
NDL SPNL 18GX3.5 QUINCKE PK (NEEDLE) ×1 IMPLANT
NEEDLE HYPO 22X1.5 SAFETY MO (MISCELLANEOUS) ×1 IMPLANT
NEEDLE SPNL 18GX3.5 QUINCKE PK (NEEDLE) ×1 IMPLANT
NS IRRIG 1000ML POUR BTL (IV SOLUTION) ×1 IMPLANT
PACK LAMINECTOMY NEURO (CUSTOM PROCEDURE TRAY) ×1 IMPLANT
PATTIES SURGICAL .5 X.5 (GAUZE/BANDAGES/DRESSINGS) ×1 IMPLANT
PATTIES SURGICAL 1X1 (DISPOSABLE) ×1 IMPLANT
PIN DISTRACTION 14MM (PIN) ×2 IMPLANT
PLATE CERV CONS OZARK 1X28 (Plate) IMPLANT
PUTTY DBM 2CC CALC GRAN (Putty) IMPLANT
SCREW VA ST OZARK 4X14 (Screw) IMPLANT
SCREW VA ST OZARK 4X14 FX (Screw) IMPLANT
SPIKE FLUID TRANSFER (MISCELLANEOUS) ×1 IMPLANT
SPONGE INTESTINAL PEANUT (DISPOSABLE) ×2 IMPLANT
SPONGE SURGIFOAM ABS GEL SZ50 (HEMOSTASIS) IMPLANT
STRIP CLOSURE SKIN 1/2X4 (GAUZE/BANDAGES/DRESSINGS) ×1 IMPLANT
SUT VIC AB 0 CT1 27XBRD ANTBC (SUTURE) ×1 IMPLANT
SUT VIC AB 3-0 SH 8-18 (SUTURE) ×1 IMPLANT
SYR 30ML SLIP (SYRINGE) ×1 IMPLANT
TOWEL GREEN STERILE (TOWEL DISPOSABLE) ×1 IMPLANT
TOWEL GREEN STERILE FF (TOWEL DISPOSABLE) ×1 IMPLANT
WATER STERILE IRR 1000ML POUR (IV SOLUTION) ×1 IMPLANT

## 2023-07-08 NOTE — Op Note (Signed)
 Brief history:The patient is a 63 year old white female on whom another physician performed a C5-6 anterior cervical diskectomy fusion plating on years ago.  The patient has developed neck pain, bilateral arm numbness tingling weakness, unsteady gait, etcetera.  She was worked up with a cervical MRI which demonstrated significant narrowing at C4-5.  I discussed the various treatment options with her.  She has decided proceed with surgery.  Preoperative diagnosis:  C4-5 herniated disc, spondylosis, stenosis, cervicalgia, cervical radiculopathy, cervical myelopathy,   Postoperative diagnosis: the same  Procedure: C4-5 Anterior cervical discectomy/decompression;  C4-5 interbody arthrodesis with local morcellized autograft bone and Zimmer DBM; insertion of interbody prosthesis at  C4-5 ( [employer]ker titanium interbody prosthesis); anterior cervical plating from C4-5 with  Stryker titanium plate; exploration of cervical fusion /removal of cervical plate at K4-4  Surgeon: Dr. Pleasant Brilliant  Asst.: Dr. Audie Bleacher and Marlana Silvan NP  Anesthesia: Gen. endotracheal  Estimated blood loss: 75 cc  Drains: None  Complications: None  Description of procedure: The patient was brought to the operating room by the anesthesia team. General endotracheal anesthesia was induced. A roll was placed under the patient's shoulders to keep the neck in the neutral position. The patient's anterior cervical region was then prepared with Betadine scrub and Betadine solution. Sterile drapes were applied.  The area to be incised was then injected with Marcaine  with epinephrine  solution. I then used a scalpel to make a transverse incision in the patient's left anterior neck. I used the Metzenbaum scissors to  dissect through the scar tissue from the previous operation, and 2divide the platysmal muscle and then to dissect medial to the sternocleidomastoid muscle, jugular vein, and carotid artery. I carefully dissected down towards  the anterior cervical spine identifying the esophagus and retracting it medially. Then using Kitner swabs to clear soft tissue from the anterior cervical spine into expose the old plate at W1-0.    We explored the fusion at C5-6 by  removing the plate and screws at C5-6.  The arthrodesis at C5-6 appeared solid.  I then used electrocautery to detach the medial border of the longus colli muscle bilaterally from the C4-5 intervertebral disc spaces. I then inserted the Caspar self-retaining retractor underneath the longus colli muscle bilaterally to provide exposure.  We then incised the intervertebral disc at  C4-5. We then performed a partial intervertebral discectomy with a pituitary forceps and the Karlin curettes. I then inserted distraction screws into the vertebral bodies at C4-5. We then distracted the interspace. We then used the high-speed drill to decorticate the vertebral endplates at C4-5, to drill away the remainder of the intervertebral disc, to drill away some posterior spondylosis, and to thin out the posterior longitudinal ligament. I then incised ligament with the arachnoid knife. We then removed the ligament with a Kerrison punches undercutting the vertebral endplates and decompressing the thecal sac. As expected we encountered a large osteophyte on the right.  We removed with the drill and the Kerrison punches decompressing the thecal sac. We then performed foraminotomies about the bilateral  C5nerve roots. This completed the decompression at this level.  We now turned our to attention to the interbody fusion. We used the trial spacers to determine the appropriate size for the interbody prosthesis. We then pre-filled prosthesis with a combination of local morcellized autograft bone that we obtained during decompression as well as Zimmer DBM. We then inserted the prosthesis into the distracted interspace at C4-5. We then removed the distraction screws. There was a  good snug fit of the  prosthesis in the interspace.  Having completed the fusion we now turned attention to the anterior spinal instrumentation. We used the high-speed drill to drill away some anterior spondylosis at the disc spaces so that the plate lay down flat. We selected the appropriate length titanium anterior cervical plate. We laid it along the anterior aspect of the vertebral bodies from C4-5. We then drilled  14 mm holes at C4, we  use the old screw holes at C5. We then secured the plate to the vertebral bodies by placing two  14 mm self-tapping screws at C4 and C5. We then obtained intraoperative radiograph. The demonstrating good position of the instrumentation. We therefore secured the screws the plate the locking each cam. This completed the instrumentation.  We then obtained hemostasis using bipolar electrocautery. We irrigated the wound out with  saline solution. We then removed the retractor. We inspected the esophagus for any damage. There was none apparent. We then reapproximated patient's platysmal muscle with interrupted 3-0 Vicryl suture. We then reapproximated the subcutaneous tissue with interrupted 3-0 Vicryl suture. The skin was reapproximated with Steri-Strips and benzoin. The wound was then covered with bacitracin  ointment. A sterile dressing was applied. The drapes were removed. Patient was subsequently extubated by the anesthesia team and transported to the post anesthesia care unit in stable condition. All sponge instrument and needle counts were reportedly correct at the end of this case.

## 2023-07-08 NOTE — Anesthesia Postprocedure Evaluation (Signed)
 Anesthesia Post Note  Patient: GUERLINE HAPP  Procedure(s) Performed: CERVICAL FOUR-FIVE ANTERIOR CERVICAL DECOMPRESSION/DISCECTOMY FUSION WITH REMOVAL OF PREVIOUS ANTERIOR CERVICAL PLATE (Spine Cervical)     Patient location during evaluation: PACU Anesthesia Type: General Level of consciousness: sedated and patient cooperative Pain management: pain level controlled Vital Signs Assessment: post-procedure vital signs reviewed and stable Respiratory status: spontaneous breathing Cardiovascular status: stable Anesthetic complications: no   No notable events documented.  Last Vitals:  Vitals:   07/08/23 1545 07/08/23 1613  BP: (!) 111/48 (!) 118/58  Pulse: 66 74  Resp: 12 18  Temp: (!) 36.4 C 36.7 C  SpO2: 100% 98%    Last Pain:  Vitals:   07/08/23 1613  TempSrc: Oral  PainSc:                  Gorman Laughter

## 2023-07-08 NOTE — Anesthesia Preprocedure Evaluation (Addendum)
 Anesthesia Evaluation  Patient identified by MRN, date of birth, ID band Patient awake    Reviewed: Allergy & Precautions, H&P , NPO status , Patient's Chart, lab work & pertinent test results  History of Anesthesia Complications (+) PONV and history of anesthetic complications  Airway Mallampati: II  TM Distance: >3 FB Neck ROM: Limited    Dental  (+) Dental Advisory Given, Teeth Intact   Pulmonary neg pulmonary ROS   Pulmonary exam normal breath sounds clear to auscultation       Cardiovascular Exercise Tolerance: Good negative cardio ROS  Rhythm:Regular Rate:Normal     Neuro/Psych negative neurological ROS  negative psych ROS   GI/Hepatic Neg liver ROS,GERD  ,,  Endo/Other  diabetes, Well Controlled, Type 2, Oral Hypoglycemic Agents  Class 3 obesity  Renal/GU negative Renal ROS     Musculoskeletal  (+) Arthritis ,    Abdominal  (+) + obese  Peds  Hematology negative hematology ROS (+)   Anesthesia Other Findings   Reproductive/Obstetrics                              Anesthesia Physical Anesthesia Plan  ASA: 3  Anesthesia Plan: General   Post-op Pain Management: Tylenol  PO (pre-op)* and Gabapentin  PO (pre-op)*   Induction: Intravenous  PONV Risk Score and Plan: 4 or greater and Ondansetron , Dexamethasone , Treatment may vary due to age or medical condition, Midazolam , TIVA and Propofol  infusion  Airway Management Planned: Oral ETT  Additional Equipment: ClearSight  Intra-op Plan:   Post-operative Plan: Extubation in OR  Informed Consent: I have reviewed the patients History and Physical, chart, labs and discussed the procedure including the risks, benefits and alternatives for the proposed anesthesia with the patient or authorized representative who has indicated his/her understanding and acceptance.     Dental advisory given  Plan Discussed with: CRNA  Anesthesia  Plan Comments:          Anesthesia Quick Evaluation

## 2023-07-08 NOTE — H&P (Signed)
 Subjective: The patient is a 63 year old white female whose had a previous anterior cervical discectomy and fusion by Dr. Cipriano Creeks years ago.  She developed recurrent neck pain bilateral arm and leg pain numbness tingling weakness unsteady gait, etc. consistent with a cervical myelopathy.  She was worked up with cervical MRI which demonstrated severe stenosis at C4-5.  I discussed the various treatment options with her.  She has decided proceed with surgery.  Past Medical History:  Diagnosis Date   Arthritis 03/05/2010   Complication of anesthesia    Diabetes mellitus 04/17/2008   Gallstones 05/31/2011   History of kidney stones    Numbness in both hands parts of both hands   all the time since cervical fusion   Numbness in feet    numbness parts of both feet since cervical fusion   Obesity    PONV (postoperative nausea and vomiting)     Past Surgical History:  Procedure Laterality Date    endometria lablation  02/14/2005   CERVICAL FUSION  04/17/2008   CHOLECYSTECTOMY  07/01/2011   Procedure: LAPAROSCOPIC CHOLECYSTECTOMY WITH INTRAOPERATIVE CHOLANGIOGRAM;  Surgeon: Andy Bannister A. Cornett, MD;  Location: [facility];  Service: General;  Laterality: N/A;   COLONOSCOPY WITH PROPOFOL  N/A 02/25/2021   Procedure: COLONOSCOPY WITH PROPOFOL ;  Surgeon: Janel Medford, MD;  Location: [facility];  Service: Endoscopy;  Laterality: N/A;   EXTRACORPOREAL SHOCK WAVE LITHOTRIPSY Left 12/19/2022   Procedure: LEFT EXTRACORPOREAL SHOCK WAVE LITHOTRIPSY (ESWL);  Surgeon: Lahoma Pigg, MD;  Location: [facility];  Service: Urology;  Laterality: Left;   HIATAL HERNIA REPAIR N/A 10/15/2020   Procedure: HERNIA REPAIR HIATAL;  Surgeon: Jacolyn Matar, MD;  Location: [facility];  Service: General;  Laterality: N/A;   KNEE SURGERY Left 03/17/2012   Torn meniscus   POLYPECTOMY  02/25/2021   Procedure: POLYPECTOMY;  Surgeon: Janel Medford, MD;  Location: [facility];  Service: Endoscopy;;   TOTAL  KNEE ARTHROPLASTY Left 12/2022   UPPER GI ENDOSCOPY N/A 10/15/2020   Procedure: UPPER GI ENDOSCOPY;  Surgeon: Jacolyn Matar, MD;  Location: [facility];  Service: General;  Laterality: N/A;    Allergies  Allergen Reactions   Jardiance [Empagliflozin] Other (See Comments)    Diabetic ketoacidosis    Penicillins Hives    Social History   Tobacco Use   Smoking status: Never   Smokeless tobacco: Never  Substance Use Topics   Alcohol use: No    Family History  Problem Relation Age of Onset   Stroke Father    Diabetes Father    Heart disease Father    Colon polyps Mother    Lung cancer Maternal Aunt    Diabetes Maternal Grandfather    Heart disease Maternal Grandfather    Liver cancer Maternal Aunt    Colon cancer Neg Hx    Prior to Admission medications   Medication Sig Start Date End Date Taking? Authorizing Provider  acetaminophen  (TYLENOL ) 500 MG tablet Take 1,000 mg by mouth every 6 (six) hours as needed for moderate pain (pain score 4-6).   Yes [provider]  Calcium Carbonate-Vitamin D ([employer] CALCIUM/VIT D SOFT CHEWS PO) Take 2 each by mouth daily.   Yes [provider]  celecoxib (CELEBREX) 200 MG capsule Take 200 mg by mouth daily. 12/10/20  Yes [provider]  cetirizine (ZYRTEC) 10 MG tablet Take 10 mg by mouth daily.   Yes [provider]  docusate sodium  (COLACE) 100 MG capsule Take 100 mg by mouth  daily as needed for moderate constipation.   Yes [provider]  DULoxetine  (CYMBALTA ) 60 MG capsule Take 60 mg by mouth daily as needed (neuropathy).   Yes [provider]  gabapentin  (NEURONTIN ) 100 MG capsule Take 100 mg by mouth 2 (two) times daily as needed (pain). 12/24/20  Yes [provider]  montelukast  (SINGULAIR ) 10 MG tablet Take 10 mg by mouth at bedtime.   Yes [provider]  MOUNJARO 5 MG/0.5ML Pen Inject 5 mg into the skin once a week. 05/29/23  Yes [provider]  Multiple  Vitamins-Minerals (BARIATRIC MULTIVITAMINS/IRON PO) Take 1 tablet by mouth daily.   Yes [provider]  blood glucose meter kit and supplies Dispense based on patient and insurance preference. Use up to four times daily as directed. (FOR ICD-10 E10.9, E11.9). 11/22/20   Hoyt Macleod, MD  diclofenac Sodium (VOLTAREN) 1 % GEL Apply 2 g topically daily as needed (pain).    [provider]  HYDROcodone -acetaminophen  (NORCO/VICODIN) 5-325 MG tablet Take 1 tablet by mouth every 4 (four) hours as needed for up to 18 doses. Patient not taking: Reported on 06/25/2023 12/19/22   Lahoma Pigg, MD  Insulin  Pen Needle (PEN NEEDLES 3/16") 31G X 5 MM MISC To use with Lantus  pen. 11/22/20   Hoyt Macleod, MD     Review of Systems  Positive ROS: Above  All other systems have been reviewed and were otherwise negative with the exception of those mentioned in the HPI and as above.  Objective: Vital signs in last 24 hours: Temp:  [97.7 F (36.5 C)] 97.7 F (36.5 C) (04/23 0817) Pulse Rate:  [78] 78 (04/23 0817) Resp:  [18] 18 (04/23 0817) BP: (139)/(67) 139/67 (04/23 0817) SpO2:  [100 %] 100 % (04/23 0817) Weight:  [83 kg] 83 kg (04/23 0817) Estimated body mass index is 33.47 kg/m as calculated from the following:   Height as of this encounter: 5\' 2"  (1.575 m).   Weight as of this encounter: 83 kg.   General Appearance: Alert Head: Normocephalic, without obvious abnormality, atraumatic Eyes: PERRL, conjunctiva/corneas clear, EOM's intact,    Ears: Normal  Throat: Normal  Neck: Her left anterior cervical incision is well-healed. Back: unremarkable Lungs: Clear to auscultation bilaterally, respirations unlabored Heart: Regular rate and rhythm, no murmur, rub or gallop Abdomen: Soft, non-tender Extremities: Extremities normal, atraumatic, no cyanosis or edema Skin: unremarkable  NEUROLOGIC:   Mental status: alert and oriented,Motor Exam - grossly normal Sensory Exam -she has  numbness in her bilateral hands Reflexes: Hyperreflexic Coordination - grossly normal Gait - grossly normal Balance - grossly normal Cranial Nerves: I: smell Not tested  II: visual acuity  OS: Normal  OD: Normal   II: visual fields Full to confrontation  II: pupils Equal, round, reactive to light  III,VII: ptosis None  III,IV,VI: extraocular muscles  Full ROM  V: mastication Normal  V: facial light touch sensation  Normal  V,VII: corneal reflex  Present  VII: facial muscle function - upper  Normal  VII: facial muscle function - lower Normal  VIII: hearing Not tested  IX: soft palate elevation  Normal  IX,X: gag reflex Present  XI: trapezius strength  5/5  XI: sternocleidomastoid strength 5/5  XI: neck flexion strength  5/5  XII: tongue strength  Normal    Data Review Lab Results  Component Value Date   WBC 12.5 (H) 07/01/2023   HGB 15.3 (H) 07/01/2023   HCT 46.6 (H) 07/01/2023   MCV  84.1 07/01/2023   PLT 305 07/01/2023   Lab Results  Component Value Date   NA 138 07/01/2023   K 5.1 07/01/2023   CL 101 07/01/2023   CO2 27 07/01/2023   BUN 20 07/01/2023   CREATININE 0.64 07/01/2023   GLUCOSE 118 (H) 07/01/2023   Lab Results  Component Value Date   INR 1.1 11/19/2020    Assessment/Plan: Cervical spondylosis, cervical stenosis, cervical myelopathy, cervicalgia: I have discussed the situation with the patient.  I have reviewed her imaging studies with her and pointed out the abnormalities.  We have discussed the various treatment options including surgery.  I have recommended a C4-5 anterior cervicectomy fusion and plating with possible removal overall plate.  I have described the surgery to her.  I have shown her surgical models.  I have given her surgical pamphlet.  We have discussed the risk, benefits, alternatives, expected postoperative course, and likelihood of achieving her goals with surgery.  I have answered all her questions.  She has decided proceed with  surgery.   Elder Greening 07/08/2023 9:40 AM

## 2023-07-08 NOTE — Anesthesia Procedure Notes (Addendum)
 Procedure Name: Intubation Date/Time: 07/08/2023 12:05 PM  Performed by: Johann Muta, CRNAPre-anesthesia Checklist: Patient identified, Emergency Drugs available, Suction available and Patient being monitored Patient Re-evaluated:Patient Re-evaluated prior to induction Oxygen Delivery Method: Circle System Utilized Preoxygenation: Pre-oxygenation with 100% oxygen Induction Type: IV induction Ventilation: Mask ventilation without difficulty Laryngoscope Size: Glidescope and 4 Grade View: Grade I Tube type: Oral Number of attempts: 1 Airway Equipment and Method: Stylet and Oral airway Placement Confirmation: ETT inserted through vocal cords under direct vision, positive ETCO2 and breath sounds checked- equal and bilateral Secured at: 21 cm Tube secured with: Tape Dental Injury: Teeth and Oropharynx as per pre-operative assessment

## 2023-07-08 NOTE — Progress Notes (Signed)
   Providing Compassionate, Quality Care - Together   Subjective: Patient reports pain between her shoulder blades.  Objective: Vital signs in last 24 hours: Temp:  [97.7 F (36.5 C)] 97.7 F (36.5 C) (04/23 1421) Pulse Rate:  [75-78] 78 (04/23 1430) Resp:  [17-28] 28 (04/23 1430) BP: (132-139)/(67-77) 132/67 (04/23 1430) SpO2:  [95 %-100 %] 95 % (04/23 1430) Weight:  [83 kg] 83 kg (04/23 0817)  Intake/Output from previous day: No intake/output data recorded. Intake/Output this shift: Total I/O In: 1100 [I.V.:1000; IV Piggyback:100] Out: 25 [Blood:25]  Alert and oriented x 4 PERRLA CN II-XII grossly intact MAE, Strength and sensation grossly intact Incision is covered with Honeycomb dressing and Steri Strips; Dressing is clean, dry, and intact   Lab Results: No results for input(s): "WBC", "HGB", "HCT", "PLT" in the last 72 hours. BMET No results for input(s): "NA", "K", "CL", "CO2", "GLUCOSE", "BUN", "CREATININE", "CALCIUM" in the last 72 hours.  Studies/Results: No results found.  Assessment/Plan: Patient underwent a C4-5 ACDF by Dr. Larrie Po today. She is moving all extremities and recovering from anesthesia in the PACU.   LOS: 0 days   -Patient will be admitted overnight for observation and discharge home tomorrow.  I am in communication with my attending and they agree with the plan for this patient.   Henreitta Locus, DNP, AGNP-C Nurse Practitioner  [facility] [address], [address], [place], [place] [address] P: [phone]    F: [phone]  07/08/2023, 2:33 PM

## 2023-07-08 NOTE — Transfer of Care (Signed)
 Immediate Anesthesia Transfer of Care Note  Patient: [name]  Procedure(s) Performed: CERVICAL FOUR-FIVE ANTERIOR CERVICAL DECOMPRESSION/DISCECTOMY FUSION WITH REMOVAL OF PREVIOUS ANTERIOR CERVICAL PLATE (Spine Cervical)  Patient Location: PACU  Anesthesia Type:General  Level of Consciousness: awake, alert , patient cooperative, and responds to stimulation  Airway & Oxygen Therapy: Patient Spontanous Breathing and Patient connected to face mask oxygen  Post-op Assessment: Report given to RN, Post -op Vital signs reviewed and stable, and Patient moving all extremities X 4  Post vital signs: Reviewed and stable  Last Vitals:  Vitals Value Taken Time  BP 137/77 07/08/23 1421  Temp    Pulse 78 07/08/23 1425  Resp 16 07/08/23 1425  SpO2 96 % 07/08/23 1425  Vitals shown include unfiled device data.  Last Pain:  Vitals:   07/08/23 0823  TempSrc:   PainSc: 9       Patients Stated Pain Goal: 1 (07/08/23 0823)  Complications: No notable events documented.

## 2023-07-08 NOTE — Progress Notes (Signed)
 Orthopedic Tech Progress Note Patient Details:  [name] [birth year] [id number]  Called in order to [employer] for an ASPEN COLLAR   Patient ID: [name], female   DOB: 04/29/1960, 63 y.o.   MRN: [id number]  [name] 07/08/2023, 5:02 PM

## 2023-07-09 DIAGNOSIS — M4802 Spinal stenosis, cervical region: Secondary | ICD-10-CM | POA: Diagnosis not present

## 2023-07-09 LAB — GLUCOSE, CAPILLARY
Glucose-Capillary: 159 mg/dL — ABNORMAL HIGH (ref 70–99)
Glucose-Capillary: 153 mg/dL — ABNORMAL HIGH (ref 70–99)
Glucose-Capillary: 100 mg/dL — ABNORMAL HIGH (ref 70–99)
Glucose-Capillary: 158 mg/dL — ABNORMAL HIGH (ref 70–99)

## 2023-07-09 NOTE — Evaluation (Signed)
 Physical Therapy Evaluation Patient Details Name: [name] MRN: [id number] DOB: [birth year] Today's Date: 07/09/2023  History of Present Illness  Pt admitted 4/23 for C4-5 Anterior cervical discectomy/decompression;  C4-5 interbody arthrodesis with local morcellized autograft bone and Zimmer DBM; insertion of interbody prosthesis at  C4-5 ( Stryker titanium interbody prosthesis); anterior cervical plating from C4-5 with  Stryker titanium plate; exploration of cervical fusion /removal of cervical plate at X9-1.PMH: cervical surgery, left TKA recently, arthritis, DM  Clinical Impression  Pt admitted with above diagnosis. Pt was able to ambulate with RW in hallway with cues and CGA. Fatigues quickly and posture worsens as she fatigues. Plans to stay with friend once she d/c's.  Pt will benefit from Outpt PT f/u.  Will follow acutely.  Pt currently with functional limitations due to the deficits listed below (see PT Problem List). Pt will benefit from acute skilled PT to increase their independence and safety with mobility to allow discharge.           If plan is discharge home, recommend the following: A little help with walking and/or transfers;Assistance with cooking/housework;Assist for transportation;Help with stairs or ramp for entrance   Can travel by private vehicle        Equipment Recommendations Rolling walker (2 wheels) (needs youth RW)  Recommendations for Other Services       Functional Status Assessment Patient has had a recent decline in their functional status and demonstrates the ability to make significant improvements in function in a reasonable and predictable amount of time.     Precautions / Restrictions Precautions Precautions: Cervical Precaution Booklet Issued: Yes (comment) Recall of Precautions/Restrictions: Intact Required Braces or Orthoses: Cervical Brace Cervical Brace: Hard collar;At all times Restrictions Weight Bearing Restrictions Per Provider  Order: No      Mobility  Bed Mobility               General bed mobility comments: NT in chair    Transfers Overall transfer level: Needs assistance Equipment used: Rolling walker (2 wheels) Transfers: Sit to/from Stand Sit to Stand: Supervision           General transfer comment: No assist needed. Did cues for hand placement    Ambulation/Gait Ambulation/Gait assistance: Contact guard assist Gait Distance (Feet): 225 Feet Assistive device: Rolling walker (2 wheels) Gait Pattern/deviations: Step-through pattern, Decreased step length - right, Decreased step length - left, Decreased stride length, Decreased dorsiflexion - left, Antalgic, Trunk flexed, Wide base of support   Gait velocity interpretation: <1.31 ft/sec, indicative of household ambulator   General Gait Details: Pt overall progressing well and able to ambulate with RW. Pt needed cues to stay close to RW and needed cues to stand tall as she flexes at trunk as she fatigues. More difficulty with navigating around obstacles.  Stairs            Wheelchair Mobility     Tilt Bed    Modified Rankin (Stroke Patients Only)       Balance Overall balance assessment: Needs assistance Sitting-balance support: No upper extremity supported, Feet supported Sitting balance-Leahy Scale: Fair     Standing balance support: Bilateral upper extremity supported, During functional activity, Reliant on assistive device for balance Standing balance-Leahy Scale: Poor                               Pertinent Vitals/Pain Pain Assessment Pain Assessment: Faces Faces Pain Scale: Hurts little  more Pain Location: neck and back Pain Descriptors / Indicators: Aching, Discomfort Pain Intervention(s): Limited activity within patient's tolerance, Monitored during session, Repositioned    Home Living Family/patient expects to be discharged to:: Private residence Living Arrangements: Alone Available Help at  Discharge: Friend(s);Available 24 hours/day (Initially staying with friend) Type of Home: House Home Access: Stairs to enter (1 step at friends house, Ramp at pts house)   Secretary/administrator of Steps: 1   Home Layout: One level Home Equipment: BSC/3in1 (Needs new RW)      Prior Function Prior Level of Function : Independent/Modified Independent             Mobility Comments: used RW PTA per pt       Extremity/Trunk Assessment   Upper Extremity Assessment Upper Extremity Assessment: Defer to OT evaluation    Lower Extremity Assessment Lower Extremity Assessment: LLE deficits/detail;RLE deficits/detail RLE Deficits / Details: grossly WNL RLE Sensation: history of peripheral neuropathy;decreased light touch LLE Deficits / Details: grossly WFL with ankle DF 4-/5 LLE Sensation: decreased light touch;history of peripheral neuropathy    Cervical / Trunk Assessment Cervical / Trunk Assessment: Kyphotic;Neck Surgery  Communication   Communication Communication: No apparent difficulties    Cognition Arousal: Alert Behavior During Therapy: WFL for tasks assessed/performed   PT - Cognitive impairments: No apparent impairments                         Following commands: Intact       Cueing       General Comments      Exercises     Assessment/Plan    PT Assessment Patient needs continued PT services  PT Problem List Decreased activity tolerance;Decreased balance;Decreased mobility;Decreased strength;Decreased knowledge of use of DME;Decreased safety awareness;Decreased knowledge of precautions;Pain       PT Treatment Interventions DME instruction;Gait training;Functional mobility training;Therapeutic activities;Therapeutic exercise;Balance training;Patient/family education    PT Goals (Current goals can be found in the Care Plan section)  Acute Rehab PT Goals Patient Stated Goal: to go home PT Goal Formulation: With patient Time For Goal  Achievement: 07/23/23 Potential to Achieve Goals: Good    Frequency Min 3X/week     Co-evaluation               AM-PAC PT "6 Clicks" Mobility  Outcome Measure Help needed turning from your back to your side while in a flat bed without using bedrails?: A Little Help needed moving from lying on your back to sitting on the side of a flat bed without using bedrails?: A Little Help needed moving to and from a bed to a chair (including a wheelchair)?: A Little Help needed standing up from a chair using your arms (e.g., wheelchair or bedside chair)?: A Little Help needed to walk in hospital room?: A Little Help needed climbing 3-5 steps with a railing? : A Little 6 Click Score: 18    End of Session Equipment Utilized During Treatment: Gait belt;Cervical collar Activity Tolerance: Patient limited by fatigue Patient left: in chair;with call bell/phone within reach;with family/visitor present Nurse Communication: Mobility status PT Visit Diagnosis: Muscle weakness (generalized) (M62.81);Pain Pain - Right/Left: Right Pain - part of body: Shoulder (back and neck)    Time: 1610-9604 PT Time Calculation (min) (ACUTE ONLY): 28 min   Charges:   PT Evaluation $PT Eval Moderate Complexity: 1 Mod PT Treatments $Gait Training: 8-22 mins PT General Charges $$ ACUTE PT VISIT: 1 Visit  [facility] M,PT Acute Rehab Services [phone]   Florencia Hunter 07/09/2023, 9:54 AM

## 2023-07-09 NOTE — Evaluation (Signed)
 Occupational Therapy Evaluation Patient Details Name: [name] MRN: [id number] DOB: 05/12/1960 Today's Date: 07/09/2023   History of Present Illness   Pt admitted 4/23 for C4-5 Anterior cervical discectomy/decompression;  C4-5 interbody arthrodesis with local morcellized autograft bone and Zimmer DBM; insertion of interbody prosthesis at  C4-5 ( [employer]ker titanium interbody prosthesis); anterior cervical plating from C4-5 with  Stryker titanium plate; exploration of cervical fusion /removal of cervical plate at Y3-0.PMH: cervical surgery, left TKA recently, arthritis, DM     Clinical Impressions This 63 yo female admitted and underwent above presents to acute OT with PLOF of being at a Mod I to independent level with basic ADLs and mobility. Currently she is setup-Mod A for basic ADLs and S for ambulation with RW. She will continue to benefit from acute OT with follow up OPOT.     If plan is discharge home, recommend the following:   A little help with walking and/or transfers;A lot of help with bathing/dressing/bathroom;Assist for transportation;Direct supervision/assist for medications management;Help with stairs or ramp for entrance;Assistance with cooking/housework     Functional Status Assessment   Patient has had a recent decline in their functional status and demonstrates the ability to make significant improvements in function in a reasonable and predictable amount of time.     Equipment Recommendations   None recommended by OT     Recommendations for Other Services   PT consult     Precautions/Restrictions   Precautions Precautions: Cervical Precaution Booklet Issued: Yes (comment) Recall of Precautions/Restrictions: Intact Required Braces or Orthoses: Cervical Brace Cervical Brace: Hard collar;At all times (off to shower) Restrictions Weight Bearing Restrictions Per Provider Order: No     Mobility Bed Mobility Overal bed mobility: Modified  Independent             General bed mobility comments: increased time and effort from, HOB up and use of rail; plans on being in a recliner    Transfers Overall transfer level: Needs assistance Equipment used: Rolling walker (2 wheels) Transfers: Sit to/from Stand Sit to Stand: Supervision           General transfer comment: No assist needed. Did cues for hand placement      Balance Overall balance assessment: Mild deficits observed, not formally tested Sitting-balance support: No upper extremity supported, Feet supported Sitting balance-Leahy Scale: Good     Standing balance support: During functional activity, Single extremity supported Standing balance-Leahy Scale: Poor                             ADL either performed or assessed with clinical judgement   ADL Overall ADL's : Needs assistance/impaired Eating/Feeding: Modified independent;Sitting Eating/Feeding Details (indicate cue type and reason): Educated on using washcloth between chin and collar to protect collar pads Grooming: Modified independent;Sitting;Standing Grooming Details (indicate cue type and reason): Educated on using washcloth between chin and collar to protect collar pads Upper Body Bathing: Minimal assistance Upper Body Bathing Details (indicate cue type and reason): S sit<>stand Lower Body Bathing: Supervison/ safety;Sit to/from stand   Upper Body Dressing : Moderate assistance;Sitting Upper Body Dressing Details (indicate cue type and reason): pullover shirt, head first then arms Lower Body Dressing: Set up;Supervision/safety;Sit to/from stand   Toilet Transfer: Supervision/safety;Ambulation;Rolling walker (2 wheels)   Toileting- Clothing Manipulation and Hygiene: Moderate assistance Toileting - Clothing Manipulation Details (indicate cue type and reason): S sit<>stand  Vision Patient Visual Report: No change from baseline              Pertinent  Vitals/Pain Pain Assessment Pain Assessment: Faces Faces Pain Scale: Hurts little more Pain Location: neck and back Pain Descriptors / Indicators: Aching, Discomfort Pain Intervention(s): Limited activity within patient's tolerance, Monitored during session, Repositioned     Extremity/Trunk Assessment Upper Extremity Assessment Upper Extremity Assessment: Right hand dominant;RUE deficits/detail RUE Deficits / Details: Decreased AROM in shoulder (1+/5) elbow flexion (2/5), rest (3/5) RUE Coordination: decreased gross motor      Cervical / Trunk Assessment Cervical / Trunk Assessment: Kyphotic;Neck Surgery   Communication Communication Communication: No apparent difficulties                 Home Living Family/patient expects to be discharged to:: Private residence Living Arrangements: Alone Available Help at Discharge: Friend(s);Available 24 hours/day (initially staying with friend) Type of Home: House Home Access: Stairs to enter (1 step at friends house, Ramp at pts house) Secretary/administrator of Steps: 1   Home Layout: One level     Bathroom Shower/Tub: Producer, television/film/video: Handicapped height     Home Equipment: BSC/3in1;Shower seat - built in;Hand held shower head          Prior Functioning/Environment Prior Level of Function : Independent/Modified Independent             Mobility Comments: used RW PTA per pt      OT Problem List: Decreased strength;Decreased range of motion;Impaired balance (sitting and/or standing);Pain;Impaired UE functional use   OT Treatment/Interventions: Self-care/ADL training;Therapeutic exercise;Patient/family education;Balance training      OT Goals(Current goals can be found in the care plan section)   Acute Rehab OT Goals Patient Stated Goal: for RUE to work better OT Goal Formulation: With patient Time For Goal Achievement: 07/23/23 Potential to Achieve Goals: Good   OT Frequency:  Min 2X/week        AM-PAC OT "6 Clicks" Daily Activity     Outcome Measure Help from another person eating meals?: None Help from another person taking care of personal grooming?: None Help from another person toileting, which includes using toliet, bedpan, or urinal?: A Lot Help from another person bathing (including washing, rinsing, drying)?: A Lot Help from another person to put on and taking off regular upper body clothing?: A Lot Help from another person to put on and taking off regular lower body clothing?: A Little 6 Click Score: 17   End of Session Equipment Utilized During Treatment: Rolling walker (2 wheels);Cervical collar Nurse Communication: Mobility status (needs to stay one more night due to RUE)  Activity Tolerance: Patient tolerated treatment well Patient left: in chair;with call bell/phone within reach  OT Visit Diagnosis: Other abnormalities of gait and mobility (R26.89);Hemiplegia and hemiparesis;Pain Hemiplegia - Right/Left: Right Hemiplegia - dominant/non-dominant: Dominant Hemiplegia - caused by:  (neck sx (more proximal than distal)) Pain - Right/Left: Right Pain - part of body: Shoulder (neck)                Time: 1610-9604 OT Time Calculation (min): 23 min Charges:  OT General Charges $OT Visit: 1 Visit OT Evaluation $OT Eval Moderate Complexity: 1 Mod OT Treatments $Self Care/Home Management : 8-22 mins  Merryl Abraham OT Acute Rehabilitation Services Office [phone]    Lenox Raider 07/09/2023, 11:27 AM

## 2023-07-09 NOTE — Progress Notes (Signed)
 Occupational Therapy Treatment Patient Details Name: [name] MRN: [id number] DOB: [birth year] Today's Date: 07/09/2023   History of present illness Pt admitted 4/23 for C4-5 Anterior cervical discectomy/decompression;  C4-5 interbody arthrodesis with local morcellized autograft bone and Zimmer DBM; insertion of interbody prosthesis at  C4-5 ( [employer] titanium interbody prosthesis); anterior cervical plating from C4-5 with  Stryker titanium plate; exploration of cervical fusion /removal of cervical plate at E3-3.PMH: cervical surgery, left TKA recently, arthritis, DM   OT comments  This patient seen for a second time to focus on RUE. PT needs min VCs for some of them, has handouts to try and do them today and I will address any issues/questions she has about them tomorrow.      If plan is discharge home, recommend the following:  A little help with walking and/or transfers;A lot of help with bathing/dressing/bathroom;Assist for transportation;Direct supervision/assist for medications management;Help with stairs or ramp for entrance;Assistance with cooking/housework   Equipment Recommendations  None recommended by OT    Recommendations for Other Services PT consult    Precautions / Restrictions Precautions Precautions: Cervical Precaution Booklet Issued: Yes (comment) Recall of Precautions/Restrictions: Intact Required Braces or Orthoses: Cervical Brace Cervical Brace: Hard collar;At all times (off for shower and pt stated that Dr. Larrie Po said she could have it off to eat as well) Restrictions Weight Bearing Restrictions Per Provider Order: No          Extremity/Trunk Assessment Upper Extremity Assessment Upper Extremity Assessment: Right hand dominant RUE Deficits / Details: Decreased AROM in shoulder (1+/5) elbow flexion (2/5), rest (3/5) RUE Sensation: decreased light touch;decreased proprioception RUE Coordination: decreased gross motor            Vision  Patient Visual Report: No change from baseline           Communication Communication Communication: No apparent difficulties   Cognition Arousal: Alert Behavior During Therapy: WFL for tasks assessed/performed Cognition: No apparent impairments                               Following commands: Intact           Exercises Other Exercises Other Exercises: Pt given handouts and practiced FM and GM movements/activies with RUE.Pt given yellow theraputty            Pertinent Vitals/ Pain       Pain Assessment Pain Assessment: 0-10 Faces Pain Scale: Hurts little more Pain Location: neck and back Pain Descriptors / Indicators: Aching, Discomfort Pain Intervention(s): Monitored during session, Limited activity within patient's tolerance, Repositioned         Frequency  Min 2X/week        Progress Toward Goals  OT Goals(current goals can now be found in the care plan section)  Progress towards OT goals: Progressing toward goals (started RUE activities/exercises)  Acute Rehab OT Goals Patient Stated Goal: for RUE to work better OT Goal Formulation: With patient Time For Goal Achievement: 07/23/23 Potential to Achieve Goals: Good      AM-PAC OT "6 Clicks" Daily Activity     Outcome Measure   Help from another person eating meals?: None Help from another person taking care of personal grooming?: None Help from another person toileting, which includes using toliet, bedpan, or urinal?: A Lot Help from another person bathing (including washing, rinsing, drying)?: A Lot Help from another person to put on and taking off regular upper body clothing?:  A Lot Help from another person to put on and taking off regular lower body clothing?: A Little 6 Click Score: 17    End of Session Equipment Utilized During Treatment: Cervical collar  OT Visit Diagnosis: Other abnormalities of gait and mobility (R26.89);Hemiplegia and hemiparesis;Pain Hemiplegia -  Right/Left: Right Hemiplegia - dominant/non-dominant: Dominant Hemiplegia - caused by:  (neck sx (more proximal than distal)) Pain - Right/Left: Right Pain - part of body: Shoulder (neck)   Activity Tolerance Patient tolerated treatment well   Patient Left in chair;with call bell/phone within reach   Nurse Communication Mobility status (needs to stay one more night due to RUE)        Time: 1610-9604 OT Time Calculation (min): 30 min  Charges: OT General Charges $OT Visit: 1 Visit OT Evaluation $OT Eval Moderate Complexity: 1 Mod OT Treatments $Self Care/Home Management : 8-22 mins $Therapeutic Exercise: 23-37 mins  Merryl Abraham OT Acute Rehabilitation Services Office [phone]    Lenox Raider 07/09/2023, 2:45 PM

## 2023-07-09 NOTE — Progress Notes (Signed)
 Subjective: The patient is alert and pleasant.  Her daughter is at the bedside.  The patient has significant right deltoid weakness.  Objective: Vital signs in last 24 hours: Temp:  [97.5 F (36.4 C)-99 F (37.2 C)] 98.5 F (36.9 C) (04/24 0829) Pulse Rate:  [54-80] 54 (04/24 0829) Resp:  [10-28] 16 (04/24 0829) BP: (103-137)/(48-77) 103/53 (04/24 0829) SpO2:  [91 %-100 %] 93 % (04/24 0829) Estimated body mass index is 33.47 kg/m as calculated from the following:   Height as of this encounter: 5\' 2"  (1.575 m).   Weight as of this encounter: 83 kg.   Intake/Output from previous day: 04/23 0701 - 04/24 0700 In: 1100 [I.V.:1000; IV Piggyback:100] Out: 25 [Blood:25] Intake/Output this shift: No intake/output data recorded.  Physical exam patient is alert and pleasant.  She looks well.  Her dressing is clean and dry.  There is no hematoma or shift.  She has weakness in her right deltoid at 0 or 5 and mild right bicep weakness at 4+5.  Lab Results: No results for input(s): "WBC", "HGB", "HCT", "PLT" in the last 72 hours. BMET No results for input(s): "NA", "K", "CL", "CO2", "GLUCOSE", "BUN", "CREATININE", "CALCIUM" in the last 72 hours.  Studies/Results: DG Cervical Spine 1 View Result Date: 07/08/2023 CLINICAL DATA:  Elective surgery. EXAM: DG CERVICAL SPINE - 1 VIEW COMPARISON:  Preoperative imaging FINDINGS: Portable cross-table lateral view of the cervical spine submitted. Anterior fusion hardware C4-C5 with interbody spacer. There previous C5-C6 fusion hardware is been removed. C5-C6 pacer remains in place. IMPRESSION: Intraoperative spot view during cervical spine surgery. Electronically Signed   By: Chadwick Colonel M.D.   On: 07/08/2023 16:34    Assessment/Plan: Status post C4-5 anterior cervicectomy fusion plating, right C5 neuropraxia: I have discussed the situation with the patient and her daughter.  I have told them that this weakness should resolve in time.  We discussed  range of motion exercises to prevent frozen shoulder while we are awaiting the return of her strength.  We will plan to send her home tomorrow after she has had another chance to work with physical therapy.  I have answered all the questions.  LOS: 0 days     Elder Greening 07/09/2023, 11:04 AM     Patient ID: [name], female   DOB: [birth year], 63 y.o.   MRN: [id number]

## 2023-07-10 ENCOUNTER — Encounter (HOSPITAL_COMMUNITY): Payer: Self-pay | Admitting: Neurosurgery

## 2023-07-10 DIAGNOSIS — M4802 Spinal stenosis, cervical region: Secondary | ICD-10-CM | POA: Diagnosis not present

## 2023-07-10 LAB — GLUCOSE, CAPILLARY: Glucose-Capillary: 86 mg/dL (ref 70–99)

## 2023-07-10 MED ORDER — CYCLOBENZAPRINE HCL 10 MG PO TABS: 10.0000 mg | ORAL_TABLET | Freq: Three times a day (TID) | ORAL | 0 refills | Status: DC | PRN

## 2023-07-10 MED ORDER — OXYCODONE-ACETAMINOPHEN 5-325 MG PO TABS
1.0000 | ORAL_TABLET | ORAL | Status: DC | PRN
  Administered 2023-07-10: 1 via ORAL
  Filled 2023-07-10: qty 1

## 2023-07-10 MED ORDER — METHYLPREDNISOLONE 4 MG PO TBPK: ORAL_TABLET | ORAL | 0 refills | Status: DC

## 2023-07-10 MED ORDER — OXYCODONE-ACETAMINOPHEN 5-325 MG PO TABS
1.0000 | ORAL_TABLET | ORAL | 0 refills | Status: DC | PRN
Start: 2023-07-10 — End: 2023-11-05

## 2023-07-10 NOTE — Discharge Instructions (Signed)

## 2023-07-10 NOTE — Progress Notes (Signed)
 Occupational Therapy Treatment Patient Details Name: [name] MRN: [id number] DOB: [birth year] Today's Date: 07/10/2023   History of present illness Pt admitted 4/23 for C4-5 Anterior cervical discectomy/decompression;  C4-5 interbody arthrodesis with local morcellized autograft bone and Zimmer DBM; insertion of interbody prosthesis at  C4-5 ( [employer]ryker titanium interbody prosthesis); anterior cervical plating from C4-5 with  Stryker titanium plate; exploration of cervical fusion /removal of cervical plate at F6-4.PMH: cervical surgery, left TKA recently, arthritis, DM   OT comments  This 63 yo female seen today to see how RUE was doing as well as to do dressing again. RUE remains the same as yesterday with MD/PA going to try steroids to see if this will help and then if not get OPOT. Dressing is still hard for her due to decreased use of RUE but she was able to get her shirt on with min A today. She is to D/C home today with a friend, so we will D/C from acute OT.      If plan is discharge home, recommend the following:  A little help with walking and/or transfers;A lot of help with bathing/dressing/bathroom;Assist for transportation;Help with stairs or ramp for entrance;Assistance with cooking/housework   Equipment Recommendations  None recommended by OT    Recommendations for Other Services PT consult    Precautions / Restrictions Precautions Precautions: Cervical;Fall Precaution Booklet Issued: Yes (comment) Recall of Precautions/Restrictions: Intact Precaution/Restrictions Comments: needs cues to recall precautions Required Braces or Orthoses: Cervical Brace Cervical Brace: Hard collar (apply in sitting; May remove brace to shower and may remove when in bed) Restrictions Weight Bearing Restrictions Per Provider Order: No       Mobility Bed Mobility               General bed mobility comments: Pt sitting EOB upon arrival and at end of session    Transfers Overall  transfer level: Needs assistance Equipment used: Rolling walker (2 wheels) Transfers: Sit to/from Stand Sit to Stand: Contact guard assist           General transfer comment: posterior lean     Balance Overall balance assessment: Needs assistance Sitting-balance support: No upper extremity supported Sitting balance-Leahy Scale: Good     Standing balance support: Bilateral upper extremity supported, During functional activity, Reliant on assistive device for balance Standing balance-Leahy Scale: Poor Standing balance comment: reliant on RW                           ADL either performed or assessed with clinical judgement   ADL Overall ADL's : Needs assistance/impaired                 Upper Body Dressing : Minimal assistance;Sitting Upper Body Dressing Details (indicate cue type and reason): increased time to do pullover shirt, pt tried a zip up robe but this was just as hard. Pt able to take her c-collar off and re-apply with increased effort (only unhooking on left side) Lower Body Dressing: Set up Lower Body Dressing Details (indicate cue type and reason): CGA sit<>stand Toilet Transfer: Supervision/safety;Ambulation;Rolling walker (2 wheels) Toilet Transfer Details (indicate cue type and reason): simulated bed>around to recliner on other side of bed>sit down                Extremity/Trunk Assessment Upper Extremity Assessment Upper Extremity Assessment: Right hand dominant;RUE deficits/detail RUE Deficits / Details: Pt remains with weakness that she had yesterday in RUE, no change.  RUE Sensation: decreased light touch;decreased proprioception RUE Coordination: decreased gross motor            Vision Patient Visual Report: No change from baseline           Communication Communication Communication: No apparent difficulties   Cognition Arousal: Alert Behavior During Therapy: WFL for tasks assessed/performed Cognition: No apparent  impairments                               Following commands: Intact        Cueing   Cueing Techniques: Verbal cues  Exercises Other Exercises Other Exercises: Pt reports not issues or questions about the activities and exercises I gave her yesterday. Today I added wall walking, sliding her arm across and back on table top, and using her LUE to raise her RUE to ~30 degrees of shoulder flexion and hold for 3 seconds then slowly lower.       General Comments educated pt on car transfers, mobility frequency, and precautions. She verbalized understanding.    Pertinent Vitals/ Pain       Pain Assessment Pain Assessment: Faces Faces Pain Scale: Hurts a little bit Pain Location: R shoulder Pain Descriptors / Indicators: Discomfort Pain Intervention(s): Limited activity within patient's tolerance, Monitored during session, Repositioned         Frequency  Min 2X/week        Progress Toward Goals  OT Goals(current goals can now be found in the care plan section)  Progress towards OT goals: Progressing toward goals  Acute Rehab OT Goals Patient Stated Goal: for RUE to work better OT Goal Formulation: With patient Time For Goal Achievement: 07/23/23 Potential to Achieve Goals: Good         AM-PAC OT "6 Clicks" Daily Activity     Outcome Measure   Help from another person eating meals?: None Help from another person taking care of personal grooming?: None Help from another person toileting, which includes using toliet, bedpan, or urinal?: A Little Help from another person bathing (including washing, rinsing, drying)?: A Little Help from another person to put on and taking off regular upper body clothing?: A Little Help from another person to put on and taking off regular lower body clothing?: A Little 6 Click Score: 20    End of Session Equipment Utilized During Treatment: Cervical collar  OT Visit Diagnosis: Other abnormalities of gait and mobility  (R26.89);Hemiplegia and hemiparesis;Pain Hemiplegia - Right/Left: Right Hemiplegia - dominant/non-dominant: Dominant Hemiplegia - caused by:  (neck sx (more proximal than distal)) Pain - Right/Left: Right Pain - part of body: Shoulder   Activity Tolerance Patient tolerated treatment well   Patient Left in chair;with call bell/phone within reach   Nurse Communication  (pt ready to go from OT standpoint)        Time: 2130-8657 OT Time Calculation (min): 17 min  Charges: OT General Charges $OT Visit: 1 Visit OT Treatments $Self Care/Home Management : 8-22 mins  Merryl Abraham OT Acute Rehabilitation Services Office [phone]    Lenox Raider 07/10/2023, 11:52 AM

## 2023-07-10 NOTE — Discharge Summary (Signed)
 Physician Discharge Summary     Providing Compassionate, Quality Care - Together   Patient ID: [name] MRN: [id number] DOB/AGE: 62-Oct-1962 62 y.o.  Admit date: 07/08/2023 Discharge date: 07/10/2023  Admission Diagnoses: Spinal stenosis of cervical region  Discharge Diagnoses:  Principal Problem:   Spinal stenosis of cervical region   Discharged Condition: good  Hospital Course: Patient underwent a C4-5 ACDF by Dr. Larrie Po on 07/08/2023. She was admitted to 3C05 following recovery from anesthesia in the PACU. Her postoperative course was complicated by right deltoid weakness. She has worked with both physical and occupational therapies who feel the patient is ready for discharge home. She is ambulating independently with the aid of a walker. She is tolerating a normal diet. She is not having any bowel or bladder dysfunction. Her pain is well-controlled with oral pain medication. She is ready for discharge home.   Consults: PT/OT/TOC  Significant Diagnostic Studies: radiology: DG Cervical Spine 1 View Result Date: 07/08/2023 CLINICAL DATA:  Elective surgery. EXAM: DG CERVICAL SPINE - 1 VIEW COMPARISON:  Preoperative imaging FINDINGS: Portable cross-table lateral view of the cervical spine submitted. Anterior fusion hardware C4-C5 with interbody spacer. There previous C5-C6 fusion hardware is been removed. C5-C6 pacer remains in place. IMPRESSION: Intraoperative spot view during cervical spine surgery. Electronically Signed   By: Chadwick Colonel M.D.   On: 07/08/2023 16:34     Treatments: surgery: C4-5 Anterior cervical discectomy/decompression;  C4-5 interbody arthrodesis with local morcellized autograft bone and Zimmer DBM; insertion of interbody prosthesis at  C4-5 ( [employer] titanium interbody prosthesis); anterior cervical plating from C4-5 with  [employer]ryker titanium plate; exploration of cervical fusion /removal of cervical plate at V7-8   Discharge Exam: Blood pressure (!)  128/55, pulse 64, temperature 98.8 F (37.1 C), temperature source Oral, resp. rate 19, height 5\' 2"  (1.575 m), weight 83 kg, last menstrual period 02/14/2005, SpO2 100%.  Per report: Alert and oriented x 4 PERRLA CN II-XII grossly intact MAE, Strength and sensation intact aside from right deltoid 2/5 strength Incision is covered with Honeycomb dressing and Steri Strips; Dressing is clean, dry, and intact   Disposition: Discharge disposition: 01-Home or Self Care       Discharge Instructions     Call MD for:  difficulty breathing, headache or visual disturbances   Complete by: As directed    Call MD for:  hives   Complete by: As directed    Call MD for:  persistant dizziness or light-headedness   Complete by: As directed    Call MD for:  persistant nausea and vomiting   Complete by: As directed    Call MD for:  redness, tenderness, or signs of infection (pain, swelling, redness, odor or green/yellow discharge around incision site)   Complete by: As directed    Call MD for:  severe uncontrolled pain   Complete by: As directed    Diet - low sodium heart healthy   Complete by: As directed    If the dressing is still on your incision site when you go home, remove it on the third day after your surgery date. Remove dressing if it begins to fall off, or if it is dirty or damaged before the third day.   Complete by: As directed    Increase activity slowly   Complete by: As directed       Allergies as of 07/10/2023       Reactions   Jardiance [empagliflozin] Other (See Comments)   Diabetic ketoacidosis  Penicillins Hives        Medication List     STOP taking these medications    HYDROcodone -acetaminophen  5-325 MG tablet Commonly known as: NORCO/VICODIN       TAKE these medications    acetaminophen  500 MG tablet Commonly known as: TYLENOL  Take 1,000 mg by mouth every 6 (six) hours as needed for moderate pain (pain score 4-6).   BARIATRIC MULTIVITAMINS/IRON  PO Take 1 tablet by mouth daily.   blood glucose meter kit and supplies Dispense based on patient and insurance preference. Use up to four times daily as directed. (FOR ICD-10 E10.9, E11.9).   celecoxib 200 MG capsule Commonly known as: CELEBREX Take 200 mg by mouth daily.   cetirizine 10 MG tablet Commonly known as: ZYRTEC Take 10 mg by mouth daily.   [employer] CALCIUM/VIT D SOFT CHEWS PO Take 2 each by mouth daily.   cyclobenzaprine  10 MG tablet Commonly known as: FLEXERIL  Take 1 tablet (10 mg total) by mouth 3 (three) times daily as needed for muscle spasms.   diclofenac Sodium 1 % Gel Commonly known as: VOLTAREN Apply 2 g topically daily as needed (pain).   docusate sodium  100 MG capsule Commonly known as: COLACE Take 100 mg by mouth daily as needed for moderate constipation.   DULoxetine  60 MG capsule Commonly known as: CYMBALTA  Take 60 mg by mouth daily as needed (neuropathy).   gabapentin  100 MG capsule Commonly known as: NEURONTIN  Take 100 mg by mouth 2 (two) times daily as needed (pain).   methylPREDNISolone 4 MG Tbpk tablet Commonly known as: MEDROL DOSEPAK follow package directions   montelukast  10 MG tablet Commonly known as: SINGULAIR  Take 10 mg by mouth at bedtime.   Mounjaro 5 MG/0.5ML Pen Generic drug: tirzepatide Inject 5 mg into the skin once a week.   oxyCODONE -acetaminophen  5-325 MG tablet Commonly known as: PERCOCET/ROXICET Take 1-2 tablets by mouth every 4 (four) hours as needed for moderate pain (pain score 4-6).   Pen Needles 3/16" 31G X 5 MM Misc To use with Lantus  pen.               Discharge Care Instructions  (From admission, onward)           Start     Ordered   07/10/23 0000  If the dressing is still on your incision site when you go home, remove it on the third day after your surgery date. Remove dressing if it begins to fall off, or if it is dirty or damaged before the third day.        07/10/23 0908             Follow-up Information     Garry Kansas, MD Follow up.   Specialty: Neurosurgery Contact information: [address] [place] [address] [phone]                 Signed: Henreitta Locus, DNP, AGNP-C Nurse Practitioner  [facility] [address], [address], [place], [place] [address] P: [phone]    F: [phone]  07/10/2023, 9:09 AM

## 2023-07-10 NOTE — Progress Notes (Signed)
 Subjective: The patient is alert and pleasant.  She is in no apparent distress.  Her right deltoid continues to be weak.  Objective: Vital signs in last 24 hours: Temp:  [98.1 F (36.7 C)-98.8 F (37.1 C)] 98.1 F (36.7 C) (04/25 0521) Pulse Rate:  [54-79] 79 (04/25 0521) Resp:  [16-20] 20 (04/25 0521) BP: (103-129)/(45-71) 119/45 (04/25 0521) SpO2:  [93 %-100 %] 100 % (04/25 0521) Estimated body mass index is 33.47 kg/m as calculated from the following:   Height as of this encounter: 5\' 2"  (1.575 m).   Weight as of this encounter: 83 kg.   Intake/Output from previous day: 04/24 0701 - 04/25 0700 In: 240 [P.O.:240] Out: -  Intake/Output this shift: No intake/output data recorded.  Physical exam the patient is alert and pleasant.  Her strength is normal except in her right deltoid is 0/5 in the right bicep is 4+/5.  Her dressing is clean and dry.  There is no hematoma or shift.  Lab Results: No results for input(s): "WBC", "HGB", "HCT", "PLT" in the last 72 hours. BMET No results for input(s): "NA", "K", "CL", "CO2", "GLUCOSE", "BUN", "CREATININE", "CALCIUM" in the last 72 hours.  Studies/Results: DG Cervical Spine 1 View Result Date: 07/08/2023 CLINICAL DATA:  Elective surgery. EXAM: DG CERVICAL SPINE - 1 VIEW COMPARISON:  Preoperative imaging FINDINGS: Portable cross-table lateral view of the cervical spine submitted. Anterior fusion hardware C4-C5 with interbody spacer. There previous C5-C6 fusion hardware is been removed. C5-C6 pacer remains in place. IMPRESSION: Intraoperative spot view during cervical spine surgery. Electronically Signed   By: Chadwick Colonel M.D.   On: 07/08/2023 16:34    Assessment/Plan: Postop day #2 status post C4-5 anterior cervicectomy fusion plating for severe stenosis, right C5 neuropraxia, the patient has been instructed on range of motion exercises.  We will plan to discharge her after work with physical therapy.  I gave her discharge  instructions and answered all her questions.  LOS: 0 days     [name] 07/10/2023, 6:47 AM     Patient ID: [name], female   DOB: 02-21-1961, 63 y.o.   MRN: [id number]

## 2023-07-10 NOTE — Progress Notes (Signed)
 Patient alert and oriented, ambulate, and void. D/c instructions explain and given all questions answered. Surgical site clean and dry no sign of infection.

## 2023-07-10 NOTE — Progress Notes (Signed)
 Physical Therapy Treatment Patient Details Name: [name] MRN: [id number] DOB: 1960/11/13 Today's Date: 07/10/2023   History of Present Illness Pt admitted 4/23 for C4-5 Anterior cervical discectomy/decompression;  C4-5 interbody arthrodesis with local morcellized autograft bone and Zimmer DBM; insertion of interbody prosthesis at  C4-5 ( [employer] titanium interbody prosthesis); anterior cervical plating from C4-5 with  [employer]ryker titanium plate; exploration of cervical fusion /removal of cervical plate at W0-9.PMH: cervical surgery, left TKA recently, arthritis, DM    PT Comments  The pt demonstrated improved activity tolerance by not flexing forward as much as she ambulated further today. She does still require cues for improving her upright posture though. Educated pt on car transfers, mobility frequency, and precautions. She verbalized understanding. Will continue to follow acutely.   If plan is discharge home, recommend the following: A little help with walking and/or transfers;Assistance with cooking/housework;Assist for transportation;Help with stairs or ramp for entrance   Can travel by private vehicle        Equipment Recommendations  Rolling walker (2 wheels) (needs youth RW)    Recommendations for Other Services       Precautions / Restrictions Precautions Precautions: Cervical;Fall Precaution Booklet Issued: Yes (comment) Recall of Precautions/Restrictions: Intact Precaution/Restrictions Comments: needs cues to recall precautions Required Braces or Orthoses: Cervical Brace Cervical Brace: Hard collar (apply in sitting; May remove brace to shower and may remove when in bed) Restrictions Weight Bearing Restrictions Per Provider Order: No     Mobility  Bed Mobility               General bed mobility comments: Pt sitting EOB upon arrival and at end of session    Transfers Overall transfer level: Needs assistance Equipment used: Rolling walker (2  wheels) Transfers: Sit to/from Stand Sit to Stand: Supervision           General transfer comment: No assist needed. Supervision for safety standing from EOB    Ambulation/Gait Ambulation/Gait assistance: Contact guard assist Gait Distance (Feet): 200 Feet Assistive device: Rolling walker (2 wheels) Gait Pattern/deviations: Step-through pattern, Decreased step length - right, Decreased step length - left, Decreased stride length, Decreased dorsiflexion - left, Antalgic, Trunk flexed, Wide base of support Gait velocity: reduced Gait velocity interpretation: <1.8 ft/sec, indicate of risk for recurrent falls   General Gait Details: Tactile and verbal cues provided for improved upright posture and looking forward but pt seemed to do better staying proximal to RW and did not slump forward more as she fatigued today. No LOB, CGA for safety   Stairs             Wheelchair Mobility     Tilt Bed    Modified Rankin (Stroke Patients Only)       Balance Overall balance assessment: Needs assistance Sitting-balance support: No upper extremity supported, Feet supported Sitting balance-Leahy Scale: Fair     Standing balance support: Bilateral upper extremity supported, During functional activity, Reliant on assistive device for balance Standing balance-Leahy Scale: Poor Standing balance comment: reliant on RW                            Communication Communication Communication: No apparent difficulties  Cognition Arousal: Alert Behavior During Therapy: WFL for tasks assessed/performed   PT - Cognitive impairments: No apparent impairments                         Following commands:  Intact      Cueing Cueing Techniques: Verbal cues  Exercises      General Comments General comments (skin integrity, edema, etc.): educated pt on car transfers, mobility frequency, and precautions. She verbalized understanding.      Pertinent Vitals/Pain Pain  Assessment Pain Assessment: Faces Faces Pain Scale: Hurts a little bit Pain Location: R shoulder Pain Descriptors / Indicators: Discomfort Pain Intervention(s): Limited activity within patient's tolerance, Monitored during session, Repositioned    Home Living                          Prior Function            PT Goals (current goals can now be found in the care plan section) Acute Rehab PT Goals Patient Stated Goal: to go home PT Goal Formulation: With patient Time For Goal Achievement: 07/23/23 Potential to Achieve Goals: Good Progress towards PT goals: Progressing toward goals    Frequency    Min 3X/week      PT Plan      Co-evaluation              AM-PAC PT "6 Clicks" Mobility   Outcome Measure  Help needed turning from your back to your side while in a flat bed without using bedrails?: A Little Help needed moving from lying on your back to sitting on the side of a flat bed without using bedrails?: A Little Help needed moving to and from a bed to a chair (including a wheelchair)?: A Little Help needed standing up from a chair using your arms (e.g., wheelchair or bedside chair)?: A Little Help needed to walk in hospital room?: A Little Help needed climbing 3-5 steps with a railing? : A Little 6 Click Score: 18    End of Session Equipment Utilized During Treatment: Cervical collar Activity Tolerance: Patient tolerated treatment well Patient left: with call bell/phone within reach;in bed Nurse Communication: Mobility status PT Visit Diagnosis: Muscle weakness (generalized) (M62.81);Pain Pain - Right/Left: Right Pain - part of body: Shoulder (back and neck)     Time: 5621-3086 PT Time Calculation (min) (ACUTE ONLY): 11 min  Charges:    $Gait Training: 8-22 mins PT General Charges $$ ACUTE PT VISIT: 1 Visit                     Vernida Goodie, PT, DPT Acute Rehabilitation Services  Office: [phone]    Ellyn Hack 07/10/2023,  9:11 AM

## 2023-09-04 ENCOUNTER — Encounter (HOSPITAL_BASED_OUTPATIENT_CLINIC_OR_DEPARTMENT_OTHER): Payer: Self-pay

## 2023-09-04 ENCOUNTER — Ambulatory Visit (HOSPITAL_BASED_OUTPATIENT_CLINIC_OR_DEPARTMENT_OTHER)
Admission: EM | Admit: 2023-09-04 | Discharge: 2023-09-04 | Disposition: A | Discharge status: Home / Self Care | Attending: Family Medicine | Admitting: Family Medicine

## 2023-09-04 DIAGNOSIS — S0181XA Laceration without foreign body of other part of head, initial encounter: Secondary | ICD-10-CM

## 2023-09-04 NOTE — Discharge Instructions (Signed)
 We have put some glue on the eyebrow. Keep area dry for next 24 hours. You can get wet in the shower after this but do not scrub the area or submerge in water. The glue should come off on its own when ready.  Follow up as needed.

## 2023-09-04 NOTE — ED Provider Notes (Signed)
 [name] CARE    CSN: [id number] Arrival date & time: 09/04/23  1239      History   Chief Complaint Chief Complaint  Patient presents with   Laceration    HPI [name] is a 63 y.o. female.   Pt is a 63 year old female that presents with laceration to the left eyebrow. Reports that she tripped and landed striking her face on the tile floor. Denies LOC. She has headache to the area of the laceration with swelling. She has some swelling to the left facial area. Denies any dizziness, blurred visio, N,V.  Still bleeding some.    Laceration   Past Medical History:  Diagnosis Date   Arthritis 03/05/2010   Complication of anesthesia    Diabetes mellitus 04/17/2008   Gallstones 05/31/2011   History of kidney stones    Numbness in both hands parts of both hands   all the time since cervical fusion   Numbness in feet    numbness parts of both feet since cervical fusion   Obesity    PONV (postoperative nausea and vomiting)     Patient Active Problem List   Diagnosis Date Noted   Spinal stenosis of cervical region 07/08/2023   DKA (diabetic ketoacidosis) (HCC) 11/19/2020   Nausea & vomiting 11/19/2020   Dehydration 11/19/2020   Leukocytosis 11/19/2020   GERD (gastroesophageal reflux disease) 11/19/2020   Allergic rhinitis 11/19/2020   High anion gap metabolic acidosis 11/19/2020   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   Postoperative nausea and vomiting 07/07/2011    Past Surgical History:  Procedure Laterality Date    endometria lablation  02/14/2005   ANTERIOR CERVICAL DECOMP/DISCECTOMY FUSION N/A 07/08/2023   Procedure: CERVICAL FOUR-FIVE ANTERIOR CERVICAL DECOMPRESSION/DISCECTOMY FUSION WITH REMOVAL OF PREVIOUS ANTERIOR CERVICAL PLATE;  Surgeon: Garry Kansas, MD;  Location: [facility] OR;  Service: Neurosurgery;  Laterality: N/A;  ACDF,IP,PLATE/SCREWS F62; POSS REM OLD PLATE 3C   CERVICAL FUSION  04/17/2008   CHOLECYSTECTOMY  07/01/2011    Procedure: LAPAROSCOPIC CHOLECYSTECTOMY WITH INTRAOPERATIVE CHOLANGIOGRAM;  Surgeon: Andy Bannister A. Cornett, MD;  Location: [facility];  Service: General;  Laterality: N/A;   COLONOSCOPY WITH PROPOFOL  N/A 02/25/2021   Procedure: COLONOSCOPY WITH PROPOFOL ;  Surgeon: Janel Medford, MD;  Location: [facility];  Service: Endoscopy;  Laterality: N/A;   EXTRACORPOREAL SHOCK WAVE LITHOTRIPSY Left 12/19/2022   Procedure: LEFT EXTRACORPOREAL SHOCK WAVE LITHOTRIPSY (ESWL);  Surgeon: Lahoma Pigg, MD;  Location: [facility];  Service: Urology;  Laterality: Left;   HIATAL HERNIA REPAIR N/A 10/15/2020   Procedure: HERNIA REPAIR HIATAL;  Surgeon: Jacolyn Matar, MD;  Location: [facility];  Service: General;  Laterality: N/A;   KNEE SURGERY Left 03/17/2012   Torn meniscus   POLYPECTOMY  02/25/2021   Procedure: POLYPECTOMY;  Surgeon: Janel Medford, MD;  Location: [facility];  Service: Endoscopy;;   TOTAL KNEE ARTHROPLASTY Left 12/2022   UPPER GI ENDOSCOPY N/A 10/15/2020   Procedure: UPPER GI ENDOSCOPY;  Surgeon: Jacolyn Matar, MD;  Location: [facility];  Service: General;  Laterality: N/A;    OB History   No obstetric history on file.      Home Medications    Prior to Admission medications   Medication Sig Start Date End Date Taking? Authorizing Provider  acetaminophen  (TYLENOL ) 500 MG tablet Take 1,000 mg by mouth every 6 (six) hours as needed for moderate pain (pain score 4-6).    [provider]  blood glucose meter kit and supplies Dispense  based on patient and insurance preference. Use up to four times daily as directed. (FOR ICD-10 E10.9, E11.9). 11/22/20   Hoyt Macleod, MD  Calcium Carbonate-Vitamin D ([employer] CALCIUM/VIT D SOFT CHEWS PO) Take 2 each by mouth daily.    [provider]  celecoxib (CELEBREX) 200 MG capsule Take 200 mg by mouth daily. 12/10/20   [provider]  cetirizine (ZYRTEC) 10 MG tablet Take 10 mg by mouth daily.    [provider]   cyclobenzaprine  (FLEXERIL ) 10 MG tablet Take 1 tablet (10 mg total) by mouth 3 (three) times daily as needed for muscle spasms. 07/10/23   Bergman, Meghan D, NP  diclofenac Sodium (VOLTAREN) 1 % GEL Apply 2 g topically daily as needed (pain).    [provider]  docusate sodium  (COLACE) 100 MG capsule Take 100 mg by mouth daily as needed for moderate constipation.    [provider]  DULoxetine  (CYMBALTA ) 60 MG capsule Take 60 mg by mouth daily as needed (neuropathy).    [provider]  gabapentin  (NEURONTIN ) 100 MG capsule Take 100 mg by mouth 2 (two) times daily as needed (pain). 12/24/20   [provider]  Insulin  Pen Needle (PEN NEEDLES 3/16) 31G X 5 MM MISC To use with Lantus  pen. 11/22/20   Dahal, Aminta Baldy, MD  methylPREDNISolone  (MEDROL  DOSEPAK) 4 MG TBPK tablet follow package directions 07/10/23   Bergman, Meghan D, NP  montelukast  (SINGULAIR ) 10 MG tablet Take 10 mg by mouth at bedtime.    [provider]  MOUNJARO 5 MG/0.5ML Pen Inject 5 mg into the skin once a week. 05/29/23   [provider]  Multiple Vitamins-Minerals (BARIATRIC MULTIVITAMINS/IRON PO) Take 1 tablet by mouth daily.    [provider]  oxyCODONE -acetaminophen  (PERCOCET/ROXICET) 5-325 MG tablet Take 1-2 tablets by mouth every 4 (four) hours as needed for moderate pain (pain score 4-6). 07/10/23   Henreitta Locus D, NP    Family History Family History  Problem Relation Age of Onset   Stroke Father    Diabetes Father    Heart disease Father    Colon polyps Mother    Lung cancer Maternal Aunt    Diabetes Maternal Grandfather    Heart disease Maternal Grandfather    Liver cancer Maternal Aunt    Colon cancer Neg Hx     Social History Social History   Tobacco Use   Smoking status: Never   Smokeless tobacco: Never  Vaping Use   Vaping status: Never Used  Substance Use Topics   Alcohol use: No   Drug use: No     Allergies   Jardiance  [empagliflozin] and Penicillins   Review of Systems Review of Systems See HPI  Physical Exam Triage Vital Signs ED Triage Vitals  Encounter Vitals Group     BP 09/04/23 1258 (!) 141/84     Girls Systolic BP Percentile --      Girls Diastolic BP Percentile --      Boys Systolic BP Percentile --      Boys Diastolic BP Percentile --      Pulse Rate 09/04/23 1258 74     Resp 09/04/23 1258 20     Temp 09/04/23 1258 97.8 F (36.6 C)     Temp Source 09/04/23 1258 Oral     SpO2 09/04/23 1258 97 %     Weight --      Height --      Head Circumference --  Peak Flow --      Pain Score 09/04/23 1300 7     Pain Loc --      Pain Education --      Exclude from Growth Chart --    No data found.  Updated Vital Signs BP (!) 141/84 (BP Location: Right Arm)   Pulse 74   Temp 97.8 F (36.6 C) (Oral)   Resp 20   LMP 02/14/2005   SpO2 97%   Visual Acuity Right Eye Distance:   Left Eye Distance:   Bilateral Distance:    Right Eye Near:   Left Eye Near:    Bilateral Near:     Physical Exam Vitals and nursing note reviewed.  Constitutional:      General: She is not in acute distress.    Appearance: Normal appearance. She is not ill-appearing, toxic-appearing or diaphoretic.  HENT:     Head:   Pulmonary:     Effort: Pulmonary effort is normal.   Skin:    General: Skin is warm and dry.   Neurological:     General: No focal deficit present.     Mental Status: She is alert.   Psychiatric:        Mood and Affect: Mood normal.      UC Treatments / Results  Labs (all labs ordered are listed, but only abnormal results are displayed) Labs Reviewed - No data to display  EKG   Radiology No results found.  Procedures Laceration Repair  Date/Time: 09/04/2023 1:37 PM  Performed by: Landa Pine, FNP Authorized by: Landa Pine, FNP   Consent:    Consent obtained:  Verbal   Risks discussed:  Pain, need for additional repair and poor cosmetic result Universal  protocol:    Patient identity confirmed:  Verbally with patient Anesthesia:    Anesthesia method:  None Laceration details:    Location:  Face   Face location:  L eyebrow   Length (cm):  2 Exploration:    Hemostasis achieved with:  Direct pressure   Imaging outcome: foreign body not noted     Wound exploration: wound explored through full range of motion     Contaminated: no   Treatment:    Area cleansed with:  Saline   Amount of cleaning:  Standard   Irrigation solution:  Sterile saline   Irrigation method:  Pressure wash   Visualized foreign bodies/material removed: no     Debridement:  None   Undermining:  None   Scar revision: no   Skin repair:    Repair method:  Tissue adhesive Approximation:    Approximation:  Close Repair type:    Repair type:  Simple Post-procedure details:    Dressing:  Open (no dressing)   Procedure completion:  Tolerated well, no immediate complications  (including critical care time)  Medications Ordered in UC Medications - No data to display  Initial Impression / Assessment and Plan / UC Course  I have reviewed the triage vital signs and the nursing notes.  Pertinent labs & imaging results that were available during my care of the patient were reviewed by me and considered in my medical decision making (see chart for details).     Facial laceration- cleaned well and closed with derma bond. No neuro concerns at this time. Pt tolerated well. Instructions given on how to take care of the wound.  Rest, Ice the area.  F/U for any continued or worsening issues.  Final Clinical Impressions(s) /  UC Diagnoses   Final diagnoses:  Facial laceration, initial encounter     Discharge Instructions      We have put some glue on the eyebrow. Keep area dry for next 24 hours. You can get wet in the shower after this but do not scrub the area or submerge in water. The glue should come off on its own when ready.  Follow up as needed.     ED  Prescriptions   None    PDMP not reviewed this encounter.   Landa Pine, FNP 09/04/23 1338

## 2023-09-04 NOTE — ED Triage Notes (Signed)
 Laceration to corner of left eye. Bleeding controled. Tripped and struck head on bathroom tile floor. No LOC.

## 2023-09-11 ENCOUNTER — Other Ambulatory Visit: Payer: Self-pay | Admitting: Neurosurgery

## 2023-09-11 DIAGNOSIS — M4712 Other spondylosis with myelopathy, cervical region: Secondary | ICD-10-CM

## 2023-09-14 ENCOUNTER — Encounter: Payer: Self-pay | Admitting: Neurosurgery

## 2023-09-16 ENCOUNTER — Inpatient Hospital Stay
Admission: RE | Admit: 2023-09-16 | Discharge: 2023-09-16 | Discharge status: Home / Self Care | Source: Ambulatory Visit | Attending: Neurosurgery | Admitting: Neurosurgery

## 2023-09-16 DIAGNOSIS — M4712 Other spondylosis with myelopathy, cervical region: Secondary | ICD-10-CM

## 2023-09-17 ENCOUNTER — Other Ambulatory Visit

## 2023-11-05 ENCOUNTER — Encounter (HOSPITAL_COMMUNITY): Payer: Self-pay | Admitting: Urology

## 2023-11-05 ENCOUNTER — Other Ambulatory Visit: Payer: Self-pay | Admitting: Urology

## 2023-11-05 NOTE — Progress Notes (Signed)
 Pt called back after leaving 2 messages.  Pt is scheduled for ESWL at [facility] on 11-06-2023.  Completed medication list and medical history.  Pt stated last dose mounjaro 10-30-2023.  Called and spoke w/ Nathanel (manage) for [employer] she stated patient was okay to proceed with her litho tomorrow.  Last taken celebrex at 0730 today 11-05-2023.  Also, took dulcolax at 1600 today.  Pt has Dexcom on left arm.   To bring insurance card, photo ID , and blue folder dos.  Pt verbalized all instructions with further questions..  Driver/ caregiver, niece-- Therisa Louis [phone]

## 2023-11-06 ENCOUNTER — Other Ambulatory Visit: Payer: Self-pay

## 2023-11-06 ENCOUNTER — Ambulatory Visit (HOSPITAL_COMMUNITY)
Admission: RE | Admit: 2023-11-06 | Discharge: 2023-11-06 | Disposition: A | Discharge status: Home / Self Care | Attending: Urology | Admitting: Urology

## 2023-11-06 ENCOUNTER — Encounter (HOSPITAL_COMMUNITY): Payer: Self-pay | Admitting: Urology

## 2023-11-06 ENCOUNTER — Ambulatory Visit (HOSPITAL_COMMUNITY)

## 2023-11-06 ENCOUNTER — Encounter (HOSPITAL_COMMUNITY)
Admission: RE | Disposition: A | Discharge status: Home / Self Care | Payer: Self-pay | Source: Home / Self Care | Attending: Urology

## 2023-11-06 DIAGNOSIS — N201 Calculus of ureter: Secondary | ICD-10-CM | POA: Diagnosis present

## 2023-11-06 DIAGNOSIS — N202 Calculus of kidney with calculus of ureter: Secondary | ICD-10-CM | POA: Diagnosis not present

## 2023-11-06 HISTORY — PX: EXTRACORPOREAL SHOCK WAVE LITHOTRIPSY: SHX1557

## 2023-11-06 LAB — GLUCOSE, CAPILLARY: Glucose-Capillary: 93 mg/dL (ref 70–99)

## 2023-11-06 SURGERY — LITHOTRIPSY, ESWL
Anesthesia: LOCAL | Laterality: Left

## 2023-11-06 MED ORDER — DIAZEPAM 5 MG PO TABS
10.0000 mg | ORAL_TABLET | ORAL | Status: DC
  Filled 2023-11-06: qty 2

## 2023-11-06 MED ORDER — SODIUM CHLORIDE 0.9 % IV SOLN: INTRAVENOUS | Status: DC

## 2023-11-06 MED ORDER — DIAZEPAM 5 MG PO TABS
5.0000 mg | ORAL_TABLET | Freq: Once | ORAL | Status: AC
  Administered 2023-11-06: 5 mg via ORAL

## 2023-11-06 MED ORDER — CIPROFLOXACIN HCL 500 MG PO TABS
500.0000 mg | ORAL_TABLET | ORAL | Status: AC
  Administered 2023-11-06: 500 mg via ORAL
  Filled 2023-11-06: qty 1

## 2023-11-06 MED ORDER — DIPHENHYDRAMINE HCL 25 MG PO CAPS
25.0000 mg | ORAL_CAPSULE | ORAL | Status: AC
  Administered 2023-11-06: 25 mg via ORAL
  Filled 2023-11-06: qty 1

## 2023-11-06 NOTE — Discharge Instructions (Addendum)
1 - You may have urinary urgency (bladder spasms), pass small stone fragments, and bloody urine on / off for up to 2 weeks. This is normal. ° °2 - Call MD or go to ER for fever >102, severe pain / nausea / vomiting not relieved by medications, or acute change in medical status ° °

## 2023-11-06 NOTE — H&P (Signed)
 [name] is an 63 y.o. female.    Chief Complaint: Pre-OP LEFT Shockwave Lithotripsy  HPI:   1 - Recurrent Urolithiasis -  Pre 2025 - SWL x 1 10/2023 - 6mm left mid ureteral stone near L4 TP. UA without infectious parameters, Additional lower pole non-obstructing.  Today [name] is seen to proceed with LEFT shockwave lithotripsy for mid ureteral stone. No interval fevers. She has significant obeisty which may make targeting difficult.   Past Medical History:  Diagnosis Date   Arthritis 03/05/2010   Complication of anesthesia    Diabetes mellitus 04/17/2008   History of kidney stones    Numbness in both hands parts of both hands   all the time since cervical fusion   Numbness in feet    numbness parts of both feet since cervical fusion   Obesity    PONV (postoperative nausea and vomiting)     Past Surgical History:  Procedure Laterality Date    endometria lablation  02/14/2005   ANTERIOR CERVICAL DECOMP/DISCECTOMY FUSION N/A 07/08/2023   Procedure: CERVICAL FOUR-FIVE ANTERIOR CERVICAL DECOMPRESSION/DISCECTOMY FUSION WITH REMOVAL OF PREVIOUS ANTERIOR CERVICAL PLATE;  Surgeon: Mavis Purchase, MD;  Location: [facility] OR;  Service: Neurosurgery;  Laterality: N/A;  ACDF,IP,PLATE/SCREWS R54; POSS REM OLD PLATE 3C   CERVICAL FUSION  04/17/2008   CHOLECYSTECTOMY  07/01/2011   Procedure: LAPAROSCOPIC CHOLECYSTECTOMY WITH INTRAOPERATIVE CHOLANGIOGRAM;  Surgeon: Debby A. Cornett, MD;  Location: [facility];  Service: General;  Laterality: N/A;   COLONOSCOPY WITH PROPOFOL  N/A 02/25/2021   Procedure: COLONOSCOPY WITH PROPOFOL ;  Surgeon: Teressa Toribio SQUIBB, MD;  Location: [facility];  Service: Endoscopy;  Laterality: N/A;   EXTRACORPOREAL SHOCK WAVE LITHOTRIPSY Left 12/19/2022   Procedure: LEFT EXTRACORPOREAL SHOCK WAVE LITHOTRIPSY (ESWL);  Surgeon: Selma Donnice JONELLE, MD;  Location: [facility];  Service: Urology;  Laterality: Left;   HIATAL HERNIA REPAIR N/A 10/15/2020    Procedure: HERNIA REPAIR HIATAL;  Surgeon: Gladis Donnice, MD;  Location: [facility];  Service: General;  Laterality: N/A;   KNEE SURGERY Left 03/17/2012   Torn meniscus   POLYPECTOMY  02/25/2021   Procedure: POLYPECTOMY;  Surgeon: Teressa Toribio SQUIBB, MD;  Location: [facility];  Service: Endoscopy;;   TOTAL KNEE ARTHROPLASTY Left 12/2022   UPPER GI ENDOSCOPY N/A 10/15/2020   Procedure: UPPER GI ENDOSCOPY;  Surgeon: Gladis Donnice, MD;  Location: [facility];  Service: General;  Laterality: N/A;    Family History  Problem Relation Age of Onset   Stroke Father    Diabetes Father    Heart disease Father    Colon polyps Mother    Lung cancer Maternal Aunt    Diabetes Maternal Grandfather    Heart disease Maternal Grandfather    Liver cancer Maternal Aunt    Colon cancer Neg Hx    Social History:  reports that she has never smoked. She has never used smokeless tobacco. She reports that she does not drink alcohol and does not use drugs.  Allergies:  Allergies  Allergen Reactions   Jardiance [Empagliflozin] Other (See Comments)    Diabetic ketoacidosis    Ozempic (0.25 Or 0.5 Mg-Dose) [Semaglutide(0.25 Or 0.5mg -Dos)] Nausea And Vomiting   Penicillins Hives    No medications prior to admission.    No results found for this or any previous visit (from the past 48 hours). No results found.  Review of Systems  Genitourinary:  Positive for flank pain.  All other systems reviewed and are negative.   Height 5' 2 (1.575  m), weight 75.8 kg, last menstrual period 02/14/2005. Physical Exam Vitals reviewed.  HENT:     Head: Normocephalic.  Eyes:     Pupils: Pupils are equal, round, and reactive to light.  Cardiovascular:     Rate and Rhythm: Normal rate.  Pulmonary:     Effort: Pulmonary effort is normal.  Abdominal:     General: Abdomen is flat.  Genitourinary:    General: Normal vulva.     Comments: Mild Left CVAT  Musculoskeletal:     Cervical back: Normal range of motion.   Skin:    General: Skin is warm.  Neurological:     General: No focal deficit present.     Mental Status: She is alert.  Psychiatric:        Mood and Affect: Mood normal.      Assessment/Plan  Proceed as planned with LEFT shockwave lithotrispy. RIsks, benefits, alternatives, expected peri-treatment course discussed previously and reiterated today.   Ricardo KATHEE Alvaro Mickey., MD 11/06/2023, 7:58 AM

## 2023-11-06 NOTE — Brief Op Note (Signed)
 11/06/2023  12:10 PM  PATIENT:  [name]  63 y.o. female  PRE-OPERATIVE DIAGNOSIS:  LEFT URETERAL STONE  POST-OPERATIVE DIAGNOSIS:  * No post-op diagnosis entered *  PROCEDURE:  Procedure(s): LITHOTRIPSY, ESWL (Left)  SURGEON:  Surgeons and Role:    * Manny, Ricardo KATHEE Raddle., MD - Primary  PHYSICIAN ASSISTANT:   ASSISTANTS: none   ANESTHESIA:   MAC  EBL:  minimal   BLOOD ADMINISTERED:none  DRAINS: none   LOCAL MEDICATIONS USED:  NONE  SPECIMEN:  No Specimen  DISPOSITION OF SPECIMEN:  N/A  COUNTS:  YES  TOURNIQUET:  * No tourniquets in log *  DICTATION: .Note written in paper chart  PLAN OF CARE: Discharge to home after PACU  PATIENT DISPOSITION:  Short Stay   Delay start of Pharmacological VTE agent (>24hrs) due to surgical blood loss or risk of bleeding: not applicable

## 2023-11-09 ENCOUNTER — Encounter (HOSPITAL_COMMUNITY): Payer: Self-pay | Admitting: Urology

## 2023-11-18 NOTE — Brief Op Note (Signed)
 11/06/2023  5:20 PM  PATIENT:  [name]  63 y.o. female  PRE-OPERATIVE DIAGNOSIS:  LEFT URETERAL STONE  POST-OPERATIVE DIAGNOSIS:  * No post-op diagnosis entered *  PROCEDURE:  Procedure(s): LITHOTRIPSY, ESWL (Left)  SURGEON:  Surgeons and Role:    * Manny, Ricardo KATHEE Raddle., MD - Primary  PHYSICIAN ASSISTANT:   ASSISTANTS: none   ANESTHESIA:   MAC  EBL:  minimal   BLOOD ADMINISTERED:none  DRAINS: none   LOCAL MEDICATIONS USED:  NONE  SPECIMEN:  No Specimen  DISPOSITION OF SPECIMEN:  N/A  COUNTS:  YES  TOURNIQUET:  * No tourniquets in log *  DICTATION: .Note written in paper chart  PLAN OF CARE: Discharge to home after PACU  PATIENT DISPOSITION:  Short Stay   Delay start of Pharmacological VTE agent (>24hrs) due to surgical blood loss or risk of bleeding: not applicable

## 2024-01-20 ENCOUNTER — Other Ambulatory Visit: Payer: Self-pay | Admitting: Neurosurgery

## 2024-02-17 ENCOUNTER — Other Ambulatory Visit (HOSPITAL_COMMUNITY)

## 2024-02-17 ENCOUNTER — Encounter (HOSPITAL_COMMUNITY): Payer: Self-pay

## 2024-02-17 NOTE — Progress Notes (Signed)
 Surgical Instructions   Your procedure is scheduled on Monday, February 22, 2024. Report to [facility] Main Entrance A at 5:30 A.M., then check in with the Admitting office. Any questions or running late day of surgery: call [phone]  Questions prior to your surgery date: call [phone], Monday-Friday, 8am-4pm. If you experience any cold or flu symptoms such as cough, fever, chills, shortness of breath, etc. between now and your scheduled surgery, please notify us  at the above number.     Remember:  Do not eat or drink after midnight the night before your surgery-Sunday   Take these medicines the morning of surgery with A SIP OF WATER  cetirizine (ZYRTEC)  montelukast  (SINGULAIR )    May take these medicines IF NEEDED: acetaminophen  (TYLENOL )  HYDROcodone -acetaminophen  (NORCO/VICODIN)  promethazine  (PHENERGAN )  methocarbamol (ROBAXIN)  docusate sodium  (COLACE)   gabapentin  (NEURONTIN )  DULoxetine  (CYMBALTA )    One week prior to surgery, STOP taking any Aspirin (unless otherwise instructed by your surgeon) Aleve, Naproxen, Ibuprofen, Motrin, Advil, Goody's, BC's, all herbal medications, fish oil, and non-prescription vitamins. THIS INCLUDES YOUR diclofenac Sodium (VOLTAREN)  and celecoxib (CELEBREX).                celecoxib (CELEBREX)   LAST DOSE 02-16-24        WHAT DO I DO ABOUT MY DIABETES MEDICATION?   Do not take oral diabetes medicines (pills) the morning of surgery.  MOUNJARO  HOLD 7 DAYS PRIOR TO PROCEDURE.  Last dose on/before     The day of surgery, do not take other diabetes injectables, including Byetta (exenatide), Bydureon (exenatide ER), Victoza (liraglutide), or Trulicity (dulaglutide).  If your CBG is greater than 220 mg/dL, you may take  of your sliding scale (correction) dose of insulin .   HOW TO MANAGE YOUR DIABETES BEFORE AND AFTER SURGERY  Why is it important to control my blood sugar before and after surgery? Improving blood sugar  levels before and after surgery helps healing and can limit problems. A way of improving blood sugar control is eating a healthy diet by:  Eating less sugar and carbohydrates  Increasing activity/exercise  Talking with your doctor about reaching your blood sugar goals High blood sugars (greater than 180 mg/dL) can raise your risk of infections and slow your recovery, so you will need to focus on controlling your diabetes during the weeks before surgery. Make sure that the doctor who takes care of your diabetes knows about your planned surgery including the date and location.  How do I manage my blood sugar before surgery? Check your blood sugar at least 4 times a day, starting 2 days before surgery, to make sure that the level is not too high or low.  Check your blood sugar the morning of your surgery when you wake up and every 2 hours until you get to the Short Stay unit.  If your blood sugar is less than 70 mg/dL, you will need to treat for low blood sugar: Do not take insulin . Treat a low blood sugar (less than 70 mg/dL) with  cup of clear juice (cranberry or apple), 4 glucose tablets, OR glucose gel. Recheck blood sugar in 15 minutes after treatment (to make sure it is greater than 70 mg/dL). If your blood sugar is not greater than 70 mg/dL on recheck, call [phone] for further instructions. Report your blood sugar to the short stay nurse when you get to Short Stay.  If you are admitted to the hospital after surgery: Your blood sugar  will be checked by the staff and you will probably be given insulin  after surgery (instead of oral diabetes medicines) to make sure you have good blood sugar levels. The goal for blood sugar control after surgery is 80-180 mg/dL.          Do NOT Smoke (Tobacco/Vaping) for 24 hours prior to your procedure.  If you use a CPAP at night, you may bring your mask/headgear for your overnight stay.   You will be asked to remove any contacts, glasses,  piercing's, hearing aid's, dentures/partials prior to surgery. Please bring cases for these items if needed.    Patients discharged the day of surgery will not be allowed to drive home, and someone needs to stay with them for 24 hours.  SURGICAL WAITING ROOM VISITATION Patients may have no more than 2 support people in the waiting area - these visitors may rotate.   Pre-op nurse will coordinate an appropriate time for 1 ADULT support person, who may not rotate, to accompany patient in pre-op.  Children under the age of 76 must have an adult with them who is not the patient and must remain in the main waiting area with an adult.  If the patient needs to stay at the hospital during part of their recovery, the visitor guidelines for inpatient rooms apply.  Please refer to the [facility] website for the visitor guidelines for any additional information.   If you received a COVID test during your pre-op visit  it is requested that you wear a mask when out in public, stay away from anyone that may not be feeling well and notify your surgeon if you develop symptoms. If you have been in contact with anyone that has tested positive in the last 10 days please notify you surgeon.      Pre-operative 4 CHG Bathing Instructions   You can play a key role in reducing the risk of infection after surgery. Your skin needs to be as free of germs as possible. You can reduce the number of germs on your skin by washing with CHG (chlorhexidine  gluconate) soap before surgery. CHG is an antiseptic soap that kills germs and continues to kill germs even after washing.   DO NOT use if you have an allergy to chlorhexidine /CHG or antibacterial soaps. If your skin becomes reddened or irritated, stop using the CHG and notify one of our RNs at [phone].   Please shower with the CHG soap starting 4 days before surgery using the following schedule:     Please keep in mind the following:  DO NOT shave, including legs  and underarms, starting the day of your first shower.   You may shave your face at any point before/day of surgery.  Place clean sheets on your bed the day you start using CHG soap. Use a clean washcloth (not used since being washed) for each shower. DO NOT sleep with pets once you start using the CHG.   CHG Shower Instructions:  Wash your face and private area with normal soap. If you choose to wash your hair, wash first with your normal shampoo.  After you use shampoo/soap, rinse your hair and body thoroughly to remove shampoo/soap residue.  Turn the water OFF and apply  bottle of CHG soap to a CLEAN washcloth.  Apply CHG soap ONLY FROM YOUR NECK DOWN TO YOUR TOES (washing for 3-5 minutes)  DO NOT use CHG soap on face, private areas, open wounds, or sores.  Pay special attention to  the area where your surgery is being performed.  If you are having back surgery, having someone wash your back for you may be helpful. Wait 2 minutes after CHG soap is applied, then you may rinse off the CHG soap.  Pat dry with a clean towel  Put on clean clothes/pajamas   If you choose to wear lotion, please use ONLY the CHG-compatible lotions that are listed below.  Additional instructions for the day of surgery:  If you choose, you may shower the morning of surgery with an antibacterial soap.  DO NOT APPLY any lotions, deodorants, cologne, or perfumes.   Do not bring valuables to the hospital. [facility] is not responsible for any belongings/valuables. Do not wear nail polish, gel polish, artificial nails, or any other type of covering on natural nails (fingers and toes) Do not wear jewelry or makeup Put on clean/comfortable clothes.  Please brush your teeth.  Ask your nurse before applying any prescription medications to the skin.     CHG Compatible Lotions   Aveeno Moisturizing lotion  Cetaphil Moisturizing Cream  Cetaphil Moisturizing Lotion  Clairol Herbal Essence Moisturizing Lotion, Dry  Skin  Clairol Herbal Essence Moisturizing Lotion, Extra Dry Skin  Clairol Herbal Essence Moisturizing Lotion, Normal Skin  Curel Age Defying Therapeutic Moisturizing Lotion with Alpha Hydroxy  Curel Extreme Care Body Lotion  Curel Soothing Hands Moisturizing Hand Lotion  Curel Therapeutic Moisturizing Cream, Fragrance-Free  Curel Therapeutic Moisturizing Lotion, Fragrance-Free  Curel Therapeutic Moisturizing Lotion, Original Formula  Eucerin Daily Replenishing Lotion  Eucerin Dry Skin Therapy Plus Alpha Hydroxy Crme  Eucerin Dry Skin Therapy Plus Alpha Hydroxy Lotion  Eucerin Original Crme  Eucerin Original Lotion  Eucerin Plus Crme Eucerin Plus Lotion  Eucerin TriLipid Replenishing Lotion  Keri Anti-Bacterial Hand Lotion  Keri Deep Conditioning Original Lotion Dry Skin Formula Softly Scented  Keri Deep Conditioning Original Lotion, Fragrance Free Sensitive Skin Formula  Keri Lotion Fast Absorbing Fragrance Free Sensitive Skin Formula  Keri Lotion Fast Absorbing Softly Scented Dry Skin Formula  Keri Original Lotion  Keri Skin Renewal Lotion Keri Silky Smooth Lotion  Keri Silky Smooth Sensitive Skin Lotion  Nivea Body Creamy Conditioning Oil  Nivea Body Extra Enriched Lotion  Nivea Body Original Lotion  Nivea Body Sheer Moisturizing Lotion Nivea Crme  Nivea Skin Firming Lotion  NutraDerm 30 Skin Lotion  NutraDerm Skin Lotion  NutraDerm Therapeutic Skin Cream  NutraDerm Therapeutic Skin Lotion  ProShield Protective Hand Cream  Provon moisturizing lotion  Please read over the following fact sheets that you were given.

## 2024-02-17 NOTE — Progress Notes (Signed)
 PCP - Dr Garnette Ore Cardiologist - none  Chest x-ray - n/a EKG - 07/01/23 Stress Test - n/a ECHO - n/a Cardiac Cath - n/a  ICD Pacemaker/Loop - n/a  Sleep Study -  n/a  Diabetes Type 2 Fasting Blood Sugar - 100s-120s Checks Blood Sugar _____ times a day  Hold Mounjaro for 7 days prior to procedure.  Last dose was on     If your blood sugar is less than 70 mg/dL, you will need to treat for low blood sugar: Treat a low blood sugar (less than 70 mg/dL) with  cup of clear juice (cranberry or apple), 4 glucose tablets, OR glucose gel. Recheck blood sugar in 15 minutes after treatment (to make sure it is greater than 70 mg/dL). If your blood sugar is not greater than 70 mg/dL on recheck, call [phone] for further instructions.  Aspirin & Blood Thinner Instructions:  n/a  NPO  Anesthesia review: ***  STOP now taking any Aspirin (unless otherwise instructed by your surgeon), Aleve, Naproxen, Ibuprofen, Celebrex, Motrin, Advil, Goody's, BC's, all herbal medications, fish oil, and all vitamins.   Coronavirus Screening Do you have any of the following symptoms:  Cough yes/no: No Fever (>100.82F)  yes/no: No Runny nose yes/no: No Sore throat yes/no: No Difficulty breathing/shortness of breath  yes/no: No  Have you traveled in the last 14 days and where? yes/no: No  Patient verbalized understanding of instructions that were given to them at the PAT appointment. Patient was also instructed that they will need to review over the PAT instructions again at home before surgery.

## 2024-02-17 NOTE — Progress Notes (Addendum)
 Surgical Instructions   Your procedure is scheduled on February 22, 2024. Report to [facility] Main Entrance A at 5:30 A.M., then check in with the Admitting office. Any questions or running late day of surgery: call [phone]  Questions prior to your surgery date: call [phone], Monday-Friday, 8am-4pm. If you experience any cold or flu symptoms such as cough, fever, chills, shortness of breath, etc. between now and your scheduled surgery, please notify us  at the above number.     Remember:  Do not eat or drink after midnight the night before your surgery   Take these medicines the morning of surgery with A SIP OF WATER  cetirizine (ZYRTEC)  DULoxetine  (CYMBALTA )  gabapentin  (NEURONTIN )  montelukast  (SINGULAIR )    May take these medicines IF NEEDED: acetaminophen  (TYLENOL )  HYDROcodone -acetaminophen  (NORCO/VICODIN)  promethazine  (PHENERGAN )  methocarbamol (ROBAXIN)    One week prior to surgery, STOP taking any Aspirin (unless otherwise instructed by your surgeon) Aleve, Naproxen, Ibuprofen, Motrin, Advil, Goody's, BC's, all herbal medications, fish oil, and non-prescription vitamins. THIS INCLUDES YOUR diclofenac Sodium (VOLTAREN)  and celecoxib (CELEBREX).                celecoxib (CELEBREX)   LAST DOSE 02-16-24        WHAT DO I DO ABOUT MY DIABETES MEDICATION?   Do not take oral diabetes medicines (pills) the morning of surgery. MOUNJARO  HOLD 7 DAYS PRIOR TO PROCEDURE   The day of surgery, do not take other diabetes injectables, including Byetta (exenatide), Bydureon (exenatide ER), Victoza (liraglutide), or Trulicity (dulaglutide).  If your CBG is greater than 220 mg/dL, you may take  of your sliding scale (correction) dose of insulin .   HOW TO MANAGE YOUR DIABETES BEFORE AND AFTER SURGERY  Why is it important to control my blood sugar before and after surgery? Improving blood sugar levels before and after surgery helps healing and can limit problems. A  way of improving blood sugar control is eating a healthy diet by:  Eating less sugar and carbohydrates  Increasing activity/exercise  Talking with your doctor about reaching your blood sugar goals High blood sugars (greater than 180 mg/dL) can raise your risk of infections and slow your recovery, so you will need to focus on controlling your diabetes during the weeks before surgery. Make sure that the doctor who takes care of your diabetes knows about your planned surgery including the date and location.  How do I manage my blood sugar before surgery? Check your blood sugar at least 4 times a day, starting 2 days before surgery, to make sure that the level is not too high or low.  Check your blood sugar the morning of your surgery when you wake up and every 2 hours until you get to the Short Stay unit.  If your blood sugar is less than 70 mg/dL, you will need to treat for low blood sugar: Do not take insulin . Treat a low blood sugar (less than 70 mg/dL) with  cup of clear juice (cranberry or apple), 4 glucose tablets, OR glucose gel. Recheck blood sugar in 15 minutes after treatment (to make sure it is greater than 70 mg/dL). If your blood sugar is not greater than 70 mg/dL on recheck, call [phone] for further instructions. Report your blood sugar to the short stay nurse when you get to Short Stay.  If you are admitted to the hospital after surgery: Your blood sugar will be checked by the staff and you will probably be given insulin   after surgery (instead of oral diabetes medicines) to make sure you have good blood sugar levels. The goal for blood sugar control after surgery is 80-180 mg/dL.          Do NOT Smoke (Tobacco/Vaping) for 24 hours prior to your procedure.  If you use a CPAP at night, you may bring your mask/headgear for your overnight stay.   You will be asked to remove any contacts, glasses, piercing's, hearing aid's, dentures/partials prior to surgery. Please bring cases  for these items if needed.    Patients discharged the day of surgery will not be allowed to drive home, and someone needs to stay with them for 24 hours.  SURGICAL WAITING ROOM VISITATION Patients may have no more than 2 support people in the waiting area - these visitors may rotate.   Pre-op nurse will coordinate an appropriate time for 1 ADULT support person, who may not rotate, to accompany patient in pre-op.  Children under the age of 15 must have an adult with them who is not the patient and must remain in the main waiting area with an adult.  If the patient needs to stay at the hospital during part of their recovery, the visitor guidelines for inpatient rooms apply.  Please refer to the [facility] website for the visitor guidelines for any additional information.   If you received a COVID test during your pre-op visit  it is requested that you wear a mask when out in public, stay away from anyone that may not be feeling well and notify your surgeon if you develop symptoms. If you have been in contact with anyone that has tested positive in the last 10 days please notify you surgeon.      Pre-operative 4 CHG Bathing Instructions   You can play a key role in reducing the risk of infection after surgery. Your skin needs to be as free of germs as possible. You can reduce the number of germs on your skin by washing with CHG (chlorhexidine  gluconate) soap before surgery. CHG is an antiseptic soap that kills germs and continues to kill germs even after washing.   DO NOT use if you have an allergy to chlorhexidine /CHG or antibacterial soaps. If your skin becomes reddened or irritated, stop using the CHG and notify one of our RNs at [phone].   Please shower with the CHG soap starting 4 days before surgery using the following schedule:     Please keep in mind the following:  DO NOT shave, including legs and underarms, starting the day of your first shower.   You may shave your face  at any point before/day of surgery.  Place clean sheets on your bed the day you start using CHG soap. Use a clean washcloth (not used since being washed) for each shower. DO NOT sleep with pets once you start using the CHG.   CHG Shower Instructions:  Wash your face and private area with normal soap. If you choose to wash your hair, wash first with your normal shampoo.  After you use shampoo/soap, rinse your hair and body thoroughly to remove shampoo/soap residue.  Turn the water OFF and apply  bottle of CHG soap to a CLEAN washcloth.  Apply CHG soap ONLY FROM YOUR NECK DOWN TO YOUR TOES (washing for 3-5 minutes)  DO NOT use CHG soap on face, private areas, open wounds, or sores.  Pay special attention to the area where your surgery is being performed.  If you are having  back surgery, having someone wash your back for you may be helpful. Wait 2 minutes after CHG soap is applied, then you may rinse off the CHG soap.  Pat dry with a clean towel  Put on clean clothes/pajamas   If you choose to wear lotion, please use ONLY the CHG-compatible lotions that are listed below.  Additional instructions for the day of surgery:  If you choose, you may shower the morning of surgery with an antibacterial soap.  DO NOT APPLY any lotions, deodorants, cologne, or perfumes.   Do not bring valuables to the hospital. [facility] is not responsible for any belongings/valuables. Do not wear nail polish, gel polish, artificial nails, or any other type of covering on natural nails (fingers and toes) Do not wear jewelry or makeup Put on clean/comfortable clothes.  Please brush your teeth.  Ask your nurse before applying any prescription medications to the skin.     CHG Compatible Lotions   Aveeno Moisturizing lotion  Cetaphil Moisturizing Cream  Cetaphil Moisturizing Lotion  Clairol Herbal Essence Moisturizing Lotion, Dry Skin  Clairol Herbal Essence Moisturizing Lotion, Extra Dry Skin  Clairol Herbal  Essence Moisturizing Lotion, Normal Skin  Curel Age Defying Therapeutic Moisturizing Lotion with Alpha Hydroxy  Curel Extreme Care Body Lotion  Curel Soothing Hands Moisturizing Hand Lotion  Curel Therapeutic Moisturizing Cream, Fragrance-Free  Curel Therapeutic Moisturizing Lotion, Fragrance-Free  Curel Therapeutic Moisturizing Lotion, Original Formula  Eucerin Daily Replenishing Lotion  Eucerin Dry Skin Therapy Plus Alpha Hydroxy Crme  Eucerin Dry Skin Therapy Plus Alpha Hydroxy Lotion  Eucerin Original Crme  Eucerin Original Lotion  Eucerin Plus Crme Eucerin Plus Lotion  Eucerin TriLipid Replenishing Lotion  Keri Anti-Bacterial Hand Lotion  Keri Deep Conditioning Original Lotion Dry Skin Formula Softly Scented  Keri Deep Conditioning Original Lotion, Fragrance Free Sensitive Skin Formula  Keri Lotion Fast Absorbing Fragrance Free Sensitive Skin Formula  Keri Lotion Fast Absorbing Softly Scented Dry Skin Formula  Keri Original Lotion  Keri Skin Renewal Lotion Keri Silky Smooth Lotion  Keri Silky Smooth Sensitive Skin Lotion  Nivea Body Creamy Conditioning Oil  Nivea Body Extra Enriched Lotion  Nivea Body Original Lotion  Nivea Body Sheer Moisturizing Lotion Nivea Crme  Nivea Skin Firming Lotion  NutraDerm 30 Skin Lotion  NutraDerm Skin Lotion  NutraDerm Therapeutic Skin Cream  NutraDerm Therapeutic Skin Lotion  ProShield Protective Hand Cream  Provon moisturizing lotion  Please read over the following fact sheets that you were given.

## 2024-02-18 ENCOUNTER — Inpatient Hospital Stay (HOSPITAL_COMMUNITY): Admission: RE | Admit: 2024-02-18 | Discharge: 2024-02-18 | Attending: Neurosurgery

## 2024-02-18 ENCOUNTER — Other Ambulatory Visit: Payer: Self-pay

## 2024-02-18 ENCOUNTER — Encounter (HOSPITAL_COMMUNITY): Payer: Self-pay

## 2024-02-18 VITALS — BP 140/62 | HR 71 | Temp 97.8°F | Resp 18 | Ht 62.0 in | Wt 157.6 lb

## 2024-02-18 DIAGNOSIS — Z01818 Encounter for other preprocedural examination: Secondary | ICD-10-CM

## 2024-02-18 HISTORY — DX: Myoneural disorder, unspecified: G70.9

## 2024-02-18 HISTORY — DX: Dependence on other enabling machines and devices: Z99.89

## 2024-02-18 LAB — BASIC METABOLIC PANEL WITH GFR
Anion gap: 9 (ref 5–15)
BUN: 15 mg/dL (ref 8–23)
CO2: 31 mmol/L (ref 22–32)
Calcium: 9.4 mg/dL (ref 8.9–10.3)
Chloride: 100 mmol/L (ref 98–111)
Creatinine, Ser: 0.65 mg/dL (ref 0.44–1.00)
GFR, Estimated: >60 mL/min (ref >60)
Glucose, Bld: 102 mg/dL — ABNORMAL HIGH (ref 70–99)
Potassium: 4.2 mmol/L (ref 3.5–5.1)
Sodium: 140 mmol/L (ref 135–145)

## 2024-02-18 LAB — TYPE AND SCREEN
ABO/RH(D): O POS
Antibody Screen: NEGATIVE

## 2024-02-18 LAB — CBC
HCT: 46.9 % — ABNORMAL HIGH (ref 36.0–46.0)
Hemoglobin: 14.9 g/dL (ref 12.0–15.0)
MCH: 27.6 pg (ref 26.0–34.0)
MCHC: 31.8 g/dL (ref 30.0–36.0)
MCV: 87 fL (ref 80.0–100.0)
Platelets: 322 K/uL (ref 150–400)
RBC: 5.39 MIL/uL — ABNORMAL HIGH (ref 3.87–5.11)
RDW: 12.9 % (ref 11.5–15.5)
WBC: 8.6 K/uL (ref 4.0–10.5)
nRBC: 0 % (ref 0.0–0.2)

## 2024-02-18 LAB — HEMOGLOBIN A1C
Hgb A1c MFr Bld: 5.4 % (ref 4.8–5.6)
Mean Plasma Glucose: 108 mg/dL

## 2024-02-18 LAB — SURGICAL PCR SCREEN
MRSA, PCR: NEGATIVE
Staphylococcus aureus: POSITIVE — AB

## 2024-02-18 LAB — GLUCOSE, CAPILLARY: Glucose-Capillary: 103 mg/dL — ABNORMAL HIGH (ref 70–99)

## 2024-02-19 NOTE — Anesthesia Preprocedure Evaluation (Addendum)
 Anesthesia Evaluation  Patient identified by MRN, date of birth, ID band Patient awake    Reviewed: Allergy & Precautions, NPO status , Patient's Chart, lab work & pertinent test results  History of Anesthesia Complications Negative for: history of anesthetic complications  Airway Mallampati: II  TM Distance: >3 FB Neck ROM: Full    Dental  (+) Teeth Intact, Dental Advisory Given   Pulmonary neg shortness of breath, neg sleep apnea, neg COPD, neg recent URI   breath sounds clear to auscultation       Cardiovascular negative cardio ROS  Rhythm:Regular     Neuro/Psych neg Seizures  Neuromuscular disease    GI/Hepatic Neg liver ROS,GERD  Controlled,,  Endo/Other  diabetes    Renal/GU negative Renal ROS     Musculoskeletal  (+) Arthritis ,    Abdominal   Peds  Hematology negative hematology ROS (+) Lab Results      Component                Value               Date                      WBC                      8.6                 02/18/2024                HGB                      14.9                02/18/2024                HCT                      46.9 (H)            02/18/2024                MCV                      87.0                02/18/2024                PLT                      322                 02/18/2024              Anesthesia Other Findings   Reproductive/Obstetrics                              Anesthesia Physical Anesthesia Plan  ASA: 2  Anesthesia Plan: General   Post-op Pain Management: Ofirmev  IV (intra-op)* and Toradol  IV (intra-op)*   Induction: Intravenous  PONV Risk Score and Plan: 4 or greater and Ondansetron , Dexamethasone  and Midazolam   Airway Management Planned: Oral ETT  Additional Equipment: None  Intra-op Plan:   Post-operative Plan: Extubation in OR  Informed Consent: I have reviewed the patients History and Physical, chart, labs and discussed  the procedure including the risks, benefits and alternatives for  the proposed anesthesia with the patient or authorized representative who has indicated his/her understanding and acceptance.     Dental advisory given  Plan Discussed with: CRNA  Anesthesia Plan Comments: (PAT note written 02/19/2024 by Allison Zelenak, PA-C.  )         Anesthesia Quick Evaluation

## 2024-02-19 NOTE — Progress Notes (Signed)
 Anesthesia Chart Review:  Case: [id number] Date/Time: 02/22/24 0715   Procedure: POSTERIOR LUMBAR FUSION 2 LEVEL - PLIF,IP,POSTERIOR INSTRUMENTATION, L34, L45   Anesthesia type: General   Diagnosis: Spondylolisthesis, lumbar region [M43.16]   Pre-op diagnosis: SPONDYLOLISTHESIS, LUMBAR REGION   [facility]: [facility]OM [facility] / [facility]   Surgeons: Mavis Purchase, MD       DISCUSSION: Patient is a 63 year old female scheduled for the above procedure.   History includes never smoker, post-operative N/V, DM2, spinal surgery (C4-5 ACDF 07/08/2023), hiatal hernia repair, osteoarthritis (left TKA 12/2022), gastric sleeve resection (10/15/2020), cholecystectomy (07/01/2011), nephrolithiasis (lithotripsy 12/19/2022; 11/06/2023).   Noted she was on doxycycline for localized toe infections, nontoxic, No signs or reports of systemic symptoms. WBC was normal. I notified Nikki at Dr. Mavis' office who plans to follow-up with patient and with Dr. Mavis. Patient will have been on antibiotics for 5 days. A1c 5.4%.   Last Mounjaro was 02/14/2024.   Anesthesia team to evaluate on the day of surgery.    VS: BP (!) 140/62   Pulse 71   Temp 36.6 C (Oral)   Resp 18   Ht 5' 2 (1.575 m)   Wt 71.5 kg   LMP 02/14/2005   SpO2 100%   BMI 28.83 kg/m   PROVIDERS: Nichole Senior, MD is PCP    LABS: Labs reviewed: Acceptable for surgery. (all labs ordered are listed, but only abnormal results are displayed)  Labs Reviewed  SURGICAL PCR SCREEN - Abnormal; Notable for the following components:      Result Value   Staphylococcus aureus POSITIVE (*)    All other components within normal limits  GLUCOSE, CAPILLARY - Abnormal; Notable for the following components:   Glucose-Capillary 103 (*)    All other components within normal limits  CBC - Abnormal; Notable for the following components:   RBC 5.39 (*)    HCT 46.9 (*)    All other components within normal limits  BASIC METABOLIC PANEL WITH GFR - Abnormal;  Notable for the following components:   Glucose, Bld 102 (*)    All other components within normal limits  HEMOGLOBIN A1C  TYPE AND SCREEN     IMAGES: MRI C-spine 09/16/2023: IMPRESSION: 1. Interval C4-5 ACDF without apparent complication. The spinal canal and foramina appear patent at this level. 2. Remote C5-6 ACDF with mild residual spinal stenosis and foraminal narrowing bilaterally, grossly unchanged from previous MRI. Chronic myelomalacia at this level. 3. Stable chronic spondylosis at C3-4 and C6-7 with mild spinal stenosis and mild foraminal narrowing bilaterally. 4. No acute findings identified.   MRI L-spine 05/27/2023 ([facility]): IMPRESSION:  1. Transitional lumbosacral anatomy, reported as above. Correlate radiographically before intervention is considered.  2. Multilevel degenerative changes, with multilevel canal and foraminal stenoses.  3. Severe spinal stenosis L3-4, bilateral subarticular recess narrowing and moderately severe right and mild left neural foraminal narrowing.  4. Moderately severe spinal stenosis L4-5 and mild bilateral subarticular recess narrowing. Severe bilateral neural foraminal stenoses.  5. Severe central spinal stenosis L2-3. Mild bilateral neural foraminal stenoses.    EKG: 07/01/2023: NSR   CV: N/A  Past Medical History:  Diagnosis Date   Ambulates with cane    straight   Arthritis 03/05/2010   Complication of anesthesia    PONV   Diabetes mellitus 04/17/2008   type 2   History of kidney stones    surgery to remove and passed some stones   Neuromuscular disorder (HCC)    numbness  in hands and feet   Numbness in both hands parts of both hands   all the time since cervical fusion   Numbness in feet    numbness parts of both feet since cervical fusion   Obesity    PONV (postoperative nausea and vomiting)    Walker as ambulation aid     Past Surgical History:  Procedure Laterality Date    endometria lablation  02/14/2005    ANTERIOR CERVICAL DECOMP/DISCECTOMY FUSION N/A 07/08/2023   Procedure: CERVICAL FOUR-FIVE ANTERIOR CERVICAL DECOMPRESSION/DISCECTOMY FUSION WITH REMOVAL OF PREVIOUS ANTERIOR CERVICAL PLATE;  Surgeon: Mavis Purchase, MD;  Location: [facility] OR;  Service: Neurosurgery;  Laterality: N/A;  ACDF,IP,PLATE/SCREWS R54; POSS REM OLD PLATE 3C   CERVICAL FUSION  04/17/2008   CHOLECYSTECTOMY  07/01/2011   Procedure: LAPAROSCOPIC CHOLECYSTECTOMY WITH INTRAOPERATIVE CHOLANGIOGRAM;  Surgeon: Debby A. Cornett, MD;  Location: [facility];  Service: General;  Laterality: N/A;   COLONOSCOPY  09/12/2011   COLONOSCOPY WITH PROPOFOL  N/A 02/25/2021   Procedure: COLONOSCOPY WITH PROPOFOL ;  Surgeon: Teressa Toribio SQUIBB, MD;  [facility];  Service: Endoscopy;  Laterality: N/A;   EXTRACORPOREAL SHOCK WAVE LITHOTRIPSY Left 12/19/2022   Procedure: LEFT EXTRACORPOREAL SHOCK WAVE LITHOTRIPSY (ESWL);  Surgeon: Selma Donnice SAUNDERS, MD;  Location: [facility];  Service: Urology;  Laterality: Left;   EXTRACORPOREAL SHOCK WAVE LITHOTRIPSY Left 11/06/2023   Procedure: LITHOTRIPSY, ESWL;  Surgeon: Alvaro Ricardo KATHEE Mickey., MD;  Location: [facility];  Service: Urology;  Laterality: Left;   HIATAL HERNIA REPAIR N/A 10/15/2020   Procedure: HERNIA REPAIR HIATAL;  Surgeon: Gladis Donnice, MD;  Location: [facility];  Service: General;  Laterality: N/A;   KNEE SURGERY Left 03/17/2012   Torn meniscus   POLYPECTOMY  02/25/2021   Procedure: POLYPECTOMY;  Surgeon: Teressa Toribio SQUIBB, MD;  Location: [facility];  Service: Endoscopy;;   TOTAL KNEE ARTHROPLASTY Left 12/2022   UPPER GI ENDOSCOPY N/A 10/15/2020   Procedure: UPPER GI ENDOSCOPY;  Surgeon: Gladis Donnice, MD;  Location: [facility];  Service: General;  Laterality: N/A;   UPPER GI ENDOSCOPY  09/12/2011    MEDICATIONS:  doxycycline (ADOXA) 100 MG tablet   acetaminophen  (TYLENOL ) 500 MG tablet   blood glucose meter kit and supplies   Calcium Carbonate-Vitamin D (CVS CALCIUM/VIT D SOFT  CHEWS PO)   celecoxib (CELEBREX) 200 MG capsule   cetirizine (ZYRTEC) 10 MG tablet   diclofenac Sodium (VOLTAREN) 1 % GEL   docusate sodium  (COLACE) 100 MG capsule   DULoxetine  (CYMBALTA ) 60 MG capsule   gabapentin  (NEURONTIN ) 100 MG capsule   HYDROcodone -acetaminophen  (NORCO/VICODIN) 5-325 MG tablet   Insulin  Pen Needle (PEN NEEDLES 3/16) 31G X 5 MM MISC   methocarbamol (ROBAXIN) 500 MG tablet   montelukast  (SINGULAIR ) 10 MG tablet   MOUNJARO 5 MG/0.5ML Pen   Multiple Vitamins-Minerals (BARIATRIC MULTIVITAMINS/IRON PO)   promethazine  (PHENERGAN ) 25 MG tablet   No current facility-administered medications for this encounter.     Isaiah Ruder, PA-C Surgical Short Stay/Anesthesiology [facility] Phone [phone] [facility] Phone [phone] 02/19/2024 10:52 AM

## 2024-02-22 ENCOUNTER — Ambulatory Visit (HOSPITAL_COMMUNITY)

## 2024-02-22 ENCOUNTER — Ambulatory Visit (HOSPITAL_COMMUNITY): Payer: Self-pay | Admitting: Vascular Surgery

## 2024-02-22 ENCOUNTER — Ambulatory Visit (HOSPITAL_COMMUNITY)
Admission: RE | Admit: 2024-02-22 | Discharge: 2024-02-24 | Disposition: A | Attending: Neurosurgery | Admitting: Neurosurgery

## 2024-02-22 ENCOUNTER — Ambulatory Visit (HOSPITAL_COMMUNITY): Admission: RE | Disposition: A | Payer: Self-pay | Source: Home / Self Care | Attending: Neurosurgery

## 2024-02-22 ENCOUNTER — Encounter (HOSPITAL_COMMUNITY): Payer: Self-pay | Admitting: Neurosurgery

## 2024-02-22 DIAGNOSIS — M4316 Spondylolisthesis, lumbar region: Secondary | ICD-10-CM | POA: Diagnosis present

## 2024-02-22 DIAGNOSIS — M48061 Spinal stenosis, lumbar region without neurogenic claudication: Principal | ICD-10-CM | POA: Diagnosis present

## 2024-02-22 LAB — GLUCOSE, CAPILLARY
Glucose-Capillary: 89 mg/dL (ref 70–99)
Glucose-Capillary: 185 mg/dL — ABNORMAL HIGH (ref 70–99)
Glucose-Capillary: 166 mg/dL — ABNORMAL HIGH (ref 70–99)
Glucose-Capillary: 139 mg/dL — ABNORMAL HIGH (ref 70–99)

## 2024-02-22 LAB — URINALYSIS, W/ REFLEX TO CULTURE (INFECTION SUSPECTED)
Bilirubin Urine: NEGATIVE
Glucose, UA: NEGATIVE mg/dL
Ketones, ur: 5 mg/dL — AB
Leukocytes,Ua: NEGATIVE
Nitrite: NEGATIVE
Protein, ur: 100 mg/dL — AB
RBC / HPF: >50 RBC/hpf (ref 0–5)
Specific Gravity, Urine: 1.024 (ref 1.005–1.030)
pH: 5 (ref 5.0–8.0)

## 2024-02-22 SURGERY — POSTERIOR LUMBAR FUSION 2 LEVEL
Anesthesia: General

## 2024-02-22 MED ORDER — MONTELUKAST SODIUM 10 MG PO TABS
10.0000 mg | ORAL_TABLET | Freq: Every day | ORAL | Status: DC
  Administered 2024-02-23 – 2024-02-24 (×2): 10 mg via ORAL
  Filled 2024-02-22 (×3): qty 1

## 2024-02-22 MED ORDER — BUPIVACAINE LIPOSOME 1.3 % IJ SUSP
INTRAMUSCULAR | Status: AC
  Filled 2024-02-22: qty 20

## 2024-02-22 MED ORDER — SODIUM CHLORIDE 0.9 % IV SOLN
250.0000 mL | INTRAVENOUS | Status: AC
  Administered 2024-02-22: 250 mL via INTRAVENOUS

## 2024-02-22 MED ORDER — GABAPENTIN 100 MG PO CAPS: 100.0000 mg | ORAL_CAPSULE | Freq: Two times a day (BID) | ORAL | Status: DC | PRN

## 2024-02-22 MED ORDER — CHLORHEXIDINE GLUCONATE CLOTH 2 % EX PADS: 6.0000 | MEDICATED_PAD | Freq: Once | CUTANEOUS | Status: DC

## 2024-02-22 MED ORDER — ONDANSETRON HCL 4 MG/2ML IJ SOLN
INTRAMUSCULAR | Status: AC
  Filled 2024-02-22: qty 2

## 2024-02-22 MED ORDER — CIPROFLOXACIN 500 MG/5ML (10%) PO SUSR
500.0000 mg | Freq: Two times a day (BID) | ORAL | Status: AC
  Administered 2024-02-22 – 2024-02-24 (×4): 500 mg via ORAL
  Filled 2024-02-22 (×4): qty 5

## 2024-02-22 MED ORDER — DULOXETINE HCL 60 MG PO CPEP: 60.0000 mg | ORAL_CAPSULE | Freq: Every day | ORAL | Status: DC | PRN

## 2024-02-22 MED ORDER — PHENYLEPHRINE 80 MCG/ML (10ML) SYRINGE FOR IV PUSH (FOR BLOOD PRESSURE SUPPORT)
PREFILLED_SYRINGE | INTRAVENOUS | Status: DC | PRN
  Administered 2024-02-22 (×2): 160 ug via INTRAVENOUS

## 2024-02-22 MED ORDER — ALBUMIN HUMAN 5 % IV SOLN: INTRAVENOUS | Status: DC | PRN

## 2024-02-22 MED ORDER — ACETAMINOPHEN 650 MG RE SUPP: 650.0000 mg | RECTAL | Status: DC | PRN

## 2024-02-22 MED ORDER — PHENYLEPHRINE HCL-NACL 20-0.9 MG/250ML-% IV SOLN
INTRAVENOUS | Status: DC | PRN
  Administered 2024-02-22: 20 ug/min via INTRAVENOUS

## 2024-02-22 MED ORDER — ACETAMINOPHEN 500 MG PO TABS
1000.0000 mg | ORAL_TABLET | Freq: Four times a day (QID) | ORAL | Status: AC
  Administered 2024-02-22 – 2024-02-23 (×4): 1000 mg via ORAL
  Filled 2024-02-22 (×4): qty 2

## 2024-02-22 MED ORDER — INSULIN ASPART 100 UNIT/ML IJ SOLN: 0.0000 [IU] | INTRAMUSCULAR | Status: DC | PRN

## 2024-02-22 MED ORDER — ACETAMINOPHEN 325 MG PO TABS: 650.0000 mg | ORAL_TABLET | ORAL | Status: DC | PRN

## 2024-02-22 MED ORDER — CHLORHEXIDINE GLUCONATE 0.12 % MT SOLN
15.0000 mL | Freq: Once | OROMUCOSAL | Status: AC
  Administered 2024-02-22: 15 mL via OROMUCOSAL
  Filled 2024-02-22: qty 15

## 2024-02-22 MED ORDER — VANCOMYCIN HCL 750 MG/150ML IV SOLN
750.0000 mg | Freq: Two times a day (BID) | INTRAVENOUS | Status: DC
  Administered 2024-02-22: 750 mg via INTRAVENOUS
  Filled 2024-02-22 (×4): qty 150

## 2024-02-22 MED ORDER — ROCURONIUM BROMIDE 10 MG/ML (PF) SYRINGE
PREFILLED_SYRINGE | INTRAVENOUS | Status: AC
  Filled 2024-02-22: qty 10

## 2024-02-22 MED ORDER — BACITRACIN ZINC 500 UNIT/GM EX OINT
TOPICAL_OINTMENT | CUTANEOUS | Status: AC
  Filled 2024-02-22: qty 28.35

## 2024-02-22 MED ORDER — BUPIVACAINE LIPOSOME 1.3 % IJ SUSP
INTRAMUSCULAR | Status: DC | PRN
  Administered 2024-02-22: 20 mL

## 2024-02-22 MED ORDER — VANCOMYCIN HCL IN DEXTROSE 1-5 GM/200ML-% IV SOLN
1000.0000 mg | INTRAVENOUS | Status: AC
  Administered 2024-02-22: 1000 mg via INTRAVENOUS
  Filled 2024-02-22: qty 200

## 2024-02-22 MED ORDER — OXYCODONE HCL 5 MG/5ML PO SOLN: 5.0000 mg | Freq: Once | ORAL | Status: DC | PRN

## 2024-02-22 MED ORDER — THROMBIN 5000 UNITS EX KIT
PACK | CUTANEOUS | Status: AC
  Filled 2024-02-22: qty 1

## 2024-02-22 MED ORDER — FENTANYL CITRATE (PF) 100 MCG/2ML IJ SOLN
INTRAMUSCULAR | Status: AC
  Filled 2024-02-22: qty 2

## 2024-02-22 MED ORDER — SUGAMMADEX SODIUM 200 MG/2ML IV SOLN
INTRAVENOUS | Status: DC | PRN
  Administered 2024-02-22: 40 mg via INTRAVENOUS

## 2024-02-22 MED ORDER — BISACODYL 10 MG RE SUPP: 10.0000 mg | Freq: Every day | RECTAL | Status: DC | PRN

## 2024-02-22 MED ORDER — LACTATED RINGERS IV SOLN: INTRAVENOUS | Status: DC

## 2024-02-22 MED ORDER — LIDOCAINE 2% (20 MG/ML) 5 ML SYRINGE
INTRAMUSCULAR | Status: AC
  Filled 2024-02-22: qty 5

## 2024-02-22 MED ORDER — ONDANSETRON HCL 4 MG/2ML IJ SOLN
INTRAMUSCULAR | Status: DC | PRN
  Administered 2024-02-22: 4 mg via INTRAVENOUS

## 2024-02-22 MED ORDER — PROPOFOL 10 MG/ML IV BOLUS
INTRAVENOUS | Status: DC | PRN
  Administered 2024-02-22: 125 ug/kg/min via INTRAVENOUS
  Administered 2024-02-22: 130 mg via INTRAVENOUS

## 2024-02-22 MED ORDER — MIDAZOLAM HCL (PF) 2 MG/2ML IJ SOLN
INTRAMUSCULAR | Status: DC | PRN
  Administered 2024-02-22: 2 mg via INTRAVENOUS

## 2024-02-22 MED ORDER — ONDANSETRON HCL 4 MG/2ML IJ SOLN: 4.0000 mg | Freq: Four times a day (QID) | INTRAMUSCULAR | Status: DC | PRN

## 2024-02-22 MED ORDER — 0.9 % SODIUM CHLORIDE (POUR BTL) OPTIME
TOPICAL | Status: DC | PRN
  Administered 2024-02-22: 1000 mL

## 2024-02-22 MED ORDER — OXYCODONE HCL 5 MG PO TABS
10.0000 mg | ORAL_TABLET | ORAL | Status: DC | PRN
  Administered 2024-02-22: 10 mg via ORAL
  Filled 2024-02-22: qty 2

## 2024-02-22 MED ORDER — BUPIVACAINE-EPINEPHRINE (PF) 0.5% -1:200000 IJ SOLN
INTRAMUSCULAR | Status: DC | PRN
  Administered 2024-02-22: 10 mL via PERINEURAL

## 2024-02-22 MED ORDER — ROCURONIUM BROMIDE 10 MG/ML (PF) SYRINGE
PREFILLED_SYRINGE | INTRAVENOUS | Status: DC | PRN
  Administered 2024-02-22: 10 mg via INTRAVENOUS
  Administered 2024-02-22: 20 mg via INTRAVENOUS
  Administered 2024-02-22: 70 mg via INTRAVENOUS
  Administered 2024-02-22: 30 mg via INTRAVENOUS
  Administered 2024-02-22: 20 mg via INTRAVENOUS
  Administered 2024-02-22: 30 mg via INTRAVENOUS

## 2024-02-22 MED ORDER — BUPIVACAINE-EPINEPHRINE (PF) 0.5% -1:200000 IJ SOLN
INTRAMUSCULAR | Status: AC
  Filled 2024-02-22: qty 30

## 2024-02-22 MED ORDER — LORATADINE 10 MG PO TABS
10.0000 mg | ORAL_TABLET | Freq: Every day | ORAL | Status: DC
  Administered 2024-02-23 – 2024-02-24 (×2): 10 mg via ORAL
  Filled 2024-02-22 (×3): qty 1

## 2024-02-22 MED ORDER — LIDOCAINE 2% (20 MG/ML) 5 ML SYRINGE
INTRAMUSCULAR | Status: DC | PRN
  Administered 2024-02-22: 60 mg via INTRAVENOUS

## 2024-02-22 MED ORDER — FENTANYL CITRATE (PF) 250 MCG/5ML IJ SOLN
INTRAMUSCULAR | Status: DC | PRN
  Administered 2024-02-22 (×3): 50 ug via INTRAVENOUS
  Administered 2024-02-22: 100 ug via INTRAVENOUS

## 2024-02-22 MED ORDER — SODIUM CHLORIDE 0.9% FLUSH: 3.0000 mL | INTRAVENOUS | Status: DC | PRN

## 2024-02-22 MED ORDER — ACETAMINOPHEN 10 MG/ML IV SOLN
INTRAVENOUS | Status: AC
  Filled 2024-02-22: qty 100

## 2024-02-22 MED ORDER — PHENYLEPHRINE 80 MCG/ML (10ML) SYRINGE FOR IV PUSH (FOR BLOOD PRESSURE SUPPORT)
PREFILLED_SYRINGE | INTRAVENOUS | Status: AC
  Filled 2024-02-22: qty 10

## 2024-02-22 MED ORDER — SODIUM CHLORIDE 0.9% FLUSH
3.0000 mL | Freq: Two times a day (BID) | INTRAVENOUS | Status: DC
  Administered 2024-02-22 – 2024-02-23 (×3): 3 mL via INTRAVENOUS

## 2024-02-22 MED ORDER — FENTANYL CITRATE (PF) 250 MCG/5ML IJ SOLN
INTRAMUSCULAR | Status: AC
  Filled 2024-02-22: qty 5

## 2024-02-22 MED ORDER — OXYCODONE HCL 5 MG PO TABS: 5.0000 mg | ORAL_TABLET | Freq: Once | ORAL | Status: DC | PRN

## 2024-02-22 MED ORDER — OXYCODONE HCL 5 MG PO TABS
5.0000 mg | ORAL_TABLET | ORAL | Status: DC | PRN
  Administered 2024-02-22 – 2024-02-24 (×4): 5 mg via ORAL
  Filled 2024-02-22 (×4): qty 1

## 2024-02-22 MED ORDER — MIDAZOLAM HCL 2 MG/2ML IJ SOLN
INTRAMUSCULAR | Status: AC
  Filled 2024-02-22: qty 2

## 2024-02-22 MED ORDER — ONDANSETRON HCL 4 MG PO TABS: 4.0000 mg | ORAL_TABLET | Freq: Four times a day (QID) | ORAL | Status: DC | PRN

## 2024-02-22 MED ORDER — ZOLPIDEM TARTRATE 5 MG PO TABS: 5.0000 mg | ORAL_TABLET | Freq: Every evening | ORAL | Status: DC | PRN

## 2024-02-22 MED ORDER — METHOCARBAMOL 500 MG PO TABS
500.0000 mg | ORAL_TABLET | Freq: Four times a day (QID) | ORAL | Status: DC | PRN
  Filled 2024-02-22: qty 1

## 2024-02-22 MED ORDER — VASHE WOUND IRRIGATION OPTIME
TOPICAL | Status: DC | PRN
  Administered 2024-02-22: 34 [oz_av]

## 2024-02-22 MED ORDER — MENTHOL 3 MG MT LOZG: 1.0000 | LOZENGE | OROMUCOSAL | Status: DC | PRN

## 2024-02-22 MED ORDER — MORPHINE SULFATE (PF) 2 MG/ML IV SOLN: 2.0000 mg | INTRAVENOUS | Status: DC | PRN

## 2024-02-22 MED ORDER — ACETAMINOPHEN 10 MG/ML IV SOLN
INTRAVENOUS | Status: DC | PRN
  Administered 2024-02-22: 1000 mg via INTRAVENOUS

## 2024-02-22 MED ORDER — CYCLOBENZAPRINE HCL 10 MG PO TABS
10.0000 mg | ORAL_TABLET | Freq: Three times a day (TID) | ORAL | Status: DC | PRN
  Administered 2024-02-22 – 2024-02-24 (×3): 10 mg via ORAL
  Filled 2024-02-22 (×3): qty 1

## 2024-02-22 MED ORDER — DEXAMETHASONE SOD PHOSPHATE PF 10 MG/ML IJ SOLN
INTRAMUSCULAR | Status: DC | PRN
  Administered 2024-02-22: 10 mg via INTRAVENOUS

## 2024-02-22 MED ORDER — DOCUSATE SODIUM 100 MG PO CAPS
100.0000 mg | ORAL_CAPSULE | Freq: Two times a day (BID) | ORAL | Status: DC
  Administered 2024-02-22 – 2024-02-24 (×5): 100 mg via ORAL
  Filled 2024-02-22 (×5): qty 1

## 2024-02-22 MED ORDER — DEXMEDETOMIDINE HCL IN NACL 80 MCG/20ML IV SOLN
INTRAVENOUS | Status: DC | PRN
  Administered 2024-02-22: 4 ug via INTRAVENOUS
  Administered 2024-02-22: 8 ug via INTRAVENOUS

## 2024-02-22 MED ORDER — CIPROFLOXACIN IN D5W 400 MG/200ML IV SOLN
400.0000 mg | Freq: Once | INTRAVENOUS | Status: AC
  Administered 2024-02-22: 400 mg via INTRAVENOUS
  Filled 2024-02-22: qty 200

## 2024-02-22 MED ORDER — THROMBIN 5000 UNITS EX SOLR
OROMUCOSAL | Status: DC | PRN
  Administered 2024-02-22 (×2): 5 mL via TOPICAL

## 2024-02-22 MED ORDER — FENTANYL CITRATE (PF) 100 MCG/2ML IJ SOLN
25.0000 ug | INTRAMUSCULAR | Status: DC | PRN
  Administered 2024-02-22 (×2): 25 ug via INTRAVENOUS

## 2024-02-22 MED ORDER — ORAL CARE MOUTH RINSE: 15.0000 mL | Freq: Once | OROMUCOSAL | Status: AC

## 2024-02-22 MED ORDER — PHENOL 1.4 % MT LIQD: 1.0000 | OROMUCOSAL | Status: DC | PRN

## 2024-02-22 MED ORDER — PROPOFOL 10 MG/ML IV BOLUS
INTRAVENOUS | Status: AC
  Filled 2024-02-22: qty 20

## 2024-02-22 MED ORDER — ACETAMINOPHEN 10 MG/ML IV SOLN: 1000.0000 mg | Freq: Once | INTRAVENOUS | Status: DC | PRN

## 2024-02-22 SURGICAL SUPPLY — 60 items
BAG COUNTER SPONGE SURGICOUNT (BAG) ×1 IMPLANT
BASKET BONE COLLECTION (BASKET) ×1 IMPLANT
BENZOIN TINCTURE PRP APPL 2/3 (GAUZE/BANDAGES/DRESSINGS) ×1 IMPLANT
BLADE CLIPPER SURG (BLADE) IMPLANT
BUR MATCHSTICK NEURO 3.0 LAGG (BURR) ×1 IMPLANT
BUR PRECISION FLUTE 6.0 (BURR) ×1 IMPLANT
CAGE ALTERA 10X31X9-13 15D (Cage) IMPLANT
CANISTER SUCTION 3000ML PPV (SUCTIONS) ×1 IMPLANT
CAP LOCK DLX THRD (Cap) IMPLANT
CLEANSER WND VASHE INSTL 34OZ (WOUND CARE) ×1 IMPLANT
CNTNR URN SCR LID CUP LEK RST (MISCELLANEOUS) ×1 IMPLANT
COVER BACK TABLE 60X90IN (DRAPES) ×1 IMPLANT
DRAPE C-ARM 42X72 X-RAY (DRAPES) ×2 IMPLANT
DRAPE HALF SHEET 40X57 (DRAPES) ×1 IMPLANT
DRAPE LAPAROTOMY 100X72X124 (DRAPES) ×1 IMPLANT
DRAPE SURG 17X23 STRL (DRAPES) ×1 IMPLANT
DRSG OPSITE POSTOP 4X10 (GAUZE/BANDAGES/DRESSINGS) IMPLANT
DRSG OPSITE POSTOP 4X6 (GAUZE/BANDAGES/DRESSINGS) ×1 IMPLANT
ELECTRODE BLDE 4.0 EZ CLN MEGD (MISCELLANEOUS) ×1 IMPLANT
ELECTRODE REM PT RTRN 9FT ADLT (ELECTROSURGICAL) ×1 IMPLANT
EVACUATOR 1/8 PVC DRAIN (DRAIN) ×1 IMPLANT
GAUZE 4X4 16PLY ~~LOC~~+RFID DBL (SPONGE) ×1 IMPLANT
GLOVE BIO SURGEON STRL SZ 6 (GLOVE) ×1 IMPLANT
GLOVE BIO SURGEON STRL SZ8 (GLOVE) ×2 IMPLANT
GLOVE BIO SURGEON STRL SZ8.5 (GLOVE) ×2 IMPLANT
GLOVE BIOGEL PI IND STRL 6.5 (GLOVE) ×1 IMPLANT
GOWN STRL REUS W/ TWL LRG LVL3 (GOWN DISPOSABLE) ×1 IMPLANT
GOWN STRL REUS W/ TWL XL LVL3 (GOWN DISPOSABLE) ×2 IMPLANT
GOWN STRL REUS W/TWL 2XL LVL3 (GOWN DISPOSABLE) IMPLANT
HEMOSTAT POWDER KIT SURGIFOAM (HEMOSTASIS) ×1 IMPLANT
KIT BASIN OR (CUSTOM PROCEDURE TRAY) ×1 IMPLANT
KIT GRAFTMAG DEL NEURO DISP (NEUROSURGERY SUPPLIES) IMPLANT
KIT POSITIONER JACKSON TABLE (MISCELLANEOUS) ×1 IMPLANT
KIT TURNOVER KIT B (KITS) ×1 IMPLANT
NDL HYPO 21X1.5 SAFETY (NEEDLE) ×1 IMPLANT
NDL HYPO 22X1.5 SAFETY MO (MISCELLANEOUS) ×1 IMPLANT
PACK LAMINECTOMY NEURO (CUSTOM PROCEDURE TRAY) ×1 IMPLANT
PAD ARMBOARD POSITIONER FOAM (MISCELLANEOUS) ×3 IMPLANT
PATTIES SURGICAL .5 X1 (DISPOSABLE) IMPLANT
PATTIES SURGICAL 1X1 (DISPOSABLE) IMPLANT
PUTTY DBM GRAFTON 5CC (Putty) IMPLANT
ROD CURVED TI 6.35X60 (Rod) IMPLANT
SCREW PA DLX CREO 7.5X45 (Screw) IMPLANT
SCREW PA DLX CREO 7.5X50 (Screw) IMPLANT
SCREW PA DLX CREO 7.5X55 (Screw) IMPLANT
SOLN 0.9% NACL POUR BTL 1000ML (IV SOLUTION) ×1 IMPLANT
SOLN STERILE WATER BTL 1000 ML (IV SOLUTION) ×1 IMPLANT
SPACER ALTERA 10X31 8-12MM-8 (Spacer) IMPLANT
SPIKE FLUID TRANSFER (MISCELLANEOUS) ×1 IMPLANT
SPONGE NEURO XRAY DETECT 1X3 (DISPOSABLE) IMPLANT
SPONGE SURGIFOAM ABS GEL 100 (HEMOSTASIS) IMPLANT
SPONGE T-LAP 4X18 ~~LOC~~+RFID (SPONGE) IMPLANT
STRIP CLOSURE SKIN 1/2X4 (GAUZE/BANDAGES/DRESSINGS) ×1 IMPLANT
SUT VIC AB 1 CT1 18XBRD ANBCTR (SUTURE) ×2 IMPLANT
SUT VIC AB 2-0 CP2 18 (SUTURE) ×2 IMPLANT
SYR 20ML LL LF (SYRINGE) IMPLANT
TOWEL GREEN STERILE (TOWEL DISPOSABLE) ×1 IMPLANT
TOWEL GREEN STERILE FF (TOWEL DISPOSABLE) ×1 IMPLANT
TRAY FOLEY MTR SLVR 14FR STAT (SET/KITS/TRAYS/PACK) IMPLANT
TRAY FOLEY MTR SLVR 16FR STAT (SET/KITS/TRAYS/PACK) ×1 IMPLANT

## 2024-02-22 NOTE — Op Note (Signed)
 Brief history: The patient is a 63 year old white female who has complained of back and right greater left leg pain consistent with neurogenic claudication/lumbar radiculopathy.  She has failed medical management and was worked up with a lumbar MRI and lumbar x-rays which demonstrated lumbar spondylolisthesis, spinal stenosis, excetra.  I discussed the various treatment options with her.  She has decided to proceed with surgery.  Preoperative diagnosis: Lumbar spondylolisthesis, lumbar facet neuropathy, lumbar degenerative disc disease, spinal stenosis compressing L3, L4 and L5 nerve roots; lumbago; lumbar radiculopathy; neurogenic claudication  Postoperative diagnosis: The same  Procedure: Bilateral L3-4 and L4-5 laminotomy/foraminotomies/medial facetectomy to decompress the bilateral L3, L4 and L5 nerve roots(the work required to do this was in addition to the work required to do the posterior lumbar interbody fusion because of the patient's spinal stenosis, facet arthropathy. Etc. requiring a wide decompression of the nerve roots.); right L3-4 and L4-5 transforaminal lumbar interbody fusion with local morselized autograft bone and Medtronic DBM; insertion of interbody prosthesis at L3-4 and L4-5 (globus peek expandable interbody prosthesis); posterior segmental instrumentation from L3 to L5 with globus titanium pedicle screws and rods; posterior lateral arthrodesis at L3-4 and L4-5 with local morselized autograft bone and Medtronic r DBM.  Surgeon: Dr. Chyrl Budge  Asst.: Duwaine Beck, NP  Anesthesia: Gen. endotracheal  Estimated blood loss: 300 cc  Drains: Medium Hemovac drain in the epidural space  Complications: None  Description of procedure: The patient was brought to the operating room by the anesthesia team. General endotracheal anesthesia was induced. The patient was turned to the prone position on the Wilson frame. The patient's lumbosacral region was then prepared with Betadine  scrub and Betadine solution. Sterile drapes were applied.  I then injected the area to be incised with Marcaine  with epinephrine  solution. I then used the scalpel to make a linear midline incision over the L3-4 and L4-5 interspace. I then used electrocautery to perform a bilateral subperiosteal dissection exposing the spinous process and lamina of L3-4 and L4-5. We then obtained intraoperative radiograph to confirm our location. We then inserted the Verstrac retractor to provide exposure.  I began the decompression by using the high speed drill to perform laminotomies at L3-4 and L4-5 bilaterally. We then used the Kerrison punches to widen the laminotomy and removed the ligamentum flavum at L3-4 and L4-5 bilaterally. We used the Kerrison punches to remove the medial facets at L3-4 and L4-5 bilaterally, we removed the right L3-4 and L4-5 facets. We performed wide foraminotomies about the bilateral L3, L4 and L5 nerve roots completing the decompression.  We now turned our attention to the posterior lumbar interbody fusion. I used a scalpel to incise the intervertebral disc at L3-4 and L4-5 bilaterally. I then performed a partial intervertebral discectomy at L3-4 and L4-5 bilaterally using the pituitary forceps. We prepared the vertebral endplates at L3-4 and L4-5 bilaterally for the fusion by removing the soft tissues with the curettes. We then used the trial spacers to pick the appropriate sized interbody prosthesis. We prefilled his prosthesis with a combination of local morselized autograft bone that we obtained during the decompression as well as Zimmer DBM. We inserted the prefilled prosthesis into the interspace at L3-4 and L4-5 from the right, we then turned and expanded the prosthesis. There was a good snug fit of the prosthesis in the interspace. We then filled and the remainder of the intervertebral disc space with local morselized autograft bone and Zimmer DBM. This completed the posterior lumbar  interbody arthrodesis.  During the decompression and insertion of the prosthesis the assistant protected the thecal sac and nerve roots with the D'Errico retractor.  We now turned attention to the instrumentation. Under fluoroscopic guidance we cannulated the bilateral L3, L4 and L5 pedicles with the bone probe. We then removed the bone probe. We then tapped the pedicle with a 6.5 millimeter tap. We then removed the tap. We probed inside the tapped pedicle with a ball probe to rule out cortical breaches. We then inserted a 7.5 x 45, 50 and 55 millimeter pedicle screw into the L3, L4 and L5 pedicles bilaterally under fluoroscopic guidance. We then palpated along the medial aspect of the pedicles to rule out cortical breaches. There were none. The nerve roots were not injured. We then connected the unilateral pedicle screws with a lordotic rod. We compressed the construct and secured the rod in place with the caps. We then tightened the caps appropriately. This completed the instrumentation from L3-L5 bilaterally.  We now turned our attention to the posterior lateral arthrodesis at L3-4 and L4-5. We used the high-speed drill to decorticate the remainder of the facets, pars, transverse process at L3-4 and L4-5. We then applied a combination of local morselized autograft bone and Zimmer DBM over these decorticated posterior lateral structures. This completed the posterior lateral arthrodesis.  We then obtained hemostasis using bipolar electrocautery. We irrigated the wound out with vashe solution. We inspected the thecal sac and nerve roots and noted they were well decompressed. We then removed the retractor.  We placed a medium Hemovac drain in the epidural space and tunneled out through a separate stab wound.  We injected Exparel  . We reapproximated patient's thoracolumbar fascia with interrupted #1 Vicryl suture. We reapproximated patient's subcutaneous tissue with interrupted 2-0 Vicryl suture. The  reapproximated patient's skin with Steri-Strips and benzoin. The wound was then coated with bacitracin  ointment. A sterile dressing was applied. The drapes were removed. The patient was subsequently returned to the supine position where they were extubated by the anesthesia team. He was then transported to the post anesthesia care unit in stable condition. All sponge instrument and needle counts were reportedly correct at the end of this case.

## 2024-02-22 NOTE — Progress Notes (Signed)
 Orthopedic Tech Progress Note Patient Details:  [name] 16-Aug-1960 [id number]  Ortho Devices Type of Ortho Device: Lumbar corsett Ortho Device/Splint Location: back Ortho Device/Splint Interventions: Ordered, Adjustment   Post Interventions Patient Tolerated: Other (comment) Instructions Provided: Other (comment) Adjusted back brace to patient's approximate waist size and delivered to bedside. [name] 02/22/2024, 3:51 PM

## 2024-02-22 NOTE — H&P (Signed)
 Subjective: The patient is a 63 year old white female who is complaining of back and right greater than left leg pain consistent with neurogenic claudication/lumbar radiculopathy.  She has failed medical management and was worked up with a lumbar MRI and lumbar x-rays which demonstrated lumbar spinal stenosis, spondylolisthesis, excetra.  I discussed the various treatment options with her.  She has decided proceed with surgery.  Past Medical History:  Diagnosis Date   Ambulates with cane    straight   Arthritis 03/05/2010   Complication of anesthesia    PONV   Diabetes mellitus 04/17/2008   type 2   History of kidney stones    surgery to remove and passed some stones   Neuromuscular disorder (HCC)    numbness in hands and feet   Numbness in both hands parts of both hands   all the time since cervical fusion   Numbness in feet    numbness parts of both feet since cervical fusion   Obesity    PONV (postoperative nausea and vomiting)    Walker as ambulation aid     Past Surgical History:  Procedure Laterality Date    endometria lablation  02/14/2005   ANTERIOR CERVICAL DECOMP/DISCECTOMY FUSION N/A 07/08/2023   Procedure: CERVICAL FOUR-FIVE ANTERIOR CERVICAL DECOMPRESSION/DISCECTOMY FUSION WITH REMOVAL OF PREVIOUS ANTERIOR CERVICAL PLATE;  Surgeon: Mavis Purchase, MD;  Location: [facility] OR;  Service: Neurosurgery;  Laterality: N/A;  ACDF,IP,PLATE/SCREWS R54; POSS REM OLD PLATE 3C   CERVICAL FUSION  04/17/2008   CHOLECYSTECTOMY  07/01/2011   Procedure: LAPAROSCOPIC CHOLECYSTECTOMY WITH INTRAOPERATIVE CHOLANGIOGRAM;  Surgeon: Debby A. Cornett, MD;  Location: [facility];  Service: General;  Laterality: N/A;   COLONOSCOPY  09/12/2011   COLONOSCOPY WITH PROPOFOL  N/A 02/25/2021   Procedure: COLONOSCOPY WITH PROPOFOL ;  Surgeon: Teressa Toribio SQUIBB, MD;  [facility];  Service: Endoscopy;  Laterality: N/A;   EXTRACORPOREAL SHOCK WAVE LITHOTRIPSY Left 12/19/2022   Procedure: LEFT  EXTRACORPOREAL SHOCK WAVE LITHOTRIPSY (ESWL);  Surgeon: Selma Donnice SAUNDERS, MD;  Location[facility];  Service: Urology;  Laterality: Left;   EXTRACORPOREAL SHOCK WAVE LITHOTRIPSY Left 11/06/2023   Procedure: LITHOTRIPSY, ESWL;  Surgeon: Alvaro Ricardo KATHEE Mickey., MD;  Location: [facility];  Service: Urology;  Laterality: Left;   HIATAL HERNIA REPAIR N/A 10/15/2020   Procedure: HERNIA REPAIR HIATAL;  Surgeon: Gladis Donnice, MD;  Location: [facility];  Service: General;  Laterality: N/A;   KNEE SURGERY Left 03/17/2012   Torn meniscus   POLYPECTOMY  02/25/2021   Procedure: POLYPECTOMY;  Surgeon: Teressa Toribio SQUIBB, MD;  [facility];  Service: Endoscopy;;   TOTAL KNEE ARTHROPLASTY Left 12/2022   UPPER GI ENDOSCOPY N/A 10/15/2020   Procedure: UPPER GI ENDOSCOPY;  Surgeon: Gladis Donnice, MD;  Location: [facility];  Service: General;  Laterality: N/A;   UPPER GI ENDOSCOPY  09/12/2011    Allergies  Allergen Reactions   Jardiance [Empagliflozin] Other (See Comments)    Diabetic ketoacidosis    Ozempic (0.25 Or 0.5 Mg-Dose) [Semaglutide(0.25 Or 0.5mg -Dos)] Nausea And Vomiting   Penicillins Hives    Social History   Tobacco Use   Smoking status: Never   Smokeless tobacco: Never  Substance Use Topics   Alcohol use: No    Family History  Problem Relation Age of Onset   Stroke Father    Diabetes Father    Heart disease Father    Colon polyps Mother    Lung cancer Maternal Aunt    Diabetes Maternal Grandfather    Heart  disease Maternal Grandfather    Liver cancer Maternal Aunt    Colon cancer Neg Hx    Prior to Admission medications   Medication Sig Start Date End Date Taking? Authorizing Provider  acetaminophen  (TYLENOL ) 500 MG tablet Take 1,000 mg by mouth every 6 (six) hours as needed for moderate pain (pain score 4-6).   Yes [provider]  Calcium Carbonate-Vitamin D (CVS CALCIUM/VIT D SOFT CHEWS PO) Take 2 each by mouth 3 (three) times a week.   Yes [provider]  celecoxib (CELEBREX) 200 MG capsule Take 200 mg by mouth daily. 12/10/20  Yes [provider]  cetirizine (ZYRTEC) 10 MG tablet Take 10 mg by mouth daily.   Yes [provider]  diclofenac Sodium (VOLTAREN) 1 % GEL Apply 2 g topically daily as needed (pain).   Yes [provider]  docusate sodium  (COLACE) 100 MG capsule Take 100 mg by mouth daily as needed for moderate constipation.   Yes [provider]  doxycycline (ADOXA) 100 MG tablet Take 100 mg by mouth 2 (two) times daily. for toe infection x 10 days 02/17/24  Yes [provider]  DULoxetine  (CYMBALTA ) 60 MG capsule Take 60 mg by mouth daily as needed (neuropathy).   Yes [provider]  gabapentin  (NEURONTIN ) 100 MG capsule Take 100 mg by mouth 2 (two) times daily as needed (pain). 12/24/20  Yes [provider]  HYDROcodone -acetaminophen  (NORCO/VICODIN) 5-325 MG tablet Take 1 tablet by mouth every 6 (six) hours as needed for moderate pain (pain score 4-6).   Yes [provider]  methocarbamol  (ROBAXIN ) 500 MG tablet Take 500-1,000 mg by mouth every 6 (six) hours as needed for muscle spasms. 02/02/24  Yes [provider]  montelukast  (SINGULAIR ) 10 MG tablet Take 10 mg by mouth daily.   Yes [provider]  MOUNJARO 5 MG/0.5ML Pen Inject 5 mg into the skin once a week. Takes on Sunday 05/29/23  Yes [provider]  Multiple Vitamins-Minerals (BARIATRIC MULTIVITAMINS/IRON PO) Take 1 tablet by mouth daily.   Yes [provider]  promethazine  (PHENERGAN ) 25 MG tablet Take 25 mg by mouth every 8 (eight) hours as needed for nausea or vomiting.   Yes [provider]  blood glucose meter kit and supplies Dispense based on patient and insurance preference. Use up to four times daily as directed. (FOR ICD-10 E10.9, E11.9). 11/22/20   Arlice Reichert, MD  Insulin  Pen Needle (PEN NEEDLES 3/16) 31G X 5 MM MISC To use with Lantus   pen. 11/22/20   Arlice Reichert, MD     Review of Systems  Positive ROS: As above  All other systems have been reviewed and were otherwise negative with the exception of those mentioned in the HPI and as above.  Objective: Vital signs in last 24 hours: Temp:  [98.2 F (36.8 C)] 98.2 F (36.8 C) (12/08 0612) Pulse Rate:  [74] 74 (12/08 0612) Resp:  [18] 18 (12/08 0612) BP: (153)/(75) 153/75 (12/08 0612) SpO2:  [100 %] 100 % (12/08 0612) Estimated body mass index is 28.83 kg/m as calculated from the following:   Height as of 02/18/24: 5' 2 (1.575 m).   Weight as of 02/18/24: 71.5 kg.   General Appearance: Alert Head: Normocephalic, without obvious abnormality, atraumatic Eyes: PERRL, conjunctiva/corneas clear, EOM's intact,    Ears: Normal  Throat: Normal  Neck: Supple, Back: unremarkable Lungs: Clear to auscultation bilaterally, respirations unlabored Heart: Regular rate and rhythm, no murmur, rub or gallop Abdomen:  Soft, non-tender Extremities: Extremities normal, atraumatic, no cyanosis or edema Skin: unremarkable  NEUROLOGIC:   Mental status: alert and oriented,Motor Exam - grossly normal Sensory Exam - grossly normal Reflexes:  Coordination - grossly normal Gait - grossly normal Balance - grossly normal Cranial Nerves: I: smell Not tested  II: visual acuity  OS: Normal  OD: Normal   II: visual fields Full to confrontation  II: pupils Equal, round, reactive to light  III,VII: ptosis None  III,IV,VI: extraocular muscles  Full ROM  V: mastication Normal  V: facial light touch sensation  Normal  V,VII: corneal reflex  Present  VII: facial muscle function - upper  Normal  VII: facial muscle function - lower Normal  VIII: hearing Not tested  IX: soft palate elevation  Normal  IX,X: gag reflex Present  XI: trapezius strength  5/5  XI: sternocleidomastoid strength 5/5  XI: neck flexion strength  5/5  XII: tongue strength  Normal    Data Review Lab Results   Component Value Date   WBC 8.6 02/18/2024   HGB 14.9 02/18/2024   HCT 46.9 (H) 02/18/2024   MCV 87.0 02/18/2024   PLT 322 02/18/2024   Lab Results  Component Value Date   NA 140 02/18/2024   K 4.2 02/18/2024   CL 100 02/18/2024   CO2 31 02/18/2024   BUN 15 02/18/2024   CREATININE 0.65 02/18/2024   GLUCOSE 102 (H) 02/18/2024   Lab Results  Component Value Date   INR 1.1 11/19/2020    Assessment/Plan: Lumbar spondylosis, lumbar spinal stenosis, lumbar spondylolisthesis, lumbar radiculopathy, neurogenic claudication, lumbago: I discussed the situation with the patient.  I have reviewed her imaging studies with her and pointed out the abnormalities.  We have discussed the various treatment options including surgery.  I have described the surgical treatment option of an L3-4 and L4-5 decompression, instrumentation and fusion.  I have shown her surgical models.  I have given her surgical pamphlet.  We have discussed the risk, benefits, alternatives, expected postop course, and likelihood of achieving her goals with surgery.  I have answered all her questions.  She has decided proceed with surgery.   [name] 02/22/2024 7:29 AM

## 2024-02-22 NOTE — Anesthesia Procedure Notes (Signed)
 Procedure Name: Intubation Date/Time: 02/22/2024 7:45 AM  Performed by: Hedy Jarred, CRNAPre-anesthesia Checklist: Patient identified, Emergency Drugs available, Suction available and Patient being monitored Patient Re-evaluated:Patient Re-evaluated prior to induction Oxygen Delivery Method: Circle System Utilized Preoxygenation: Pre-oxygenation with 100% oxygen Induction Type: IV induction Ventilation: Mask ventilation without difficulty Laryngoscope Size: Miller and 2 Grade View: Grade I Tube type: Oral Tube size: 7.0 mm Number of attempts: 1 Airway Equipment and Method: Stylet and Oral airway Placement Confirmation: ETT inserted through vocal cords under direct vision, positive ETCO2 and breath sounds checked- equal and bilateral Secured at: 21 cm Tube secured with: Tape Dental Injury: Teeth and Oropharynx as per pre-operative assessment

## 2024-02-22 NOTE — Anesthesia Postprocedure Evaluation (Signed)
 Anesthesia Post Note  Patient: THETA LEAF  Procedure(s) Performed: POSTERIOR LUMBAR INTERBODY FUSION INTERBODY PROTHESIS LUMBAR THREE-FOUR LUMBAR FOUR-FIVE     Patient location during evaluation: PACU Anesthesia Type: General Level of consciousness: awake and alert Pain management: pain level controlled Vital Signs Assessment: post-procedure vital signs reviewed and stable Respiratory status: spontaneous breathing, nonlabored ventilation, respiratory function stable and patient connected to nasal cannula oxygen Cardiovascular status: blood pressure returned to baseline and stable Postop Assessment: no apparent nausea or vomiting Anesthetic complications: no   No notable events documented.  Last Vitals:  Vitals:   02/22/24 1500 02/22/24 1515  BP: (!) 118/59 125/72  Pulse: 72 67  Resp: 12 17  Temp:  36.6 C  SpO2: 100% 98%    Last Pain:  Vitals:   02/22/24 1500  TempSrc:   PainSc: Asleep                 Garnette DELENA Gab

## 2024-02-22 NOTE — TOC CM/SW Note (Signed)
 TOC consult received for d/c planning needs, possible SNF vs. HH. Follow-up to be completed with patient as appropriate.   Merilee Batty, MSN, RN Case Management [phone]

## 2024-02-22 NOTE — Transfer of Care (Signed)
 Immediate Anesthesia Transfer of Care Note  Patient: [name]  Procedure(s) Performed: POSTERIOR LUMBAR INTERBODY FUSION INTERBODY PROTHESIS LUMBAR THREE-FOUR LUMBAR FOUR-FIVE  Patient Location: PACU  Anesthesia Type:General  Level of Consciousness: awake, alert , and oriented  Airway & Oxygen Therapy: Patient Spontanous Breathing  Post-op Assessment: Report given to RN and Post -op Vital signs reviewed and stable  Post vital signs: Reviewed and stable  Last Vitals:  Vitals Value Taken Time  BP 112/58 02/22/24 13:03  Temp 36.6 C 02/22/24 13:03  Pulse 67 02/22/24 13:12  Resp 14 02/22/24 13:12  SpO2 91 % 02/22/24 13:12  Vitals shown include unfiled device data.  Last Pain:  Vitals:   02/22/24 0612  TempSrc: Oral         Complications: No notable events documented.

## 2024-02-22 NOTE — Progress Notes (Signed)
 Pharmacy Antibiotic Note  [name] is a 63 y.o. female admitted on 02/22/2024 with surgical prophylaxis.  Pharmacy has been consulted for vanc dosing.  S/p laminotomy/foraminotomies/medial facetectomy. Vanc ordered for prophylaxis. Plan to remove drain in AM. Scr <1  Plan: Vanc 750mg  IV BID F/u dc vanc when drain is removed     Temp (24hrs), Avg:98 F (36.7 C), Min:97.9 F (36.6 C), Max:98.2 F (36.8 C)  Recent Labs  Lab 02/18/24 0852  WBC 8.6  CREATININE 0.65    Estimated Creatinine Clearance: 67.6 mL/min (by C-G formula based on SCr of 0.65 mg/dL).    Allergies  Allergen Reactions   Jardiance [Empagliflozin] Other (See Comments)    Diabetic ketoacidosis    Ozempic (0.25 Or 0.5 Mg-Dose) [Semaglutide(0.25 Or 0.5mg -Dos)] Nausea And Vomiting   Penicillins Hives    Antimicrobials this admission: 12/8 vanc>>  Dose adjustments this admission:   Microbiology results:  Sergio Batch, PharmD, BCIDP, AAHIVP, CPP Infectious Disease Pharmacist 02/22/2024 3:47 PM

## 2024-02-23 LAB — GLUCOSE, CAPILLARY
Glucose-Capillary: 86 mg/dL (ref 70–99)
Glucose-Capillary: 104 mg/dL — ABNORMAL HIGH (ref 70–99)
Glucose-Capillary: 132 mg/dL — ABNORMAL HIGH (ref 70–99)

## 2024-02-23 MED ORDER — ACETAMINOPHEN 500 MG PO TABS
1000.0000 mg | ORAL_TABLET | Freq: Four times a day (QID) | ORAL | Status: DC
  Administered 2024-02-23 – 2024-02-24 (×3): 1000 mg via ORAL
  Filled 2024-02-23 (×3): qty 2

## 2024-02-23 MED FILL — Thrombin For Soln 5000 Unit: CUTANEOUS | Qty: 5000 | Status: AC

## 2024-02-23 NOTE — Plan of Care (Signed)

## 2024-02-23 NOTE — Evaluation (Signed)
 Physical Therapy Evaluation  Patient Details Name: [name] MRN: [id number] DOB: 05-15-1960 Today's Date: 02/23/2024  History of Present Illness  Pt is a 63 y/o female who presents s/p L3-L5 PLIF on 02/22/2024. PMH significant for DM, L TKA, cholecystectomy, cervical fusion 2010 with numbness in hands/feet s/p surgery, C4-C5 ACDF 07/08/23.  Clinical Impression  Pt admitted with above diagnosis. At the time of PT eval, pt was able to demonstrate transfers and ambulation with gross CGA and RW for support. Pt was educated on precautions, brace application/wearing schedule, appropriate activity progression, and car transfer. Pt currently with functional limitations due to the deficits listed below (see PT Problem List). Pt will benefit from skilled PT to increase their independence and safety with mobility to allow discharge to the venue listed below.          If plan is discharge home, recommend the following: A little help with walking and/or transfers;A little help with bathing/dressing/bathroom;Assistance with cooking/housework;Help with stairs or ramp for entrance;Assist for transportation   Can travel by private vehicle        Equipment Recommendations BSC/3in1  Recommendations for Other Services   (Pt declines OT evaluation)    Functional Status Assessment Patient has had a recent decline in their functional status and demonstrates the ability to make significant improvements in function in a reasonable and predictable amount of time.     Precautions / Restrictions Precautions Precautions: Fall;Back Precaution Booklet Issued: Yes (comment) Recall of Precautions/Restrictions: Intact Precaution/Restrictions Comments: Reviewed handout and pt was cued for precautions during functional mobility. Required Braces or Orthoses: Spinal Brace Spinal Brace: Lumbar corset;Applied in sitting position Restrictions Weight Bearing Restrictions Per Provider Order: No      Mobility  Bed  Mobility Overal bed mobility: Needs Assistance Bed Mobility: Rolling, Sidelying to Sit Rolling: Contact guard assist Sidelying to sit: Contact guard assist       General bed mobility comments: Light hands on guarding to guide RUE to rail. Pt able to transition to EOB utilizing log roll without physical assistance.    Transfers Overall transfer level: Needs assistance Equipment used: Rolling walker (2 wheels) Transfers: Sit to/from Stand Sit to Stand: Contact guard assist           General transfer comment: VC's for hand placement on seated surface for safety.    Ambulation/Gait Ambulation/Gait assistance: Contact guard assist Gait Distance (Feet): 200 Feet Assistive device: Rolling walker (2 wheels) Gait Pattern/deviations: Step-through pattern, Decreased stride length, Trunk flexed Gait velocity: Decreased Gait velocity interpretation: 1.31 - 2.62 ft/sec, indicative of limited community ambulator   General Gait Details: VC's for improved posture, closer walker proximity and forward gaze. No gross unsteadiness or LOB noted however pt with very flexed trunk throughout OOB mobility. Able to make mild corrective changes.  Stairs            Wheelchair Mobility     Tilt Bed    Modified Rankin (Stroke Patients Only)       Balance Overall balance assessment: Needs assistance Sitting-balance support: Feet supported, No upper extremity supported Sitting balance-Leahy Scale: Fair     Standing balance support: Bilateral upper extremity supported, During functional activity, Reliant on assistive device for balance Standing balance-Leahy Scale: Poor                               Pertinent Vitals/Pain Pain Assessment Pain Assessment: 0-10 Pain Score: 5  Pain Location: back/incision Pain  Descriptors / Indicators: Operative site guarding, Sore Pain Intervention(s): Limited activity within patient's tolerance, Monitored during session, Repositioned     Home Living Family/patient expects to be discharged to:: Private residence Living Arrangements: Alone (Friend staying for a while) Available Help at Discharge: Friend(s);Available 24 hours/day Type of Home: House Home Access: Ramped entrance       Home Layout: One level Home Equipment: Shower seat;Shower seat - built [employer] (2 wheels);Hand held shower head      Prior Function Prior Level of Function : Independent/Modified Independent;History of Falls (last six months)             Mobility Comments: Pt states she has been using the [facility]. Reports ~5 falls in the last 6 months.       Extremity/Trunk Assessment   Upper Extremity Assessment Upper Extremity Assessment: RUE deficits/detail RUE Deficits / Details: Pt with B hand numbness s/p prior cervical fusion, however pt reports more strength deficits in the RUE compared to the L. She can get her hand up to the top of her head while sitting EOB however it is slow and effortful.    Lower Extremity Assessment Lower Extremity Assessment: Generalized weakness (Bilaterally pt with numbness of feet and decreased sensation up to lower 1/3 of lower leg.)    Cervical / Trunk Assessment Cervical / Trunk Assessment: Back Surgery  Communication   Communication Communication: No apparent difficulties    Cognition Arousal: Alert Behavior During Therapy: WFL for tasks assessed/performed   PT - Cognitive impairments: No apparent impairments                         Following commands: Intact       Cueing Cueing Techniques: Verbal cues, Gestural cues     General Comments      Exercises     Assessment/Plan    PT Assessment Patient needs continued PT services  PT Problem List Decreased strength;Decreased activity tolerance;Decreased balance;Decreased mobility;Decreased knowledge of use of DME;Decreased safety awareness;Decreased knowledge of precautions;Pain       PT Treatment Interventions DME  instruction;Gait training;Functional mobility training;Stair training;Therapeutic activities;Therapeutic exercise;Balance training;Patient/family education    PT Goals (Current goals can be found in the Care Plan section)  Acute Rehab PT Goals Patient Stated Goal: Be able to manage at home, however wants to be able to go to her niece's home at Christmas where she will have to negotiate stairs. PT Goal Formulation: With patient Time For Goal Achievement: 03/01/24 Potential to Achieve Goals: Good    Frequency Min 5X/week     Co-evaluation               AM-PAC PT 6 Clicks Mobility  Outcome Measure Help needed turning from your back to your side while in a flat bed without using bedrails?: A Little Help needed moving from lying on your back to sitting on the side of a flat bed without using bedrails?: A Little Help needed moving to and from a bed to a chair (including a wheelchair)?: A Little Help needed standing up from a chair using your arms (e.g., wheelchair or bedside chair)?: A Little Help needed to walk in hospital room?: A Little Help needed climbing 3-5 steps with a railing? : A Little 6 Click Score: 18    End of Session Equipment Utilized During Treatment: Gait belt;Back brace Activity Tolerance: Patient tolerated treatment well Patient left: in bed;with call bell/phone within reach;with family/visitor present Nurse Communication: Mobility status PT  Visit Diagnosis: Unsteadiness on feet (R26.81);Pain Pain - part of body:  (back)    Time: 8897-8871 PT Time Calculation (min) (ACUTE ONLY): 26 min   Charges:   PT Evaluation $PT Eval Low Complexity: 1 Low PT Treatments $Gait Training: 8-22 mins PT General Charges $$ ACUTE PT VISIT: 1 Visit         Leita Sable, PT, DPT Acute Rehabilitation Services Secure Chat Preferred Office: [phone]   Leita JONETTA Sable 02/23/2024, 11:48 AM

## 2024-02-23 NOTE — Progress Notes (Signed)
 Subjective: Patient is alert and pleasant.  She looks complaints.  She wants to go home tomorrow because she continues.  Objective: Vital signs in last 24 hours: Temp:  [97.8 F (36.6 C)-99.6 F (37.6 C)] 99.6 F (37.6 C) (12/09 0739) Pulse Rate:  [65-88] 86 (12/09 0739) Resp:  [12-19] 18 (12/09 0739) BP: (103-138)/(52-72) 112/54 (12/09 0739) SpO2:  [98 %-100 %] 98 % (12/09 0739) Estimated body mass index is 28.83 kg/m as calculated from the following:   Height as of 02/18/24: 5' 2 (1.575 m).   Weight as of 02/18/24: 71.5 kg.   Intake/Output from previous day: 12/08 0701 - 12/09 0700 In: 3010 [P.O.:960; I.V.:1500; IV Piggyback:550] Out: 680 [Urine:210; Drains:370; Blood:100] Intake/Output this shift: No intake/output data recorded.  Physical exam the patient is alert and oriented.  Her strength is normal.  Her dressing is clean dry.  Lab Results: No results for input(s): WBC, HGB, HCT, PLT in the last 72 hours. BMET No results for input(s): NA, K, CL, CO2, GLUCOSE, BUN, CREATININE, CALCIUM in the last 72 hours.  Studies/Results: DG Lumbar Spine 2-3 Views Result Date: 02/22/2024 CLINICAL DATA:  Elective surgery. EXAM: LUMBAR SPINE - 2-3 VIEW COMPARISON:  10/09/2023 FINDINGS: Two fluoroscopic spot views of the lumbar spine submitted from the operating room. Pedicle screws at L3, L4, and L5 with interbody spacers. Fluoroscopy time 25.2 seconds. Dose 35.65 mGy. IMPRESSION: Intraoperative fluoroscopy during lumbar fusion. Electronically Signed   By: Andrea Gasman M.D.   On: 02/22/2024 14:59   DG C-Arm 1-60 Min-No Report Result Date: 02/22/2024 Fluoroscopy was utilized by the requesting physician.  No radiographic interpretation.   DG C-Arm 1-60 Min-No Report Result Date: 02/22/2024 Fluoroscopy was utilized by the requesting physician.  No radiographic interpretation.   DG C-Arm 1-60 Min-No Report Result Date: 02/22/2024 Fluoroscopy was utilized by the  requesting physician.  No radiographic interpretation.   DG Lumbar Spine 1 View Result Date: 02/22/2024 CLINICAL DATA:  Elective surgery. EXAM: LUMBAR SPINE - 1 VIEW COMPARISON:  Radiograph 10/09/2023 FINDINGS: Suspected transitional lumbosacral anatomy with partially lumbarized S1. Assuming the Foley most formed disc space is L5-S1, surgical instruments localize posteriorly at the L4 level. IMPRESSION: Intraoperative localization. Electronically Signed   By: Andrea Gasman M.D.   On: 02/22/2024 12:05    Assessment/Plan: Postop day #1: The patient is doing well.  She will likely go home tomorrow.  I gave discharge instructions and answered all her questions.  LOS: 1 day     [name] 02/23/2024, 7:40 AM     Patient ID: [name], female   DOB: Nov 30, 1960, 63 y.o.   MRN: [id number]

## 2024-02-24 DIAGNOSIS — M48062 Spinal stenosis, lumbar region with neurogenic claudication: Secondary | ICD-10-CM | POA: Diagnosis not present

## 2024-02-24 LAB — GLUCOSE, CAPILLARY: Glucose-Capillary: 95 mg/dL (ref 70–99)

## 2024-02-24 MED ORDER — CHLORHEXIDINE GLUCONATE CLOTH 2 % EX PADS: 6.0000 | MEDICATED_PAD | Freq: Every day | CUTANEOUS | Status: DC

## 2024-02-24 MED ORDER — DOCUSATE SODIUM 100 MG PO CAPS: 100.0000 mg | ORAL_CAPSULE | Freq: Two times a day (BID) | ORAL | 0 refills | Status: AC

## 2024-02-24 MED ORDER — OXYCODONE-ACETAMINOPHEN 5-325 MG PO TABS
1.0000 | ORAL_TABLET | ORAL | Status: DC | PRN
  Administered 2024-02-24: 2 via ORAL
  Filled 2024-02-24: qty 2

## 2024-02-24 MED ORDER — OXYCODONE-ACETAMINOPHEN 5-325 MG PO TABS: 1.0000 | ORAL_TABLET | ORAL | 0 refills | Status: AC | PRN

## 2024-02-24 MED ORDER — CYCLOBENZAPRINE HCL 10 MG PO TABS: 10.0000 mg | ORAL_TABLET | Freq: Three times a day (TID) | ORAL | 0 refills | Status: AC | PRN

## 2024-02-24 MED ORDER — MUPIROCIN 2 % EX OINT: 1.0000 | TOPICAL_OINTMENT | Freq: Two times a day (BID) | CUTANEOUS | Status: DC

## 2024-02-24 NOTE — Plan of Care (Signed)
 Pt doing well. Pt given D/C instructions with verbal understanding. Rx's were sent to the pharmacy by MD. Pt's incision is clean and dry with no sign of infection. Pt's IV was removed prior to D/C. Pt D/C'd home via wheelchair per MD order. Pt is stable @ D/C and has no other needs at this time. Rema Fendt, RN

## 2024-02-24 NOTE — Discharge Summary (Signed)
 Physician Discharge Summary  Patient ID: [name] MRN: [id number] DOB/AGE: 62-Mar-1962 62 y.o.  Admit date: 02/22/2024 Discharge date: 02/24/2024  Admission Diagnoses: Lumbar spondylolisthesis, lumbar facet arthropathy, lumbar spinal stenosis, lumbar radiculopathy, neurogenic claudication, lumbago  Discharge Diagnoses: The same Principal Problem:   Lumbar spinal stenosis Active Problems:   Spondylolisthesis of lumbar region   Discharged Condition: good  Hospital Course: I performed an L3-4 and L4-5 decompression, instrumentation and fusion on the patient on 02/22/2024.  The surgery went well.  The patient's postoperative course was unremarkable.  On postoperative day #2 she requested discharge to home.  She was given verbal and written discharge instructions.  All her questions were answered.  Consults: PT, care management Significant Diagnostic Studies: None Treatments: L3-4 and L4-5 decompression, instrumentation and fusion. Discharge Exam: Blood pressure (!) 113/47, pulse 77, temperature 98.9 F (37.2 C), temperature source Oral, resp. rate 19, last menstrual period 02/14/2005, SpO2 96%. The patient is alert and pleasant.  She looks well.  Her strength is normal.  Her dressing is clean dry.  Disposition: Home  Discharge Instructions     Call MD for:  difficulty breathing, headache or visual disturbances   Complete by: As directed    Call MD for:  extreme fatigue   Complete by: As directed    Call MD for:  hives   Complete by: As directed    Call MD for:  persistant dizziness or light-headedness   Complete by: As directed    Call MD for:  persistant nausea and vomiting   Complete by: As directed    Call MD for:  redness, tenderness, or signs of infection (pain, swelling, redness, odor or green/yellow discharge around incision site)   Complete by: As directed    Call MD for:  severe uncontrolled pain   Complete by: As directed    Call MD for:  temperature >100.4    Complete by: As directed    Diet - low sodium heart healthy   Complete by: As directed    Discharge instructions   Complete by: As directed    Call [phone] for a followup appointment. Take a stool softener while you are using pain medications.   Driving Restrictions   Complete by: As directed    Do not drive for 2 weeks.   Increase activity slowly   Complete by: As directed    Lifting restrictions   Complete by: As directed    Do not lift more than 5 pounds. No excessive bending or twisting.   May shower / Bathe   Complete by: As directed    Remove the dressing for 3 days after surgery.  You may shower, but leave the incision alone.   Remove dressing in 24 hours   Complete by: As directed       Allergies as of 02/24/2024       Reactions   Jardiance [empagliflozin] Other (See Comments)   Diabetic ketoacidosis    Ozempic (0.25 Or 0.5 Mg-dose) [semaglutide(0.25 Or 0.5mg -dos)] Nausea And Vomiting   Penicillins Hives        Medication List     STOP taking these medications    acetaminophen  500 MG tablet Commonly known as: TYLENOL    celecoxib 200 MG capsule Commonly known as: CELEBREX   diclofenac Sodium 1 % Gel Commonly known as: VOLTAREN   HYDROcodone -acetaminophen  5-325 MG tablet Commonly known as: NORCO/VICODIN   methocarbamol  500 MG tablet Commonly known as: ROBAXIN        TAKE these  medications    BARIATRIC MULTIVITAMINS/IRON PO Take 1 tablet by mouth daily.   blood glucose meter kit and supplies Dispense based on patient and insurance preference. Use up to four times daily as directed. (FOR ICD-10 E10.9, E11.9).   cetirizine 10 MG tablet Commonly known as: ZYRTEC Take 10 mg by mouth daily.   [employer] CALCIUM/VIT D SOFT CHEWS PO Take 2 each by mouth 3 (three) times a week.   cyclobenzaprine  10 MG tablet Commonly known as: FLEXERIL  Take 1 tablet (10 mg total) by mouth 3 (three) times daily as needed for muscle spasms.   docusate sodium  100 MG  capsule Commonly known as: COLACE Take 1 capsule (100 mg total) by mouth 2 (two) times daily. What changed:  when to take this reasons to take this   doxycycline 100 MG tablet Commonly known as: ADOXA Take 100 mg by mouth 2 (two) times daily. for toe infection x 10 days   DULoxetine  60 MG capsule Commonly known as: CYMBALTA  Take 60 mg by mouth daily as needed (neuropathy).   gabapentin  100 MG capsule Commonly known as: NEURONTIN  Take 100 mg by mouth 2 (two) times daily as needed (pain).   montelukast  10 MG tablet Commonly known as: SINGULAIR  Take 10 mg by mouth daily.   Mounjaro 5 MG/0.5ML Pen Generic drug: tirzepatide Inject 5 mg into the skin once a week. Takes on Sunday   oxyCODONE -acetaminophen  5-325 MG tablet Commonly known as: PERCOCET/ROXICET Take 1-2 tablets by mouth every 4 (four) hours as needed for moderate pain (pain score 4-6).   Pen Needles 3/16 31G X 5 MM Misc To use with Lantus  pen.   promethazine  25 MG tablet Commonly known as: PHENERGAN  Take 25 mg by mouth every 8 (eight) hours as needed for nausea or vomiting.         Signed: Reyes JONETTA Budge 02/24/2024, 9:22 AM

## 2024-02-24 NOTE — Progress Notes (Signed)
 Physical Therapy Treatment  Patient Details Name: [name] MRN: [id number] DOB: 1961-02-05 Today's Date: 02/24/2024   History of Present Illness Pt is a 63 y/o female who presents s/p L3-L5 PLIF on 02/22/2024. PMH significant for DM, L TKA, cholecystectomy, cervical fusion 2010 with numbness in hands/feet s/p surgery, C4-C5 ACDF 07/08/23.    PT Comments  Pt with poor adherence to back precautions this session. Pt maintained very flexed trunk throughout functional mobility, especially during toileting and hand hygiene at the sink. VC's for improved posture and safety however pt unable to make corrective changes. Pt anticipates d/c home today, but states she will be home alone once she arrives. Reports she feels comfortable managing at home by herself. Reinforced precautions, brace application/wearing schedule, appropriate activity progression, and car transfer. Will continue to follow.     If plan is discharge home, recommend the following: A little help with walking and/or transfers;A little help with bathing/dressing/bathroom;Assistance with cooking/housework;Help with stairs or ramp for entrance;Assist for transportation   Can travel by private vehicle        Equipment Recommendations  BSC/3in1    Recommendations for Other Services  (Pt declines OT evaluation)     Precautions / Restrictions Precautions Precautions: Fall;Back Precaution Booklet Issued: Yes (comment) Recall of Precautions/Restrictions: Intact Precaution/Restrictions Comments: Reviewed handout and pt was cued for precautions during functional mobility. Required Braces or Orthoses: Spinal Brace Spinal Brace: Lumbar corset;Applied in sitting position Restrictions Weight Bearing Restrictions Per Provider Order: No     Mobility  Bed Mobility Overal bed mobility: Needs Assistance Bed Mobility: Rolling, Sidelying to Sit Rolling: Min assist Sidelying to sit: Min assist       General bed mobility comments:  Min assist for full roll and to elevate trunk to full sitting position. Heavy use of rails. Pt reports she can sleep in her recliner at home if needed.    Transfers Overall transfer level: Needs assistance Equipment used: Rolling walker (2 wheels) Transfers: Sit to/from Stand Sit to Stand: Contact guard assist           General transfer comment: VC's for hand placement on seated surface for safety. Pt with very flexed trunk throughout sit<>stand.    Ambulation/Gait Ambulation/Gait assistance: Contact guard assist Gait Distance (Feet): 200 Feet Assistive device: Rolling walker (2 wheels) Gait Pattern/deviations: Step-through pattern, Decreased stride length, Trunk flexed Gait velocity: Decreased Gait velocity interpretation: <1.31 ft/sec, indicative of household ambulator   General Gait Details: VC's for improved posture, closer walker proximity and forward gaze. No gross unsteadiness or LOB noted however pt with very flexed trunk throughout OOB mobility. Able to make mild corrective changes but unable to maintain.   Stairs Stairs:  (Pt declined stair training this session, reporting she feels too weak and fatigued to attempt.)           Wheelchair Mobility     Tilt Bed    Modified Rankin (Stroke Patients Only)       Balance Overall balance assessment: Needs assistance Sitting-balance support: Feet supported, No upper extremity supported Sitting balance-Leahy Scale: Fair     Standing balance support: Bilateral upper extremity supported, During functional activity, Reliant on assistive device for balance Standing balance-Leahy Scale: Poor                              Communication Communication Communication: No apparent difficulties  Cognition Arousal: Alert Behavior During Therapy: WFL for tasks assessed/performed   PT -  Cognitive impairments: No apparent impairments                         Following commands: Intact       Cueing Cueing Techniques: Verbal cues, Gestural cues  Exercises      General Comments        Pertinent Vitals/Pain Pain Assessment Pain Assessment: Faces Faces Pain Scale: Hurts a little bit Pain Location: back/incision Pain Descriptors / Indicators: Operative site guarding, Sore Pain Intervention(s): Limited activity within patient's tolerance, Monitored during session, Repositioned    Home Living                          Prior Function            PT Goals (current goals can now be found in the care plan section) Acute Rehab PT Goals Patient Stated Goal: Be able to manage at home, however wants to be able to go to her niece's home at Christmas where she will have to negotiate stairs. PT Goal Formulation: With patient Time For Goal Achievement: 03/01/24 Potential to Achieve Goals: Good Progress towards PT goals: Progressing toward goals    Frequency    Min 5X/week      PT Plan      Co-evaluation              AM-PAC PT 6 Clicks Mobility   Outcome Measure  Help needed turning from your back to your side while in a flat bed without using bedrails?: A Little Help needed moving from lying on your back to sitting on the side of a flat bed without using bedrails?: A Little Help needed moving to and from a bed to a chair (including a wheelchair)?: A Little Help needed standing up from a chair using your arms (e.g., wheelchair or bedside chair)?: A Little Help needed to walk in hospital room?: A Little Help needed climbing 3-5 steps with a railing? : A Little 6 Click Score: 18    End of Session Equipment Utilized During Treatment: Gait belt;Back brace Activity Tolerance: Patient tolerated treatment well Patient left: in bed;with call bell/phone within reach;with family/visitor present Nurse Communication: Mobility status PT Visit Diagnosis: Unsteadiness on feet (R26.81);Pain Pain - part of body:  (back)     Time: 8964-8944 PT Time Calculation  (min) (ACUTE ONLY): 20 min  Charges:                            Leita Sable, PT, DPT Acute Rehabilitation Services Secure Chat Preferred Office: [phone]    Leita JONETTA Sable 02/24/2024, 11:01 AM

## 2024-02-25 MED FILL — Sodium Chloride IV Soln 0.9%: INTRAVENOUS | Qty: 1000 | Status: AC

## 2024-02-25 MED FILL — Heparin Sodium (Porcine) Inj 1000 Unit/ML: INTRAMUSCULAR | Qty: 30 | Status: AC

## 2024-03-07 ENCOUNTER — Other Ambulatory Visit: Payer: Self-pay | Admitting: Neurosurgery

## 2024-03-07 ENCOUNTER — Ambulatory Visit (HOSPITAL_BASED_OUTPATIENT_CLINIC_OR_DEPARTMENT_OTHER)
Admission: RE | Admit: 2024-03-07 | Discharge: 2024-03-07 | Disposition: A | Discharge status: Home / Self Care | Source: Ambulatory Visit | Attending: Neurosurgery | Admitting: Neurosurgery

## 2024-03-07 DIAGNOSIS — M4316 Spondylolisthesis, lumbar region: Secondary | ICD-10-CM | POA: Insufficient documentation
